# Patient Record
Sex: Female | Born: 1965 | Race: Black or African American | Hispanic: No | Marital: Married | Smoking: Never smoker
Health system: Southern US, Community
[De-identification: ages and names within clinical notes are randomized; demographics above are authoritative.]

## PROBLEM LIST (undated history)

## (undated) VITALS — BP 108/72 | HR 71 | Temp 97.9°F | Resp 16 | Ht 66.5 in | Wt 184.0 lb

## (undated) VITALS — BP 118/85 | HR 75 | Temp 98.1°F | Resp 16 | Ht 66.0 in | Wt 188.0 lb

## (undated) DIAGNOSIS — IMO0001 Reserved for inherently not codable concepts without codable children: Secondary | ICD-10-CM

## (undated) DIAGNOSIS — D649 Anemia, unspecified: Secondary | ICD-10-CM

## (undated) DIAGNOSIS — Z62810 Personal history of physical and sexual abuse in childhood: Secondary | ICD-10-CM

## (undated) DIAGNOSIS — F32A Depression, unspecified: Secondary | ICD-10-CM

## (undated) DIAGNOSIS — F419 Anxiety disorder, unspecified: Secondary | ICD-10-CM

## (undated) DIAGNOSIS — F1020 Alcohol dependence, uncomplicated: Secondary | ICD-10-CM

## (undated) DIAGNOSIS — F329 Major depressive disorder, single episode, unspecified: Secondary | ICD-10-CM

## (undated) DIAGNOSIS — E669 Obesity, unspecified: Secondary | ICD-10-CM

## (undated) DIAGNOSIS — Z9884 Bariatric surgery status: Secondary | ICD-10-CM

## (undated) DIAGNOSIS — J302 Other seasonal allergic rhinitis: Secondary | ICD-10-CM

## (undated) HISTORY — DX: Reserved for inherently not codable concepts without codable children: IMO0001

## (undated) HISTORY — DX: Alcohol dependence, uncomplicated: F10.20

## (undated) HISTORY — DX: Other seasonal allergic rhinitis: J30.2

## (undated) HISTORY — PX: ENDOMETRIAL BIOPSY: SHX622

## (undated) HISTORY — PX: DENTAL SURGERY: SHX609

## (undated) HISTORY — DX: Bariatric surgery status: Z98.84

## (undated) HISTORY — PX: DILATION AND CURETTAGE OF UTERUS: SHX78

## (undated) HISTORY — DX: Personal history of physical and sexual abuse in childhood: Z62.810

## (undated) HISTORY — PX: TUBAL LIGATION: SHX77

## (undated) HISTORY — DX: Obesity, unspecified: E66.9

---

## 1999-09-01 ENCOUNTER — Emergency Department (HOSPITAL_COMMUNITY): Admission: EM | Admit: 1999-09-01 | Discharge: 1999-09-01 | Payer: Self-pay | Admitting: Emergency Medicine

## 2001-12-22 DIAGNOSIS — Z9884 Bariatric surgery status: Secondary | ICD-10-CM

## 2001-12-22 HISTORY — PX: ROUX-EN-Y GASTRIC BYPASS: SHX1104

## 2001-12-22 HISTORY — DX: Bariatric surgery status: Z98.84

## 2002-02-03 ENCOUNTER — Encounter: Payer: Self-pay | Admitting: Internal Medicine

## 2002-02-03 ENCOUNTER — Encounter: Admission: RE | Admit: 2002-02-03 | Discharge: 2002-02-03 | Payer: Self-pay | Admitting: Internal Medicine

## 2004-10-28 ENCOUNTER — Encounter: Admission: RE | Admit: 2004-10-28 | Discharge: 2004-10-28 | Payer: Self-pay | Admitting: Internal Medicine

## 2004-12-19 ENCOUNTER — Other Ambulatory Visit: Admission: RE | Admit: 2004-12-19 | Discharge: 2004-12-19 | Payer: Self-pay | Admitting: Obstetrics and Gynecology

## 2005-11-06 ENCOUNTER — Encounter: Admission: RE | Admit: 2005-11-06 | Discharge: 2005-11-06 | Payer: Self-pay | Admitting: Internal Medicine

## 2006-07-27 ENCOUNTER — Other Ambulatory Visit: Admission: RE | Admit: 2006-07-27 | Discharge: 2006-07-27 | Payer: Self-pay | Admitting: Obstetrics & Gynecology

## 2006-11-09 ENCOUNTER — Encounter: Admission: RE | Admit: 2006-11-09 | Discharge: 2006-11-09 | Payer: Self-pay | Admitting: Internal Medicine

## 2006-11-20 ENCOUNTER — Encounter: Admission: RE | Admit: 2006-11-20 | Discharge: 2006-11-20 | Payer: Self-pay | Admitting: Internal Medicine

## 2007-11-11 ENCOUNTER — Encounter: Admission: RE | Admit: 2007-11-11 | Discharge: 2007-11-11 | Payer: Self-pay | Admitting: Family Medicine

## 2008-05-02 ENCOUNTER — Other Ambulatory Visit: Admission: RE | Admit: 2008-05-02 | Discharge: 2008-05-02 | Payer: Self-pay | Admitting: Obstetrics & Gynecology

## 2008-11-20 ENCOUNTER — Encounter: Admission: RE | Admit: 2008-11-20 | Discharge: 2008-11-20 | Payer: Self-pay | Admitting: Family Medicine

## 2009-11-21 ENCOUNTER — Encounter: Admission: RE | Admit: 2009-11-21 | Discharge: 2009-11-21 | Payer: Self-pay | Admitting: Family Medicine

## 2010-11-22 ENCOUNTER — Encounter: Admission: RE | Admit: 2010-11-22 | Discharge: 2010-11-22 | Payer: Self-pay | Admitting: Internal Medicine

## 2011-01-12 ENCOUNTER — Encounter: Payer: Self-pay | Admitting: Internal Medicine

## 2011-04-14 ENCOUNTER — Inpatient Hospital Stay (HOSPITAL_COMMUNITY)
Admission: AD | Admit: 2011-04-14 | Discharge: 2011-04-18 | DRG: 897 | Disposition: A | Discharge status: Home / Self Care | Payer: Managed Care, Other (non HMO) | Source: Ambulatory Visit | Attending: Psychiatry | Admitting: Psychiatry

## 2011-04-14 ENCOUNTER — Emergency Department (HOSPITAL_COMMUNITY)
Admission: EM | Admit: 2011-04-14 | Discharge: 2011-04-14 | Disposition: A | Discharge status: Other Acute Inpatient Hospital | Payer: Managed Care, Other (non HMO) | Attending: Emergency Medicine | Admitting: Emergency Medicine

## 2011-04-14 DIAGNOSIS — R110 Nausea: Secondary | ICD-10-CM | POA: Insufficient documentation

## 2011-04-14 DIAGNOSIS — F1020 Alcohol dependence, uncomplicated: Secondary | ICD-10-CM

## 2011-04-14 DIAGNOSIS — Z6379 Other stressful life events affecting family and household: Secondary | ICD-10-CM

## 2011-04-14 DIAGNOSIS — F1010 Alcohol abuse, uncomplicated: Secondary | ICD-10-CM | POA: Insufficient documentation

## 2011-04-14 DIAGNOSIS — Z9884 Bariatric surgery status: Secondary | ICD-10-CM | POA: Insufficient documentation

## 2011-04-14 DIAGNOSIS — E669 Obesity, unspecified: Secondary | ICD-10-CM

## 2011-04-14 LAB — RAPID URINE DRUG SCREEN, HOSP PERFORMED
Opiates: NOT DETECTED
Tetrahydrocannabinol: NOT DETECTED

## 2011-04-14 LAB — COMPREHENSIVE METABOLIC PANEL
ALT: 26 U/L (ref 0–35)
AST: 44 U/L — ABNORMAL HIGH (ref 0–37)
Albumin: 3.6 g/dL (ref 3.5–5.2)
Alkaline Phosphatase: 76 U/L (ref 39–117)
GFR calc Af Amer: >60 mL/min (ref >60)
Glucose, Bld: 91 mg/dL (ref 70–99)
Potassium: 4.1 mEq/L (ref 3.5–5.1)
Sodium: 141 mEq/L (ref 135–145)
Total Protein: 7 g/dL (ref 6.0–8.3)

## 2011-04-14 LAB — DIFFERENTIAL
Basophils Absolute: 0.1 10*3/uL (ref 0.0–0.1)
Basophils Relative: 1 % (ref 0–1)
Eosinophils Absolute: 0.1 10*3/uL (ref 0.0–0.7)
Eosinophils Relative: 1 % (ref 0–5)
Lymphocytes Relative: 48 % — ABNORMAL HIGH (ref 12–46)
Lymphs Abs: 3.7 10*3/uL (ref 0.7–4.0)

## 2011-04-14 LAB — CBC
RBC: 4.21 MIL/uL (ref 3.87–5.11)
RDW: 14.3 % (ref 11.5–15.5)

## 2011-04-21 NOTE — Discharge Summary (Signed)
Natalie Mcdaniel, Natalie Mcdaniel              ACCOUNT NO.:  [id number]  MEDICAL RECORD NO.:  [id number]           PATIENT TYPE:  I  LOCATION:  0303                          FACILITY:  [facility]  PHYSICIAN:  Anselm Jungling, MD  DATE OF BIRTH:  03-25-1966  DATE OF ADMISSION:  04/14/2011 DATE OF DISCHARGE:  04/18/2011                              DISCHARGE SUMMARY   IDENTIFYING DATA/REASON FOR ADMISSION:  This was an inpatient psychiatric admission for [place], a 45 year old married African American female who was admitted for treatment of alcohol dependence.  Please refer to the admission note for further details pertaining to the symptoms, circumstances and history that led to her hospitalization. She was given an initial Axis I diagnosis of alcohol dependence.  MEDICAL AND LABORATORY:  The patient was medically and physically assessed by the psychiatric nurse practitioner.  She was in good health without any active or chronic medical problems.  She was continued on Claritin for seasonal allergies.  There were no significant medical issues aside from her medical detoxification from alcohol.  HOSPITAL COURSE:  The patient was admitted to the adult inpatient psychiatric service.  She had presented at the emergency room with an alcohol level 0.297, with urine drug screen negative.  She presented as a well-nourished, normally-developed adult female of above-average intelligence and education.  She readily admitted that alcohol was a problem for her.  She had been on a 5-day drinking binge.  She states that alcohol had been a problem for her over the previous 2 years. Earlier, in her 76s she had an alcohol problem but she stopped drinking at age 52, until about 2 years ago.  She had no prior experience any alcohol rehab program or AA.  She is married with a daughter, and had been working two jobs as an Airline pilot.  She was placed on a Librium withdrawal protocol, and her detoxification was  uneventful.  Actually, on the third hospital day, she declined further doses of Librium, stating that she did not like the way it was making her feel, i.e., too sedated.  Following this, she did not have any further withdrawal symptoms while she was with [place].  She worked closely with case management towards developing an aftercare plan that would support her sobriety and recovery.  She considered the chemical dependency intensive outpatient program, but decided against because it would interfere with her work schedule.  Instead, she made a tentative agreement to meet individually with Charmian Muff, the chemical dependency counselor in our outpatient clinic.  She was appropriate for discharge on the fifth hospital day.  She agreed to the following aftercare plan.  Individual sessions with Charmian Muff, to be arranged.  The patient also encouraged to attend 90 meetings in 90 days.  DISCHARGE MEDICATIONS:  Claritin 10 mg daily.  DISCHARGE DIAGNOSES:  AXIS I: Alcohol dependence. AXIS II: Deferred. AXIS III: Seasonal allergies. AXIS IV: Stressors severe. AXIS V: GAF on discharge 53.     Anselm Jungling, MD     SPB/MEDQ  D:  04/18/2011  T:  04/18/2011  Job:  [id number]  Electronically Signed by Geralyn Flash MD on  04/21/2011 08:56:52 AM

## 2011-04-21 NOTE — H&P (Signed)
Natalie Mcdaniel, Natalie Mcdaniel              ACCOUNT NO.:  [id number]  MEDICAL RECORD NO.:  [id number]           PATIENT TYPE:  I  LOCATION:  0303                          FACILITY:  [facility]  PHYSICIAN:  Anselm Jungling, MD  DATE OF BIRTH:  [birth year]  DATE OF ADMISSION:  04/14/2011 DATE OF DISCHARGE:                      PSYCHIATRIC ADMISSION ASSESSMENT   HPI:  This is a voluntary admission to the services of Dr. Geralyn Flash today, 04/15/2011, for this 45 year old married African-American female, who presented to the ED at [facility], requesting alcohol detox.  She states that over the past few years, her being a wino has increased.  It started out with one or two glasses of wine.  It progressed to a bottle or more every night.  She says that she has been successful stopping for 4 or 5 days at a time.  However, she said she just plain flat out likes to drink.  Sunday, she woke up confused.  She had lost a whole day.  She was yelling at her daughter "Why aren't you in school?" and did not realize that it was Sunday.  She states that she has been under a lot of stress.  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  She also does taxes on the side, and apparently this escalated and just put her over the edge.  She has no formal prior detoxification.  She states that she began drinking at age 22.  She is currently drinking four 1.7 liters bottles 5 days a week for the past month or so.  She states that prior to beginning drinking again two years ago, she had been sober for 15 years.  SOCIAL HISTORY:  She has a BS in Audiological scientist from [employer]. This is her second marriage.  This marriage is 47 years old.  She has two biological daughters, 45 and 56, and a stepson age 30.  Her employment has already been noted.  FAMILY HISTORY:  She states her paternal grandfather was a wino.  He died under a bridge, and her father actually died from complications  of alcoholism, specifically diabetes.  The primary care provider is Dr. Tinnie Gens of [facility].  She has no psychiatric care.  Medical problems are none.  She has been attending the [facility] of a Dr. Mayford Knife on [facility].  She is prescribed Topamax and phentermine for this endeavor.  DRUG ALLERGIES:  No known drug allergies.  POSITIVE PHYSICAL FINDINGS:  She was medically cleared in the ED at [facility].  Her vital signs were stable.  Specifically, she was afebrile at 98 to 98.6.  The pulse was 80 to 102, respirations 18 to 22, and blood pressure was 110/73 to 128/87.  Her alcohol level on admission was 297.  It came down to 200.  Her UDS was negative.  Interestingly enough, her SGOT was only slightly elevated to 44, and she had no abnormalities on CBC.  MENTAL STATUS EXAM:  Today, she is alert and oriented.  She is appropriately groomed, dressed, and nourished in her own clothing.  His speech is of normal rate,  rhythm, and tone.  Her mood is appropriate to the situation.  Thought processes are clear, rational, goal-oriented. She states that she is committed to stopping drinking due to losing a day the other day and being confused.  Judgment and insight are intact. Concentration and memory are intact.  Intelligence is at least average. She specifically denies being suicidal or homicidal.  While she was intoxicated, she reported that she constantly heard music in her head. She could not make it.  She denied suicidal ideation but reported she had attempted it in the past, and there were no details.  The chances are, she was just intoxicated.  Today, she gives no indication at all of responding to internal stimuli, and she has no withdrawal in evidence.  AXIS I:  Alcohol dependence. AXIS II:  Deferred. AXIS III:  Obesity. AXIS IV:  Moderate (occupational, relationship issues). AXIS V:  50.  The patient is interested in Antabuse, and she was told that that  would be discussed in the morning.  Estimated length of stay is 3 to 5 days. She has not taken any of the Librium today.  She said she has refused it, so her level of dependence is different than most.  It is much lighter.     [name], P.A.-C.   ______________________________ Anselm Jungling, MD    MD/MEDQ  D:  04/15/2011  T:  04/15/2011  Job:  [id number]  Electronically Signed by Jaci Lazier ADAMS P.A.-C. on 04/19/2011 03:41:01 PM Electronically Signed by Geralyn Flash MD on 04/21/2011 08:56:50 AM

## 2011-10-23 ENCOUNTER — Other Ambulatory Visit: Payer: Self-pay | Admitting: Internal Medicine

## 2011-10-23 DIAGNOSIS — Z1231 Encounter for screening mammogram for malignant neoplasm of breast: Secondary | ICD-10-CM

## 2011-11-24 ENCOUNTER — Ambulatory Visit
Admission: RE | Admit: 2011-11-24 | Discharge: 2011-11-24 | Disposition: A | Discharge status: Home / Self Care | Payer: Managed Care, Other (non HMO) | Source: Ambulatory Visit | Attending: Internal Medicine | Admitting: Internal Medicine

## 2011-11-24 DIAGNOSIS — Z1231 Encounter for screening mammogram for malignant neoplasm of breast: Secondary | ICD-10-CM

## 2011-11-24 LAB — HM MAMMOGRAPHY: HM Mammogram: NEGATIVE

## 2012-02-05 ENCOUNTER — Ambulatory Visit (INDEPENDENT_AMBULATORY_CARE_PROVIDER_SITE_OTHER): Payer: Managed Care, Other (non HMO) | Admitting: Physician Assistant

## 2012-02-05 ENCOUNTER — Encounter: Payer: Self-pay | Admitting: Physician Assistant

## 2012-02-05 VITALS — BP 128/79 | HR 96 | Temp 98.1°F | Resp 18 | Ht 66.0 in | Wt 188.0 lb

## 2012-02-05 DIAGNOSIS — F32A Depression, unspecified: Secondary | ICD-10-CM | POA: Insufficient documentation

## 2012-02-05 DIAGNOSIS — F418 Other specified anxiety disorders: Secondary | ICD-10-CM

## 2012-02-05 DIAGNOSIS — F1020 Alcohol dependence, uncomplicated: Secondary | ICD-10-CM | POA: Insufficient documentation

## 2012-02-05 DIAGNOSIS — IMO0001 Reserved for inherently not codable concepts without codable children: Secondary | ICD-10-CM | POA: Insufficient documentation

## 2012-02-05 DIAGNOSIS — F329 Major depressive disorder, single episode, unspecified: Secondary | ICD-10-CM | POA: Insufficient documentation

## 2012-02-05 DIAGNOSIS — F339 Major depressive disorder, recurrent, unspecified: Secondary | ICD-10-CM

## 2012-02-05 DIAGNOSIS — F1010 Alcohol abuse, uncomplicated: Secondary | ICD-10-CM

## 2012-02-05 LAB — COMPREHENSIVE METABOLIC PANEL
BUN: 9 mg/dL (ref 6–23)
CO2: 23 mEq/L (ref 19–32)
Calcium: 8.8 mg/dL (ref 8.4–10.5)
Chloride: 105 mEq/L (ref 96–112)
Creat: 0.82 mg/dL (ref 0.50–1.10)
Glucose, Bld: 86 mg/dL (ref 70–99)
Total Bilirubin: 0.3 mg/dL (ref 0.3–1.2)

## 2012-02-05 LAB — CBC WITH DIFFERENTIAL/PLATELET
Basophils Absolute: 0 10*3/uL (ref 0.0–0.1)
Basophils Relative: 1 % (ref 0–1)
HCT: 37 % (ref 36.0–46.0)
Hemoglobin: 12.5 g/dL (ref 12.0–15.0)
Lymphocytes Relative: 42 % (ref 12–46)
Monocytes Absolute: 0.4 10*3/uL (ref 0.1–1.0)
Monocytes Relative: 7 % (ref 3–12)
Neutro Abs: 3 10*3/uL (ref 1.7–7.7)
Neutrophils Relative %: 51 % (ref 43–77)
WBC: 5.9 10*3/uL (ref 4.0–10.5)

## 2012-02-05 LAB — TSH: TSH: 1.182 u[IU]/mL (ref 0.350–4.500)

## 2012-02-05 LAB — LIPID PANEL
Cholesterol: 276 mg/dL — ABNORMAL HIGH (ref 0–200)
HDL: 157 mg/dL (ref >39)
LDL Cholesterol: 99 mg/dL (ref 0–99)
Triglycerides: 101 mg/dL (ref <150)
VLDL: 20 mg/dL (ref 0–40)

## 2012-02-05 MED ORDER — ALPRAZOLAM 0.5 MG PO TBDP: ORAL_TABLET | ORAL | Status: DC

## 2012-02-05 MED ORDER — SERTRALINE HCL 50 MG PO TABS: 50.0000 mg | ORAL_TABLET | Freq: Every day | ORAL | Status: DC
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
## 2012-02-05 NOTE — Patient Instructions (Signed)
Call and schedule with your therapist. Find an AA meeting to attend TODAY.  Attend as many meetings each day as you need to stay sober.

## 2012-02-06 ENCOUNTER — Telehealth: Payer: Self-pay

## 2012-02-06 NOTE — Telephone Encounter (Signed)
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

## 2012-02-06 NOTE — Telephone Encounter (Signed)
Cell phone only  FLMA HAS TO BE FAXED TODAY TO EMPLOYER

## 2012-02-07 NOTE — Telephone Encounter (Signed)
This needs to be done by [facility]. It is on her desk to be completed. Advise pt it may take a few days.

## 2012-02-10 ENCOUNTER — Encounter: Payer: Self-pay | Admitting: *Deleted

## 2012-02-10 ENCOUNTER — Encounter: Payer: Self-pay | Admitting: Physician Assistant

## 2012-02-10 DIAGNOSIS — Z0271 Encounter for disability determination: Secondary | ICD-10-CM

## 2012-02-11 ENCOUNTER — Encounter: Payer: Self-pay | Admitting: Family Medicine

## 2012-02-11 ENCOUNTER — Ambulatory Visit (INDEPENDENT_AMBULATORY_CARE_PROVIDER_SITE_OTHER): Payer: Managed Care, Other (non HMO) | Admitting: Family Medicine

## 2012-02-11 VITALS — BP 114/71 | HR 68 | Temp 97.8°F | Resp 16 | Ht 65.75 in | Wt 188.6 lb

## 2012-02-11 DIAGNOSIS — Z79899 Other long term (current) drug therapy: Secondary | ICD-10-CM

## 2012-02-11 DIAGNOSIS — F419 Anxiety disorder, unspecified: Secondary | ICD-10-CM

## 2012-02-11 DIAGNOSIS — G4700 Insomnia, unspecified: Secondary | ICD-10-CM

## 2012-02-11 DIAGNOSIS — T887XXA Unspecified adverse effect of drug or medicament, initial encounter: Secondary | ICD-10-CM

## 2012-02-11 DIAGNOSIS — F411 Generalized anxiety disorder: Secondary | ICD-10-CM

## 2012-02-11 MED ORDER — ALPRAZOLAM 0.5 MG PO TBDP: ORAL_TABLET | ORAL | Status: DC

## 2012-02-11 NOTE — Patient Instructions (Signed)
Insomnia Insomnia is frequent trouble falling and/or staying asleep. Insomnia can be a long term problem or a short term problem. Both are common. Insomnia can be a short term problem when the wakefulness is related to a certain stress or worry. Long term insomnia is often related to ongoing stress during waking hours and/or poor sleeping habits. Overtime, sleep deprivation itself can make the problem worse. Every little thing feels more severe because you are overtired and your ability to cope is decreased. CAUSES   Stress, anxiety, and depression.   Poor sleeping habits.   Distractions such as TV in the bedroom.   Naps close to bedtime.   Engaging in emotionally charged conversations before bed.   Technical reading before sleep.   Alcohol and other sedatives. They may make the problem worse. They can hurt normal sleep patterns and normal dream activity.   Stimulants such as caffeine for several hours prior to bedtime.   Pain syndromes and shortness of breath can cause insomnia.   Exercise late at night.   Changing time zones may cause sleeping problems (jet lag).  It is sometimes helpful to have someone observe your sleeping patterns. They should look for periods of not breathing during the night (sleep apnea). They should also look to see how long those periods last. If you live alone or observers are uncertain, you can also be observed at a sleep clinic where your sleep patterns will be professionally monitored. Sleep apnea requires a checkup and treatment. Give your caregivers your medical history. Give your caregivers observations your family has made about your sleep.  SYMPTOMS   Not feeling rested in the morning.   Anxiety and restlessness at bedtime.   Difficulty falling and staying asleep.  TREATMENT   Your caregiver may prescribe treatment for an underlying medical disorders. Your caregiver can give advice or help if you are using alcohol or other drugs for  self-medication. Treatment of underlying problems will usually eliminate insomnia problems.   Medications can be prescribed for short time use. They are generally not recommended for lengthy use.   Over-the-counter sleep medicines are not recommended for lengthy use. They can be habit forming.   You can promote easier sleeping by making lifestyle changes such as:   Using relaxation techniques that help with breathing and reduce muscle tension.   Exercising earlier in the day.   Changing your diet and the time of your last meal. No night time snacks.   Establish a regular time to go to bed.   Counseling can help with stressful problems and worry.   Soothing music and white noise may be helpful if there are background noises you cannot remove.   Stop tedious detailed work at least one hour before bedtime.  HOME CARE INSTRUCTIONS   Keep a diary. Inform your caregiver about your progress. This includes any medication side effects. See your caregiver regularly. Take note of:   Times when you are asleep.   Times when you are awake during the night.   The quality of your sleep.   How you feel the next day.  This information will help your caregiver care for you.  Get out of bed if you are still awake after 15 minutes. Read or do some quiet activity. Keep the lights down. Wait until you feel sleepy and go back to bed.   Keep regular sleeping and waking hours. Avoid naps.   Exercise regularly.   Avoid distractions at bedtime. Distractions include watching television or engaging   in any intense or detailed activity like attempting to balance the household checkbook.   Develop a bedtime ritual. Keep a familiar routine of bathing, brushing your teeth, climbing into bed at the same time each night, listening to soothing music. Routines increase the success of falling to sleep faster.   Use relaxation techniques. This can be using breathing and muscle tension release routines. It can  also include visualizing peaceful scenes. You can also help control troubling or intruding thoughts by keeping your mind occupied with boring or repetitive thoughts like the old concept of counting sheep. You can make it more creative like imagining planting one beautiful flower after another in your backyard garden.   During your day, work to eliminate stress. When this is not possible use some of the previous suggestions to help reduce the anxiety that accompanies stressful situations.  MAKE SURE YOU:   Understand these instructions.   Will watch your condition.   Will get help right away if you are not doing well or get worse.  Document Released: 12/05/2000 Document Revised: 08/20/2011 Document Reviewed: 01/05/2008 [facility] Patient Information 2012 [place], [place]   .Anxiety and Panic Attacks Your caregiver has informed you that you are having an anxiety or panic attack. There may be many forms of this. Most of the time these attacks come suddenly and without warning. They come at any time of day, including periods of sleep, and at any time of life. They may be strong and unexplained. Although panic attacks are very scary, they are physically harmless. Sometimes the cause of your anxiety is not known. Anxiety is a protective mechanism of the body in its fight or flight mechanism. Most of these perceived danger situations are actually nonphysical situations (such as anxiety over losing a job). CAUSES  The causes of an anxiety or panic attack are many. Panic attacks may occur in otherwise healthy people given a certain set of circumstances. There may be a genetic cause for panic attacks. Some medications may also have anxiety as a side effect. SYMPTOMS  Some of the most common feelings are:  Intense terror.   Dizziness, feeling faint.   Hot and cold flashes.   Fear of going crazy.   Feelings that nothing is real.   Sweating.   Shaking.   Chest pain or a fast heartbeat  (palpitations).   Smothering, choking sensations.   Feelings of impending doom and that death is near.   Tingling of extremities, this may be from over-breathing.   Altered reality (derealization).   Being detached from yourself (depersonalization).  Several symptoms can be present to make up anxiety or panic attacks. DIAGNOSIS  The evaluation by your caregiver will depend on the type of symptoms you are experiencing. The diagnosis of anxiety or panic attack is made when no physical illness can be determined to be a cause of the symptoms. TREATMENT  Treatment to prevent anxiety and panic attacks may include:  Avoidance of circumstances that cause anxiety.   Reassurance and relaxation.   Regular exercise.   Relaxation therapies, such as yoga.   Psychotherapy with a psychiatrist or therapist.   Avoidance of caffeine, alcohol and illegal drugs.   Prescribed medication.  SEEK IMMEDIATE MEDICAL CARE IF:   You experience panic attack symptoms that are different than your usual symptoms.   You have any worsening or concerning symptoms.  Document Released: 12/08/2005 Document Revised: 08/20/2011 Document Reviewed: 04/11/2010 [facility] Patient Information 2012 [place], [place].

## 2012-02-12 ENCOUNTER — Encounter: Payer: Self-pay | Admitting: Physician Assistant

## 2012-02-12 NOTE — Progress Notes (Signed)
  Subjective:    Patient ID: Natalie Mcdaniel, female    DOB: 1966/12/03, 46 y.o.   MRN: [id number]  HPI  This 46 y.o Cauc female is here today for med RF (Alprazolam) which was prescribed to help  her sleep. She was last seen here on 02/05/12 by Porfirio Oar, PA-C who is addressing issues  pertaining to alcohol abuse and depression. She states the sleep medication is very helpful.    Review of Systems Noncontributory     Objective:   Physical Exam  Vitals reviewed. Constitutional: She is oriented to person, place, and time. She appears well-developed and well-nourished. No distress.  HENT:  Head: Normocephalic and atraumatic.  Eyes: EOM are normal. No scleral icterus.  Pulmonary/Chest: Effort normal. No respiratory distress.  Neurological: She is alert and oriented to person, place, and time. Coordination normal.  Psychiatric: She has a normal mood and affect. Her behavior is normal. Thought content normal.          Assessment & Plan:   1. Anxiety disorder                          RF: Alprazolam 0.5 1 tablet hs prn; suggested pt try 1/2 tab hs                                                                To see if she has good sleep without "hangover" effect  2. Insomnia                                        As above  3. Medication side effect                    Medication dose reduction as noted above

## 2012-02-13 LAB — HM PAP SMEAR: HM Pap smear: NORMAL

## 2012-02-27 ENCOUNTER — Ambulatory Visit: Payer: Managed Care, Other (non HMO) | Admitting: Family Medicine

## 2012-03-04 ENCOUNTER — Encounter: Payer: Self-pay | Admitting: Physician Assistant

## 2012-03-04 ENCOUNTER — Ambulatory Visit (INDEPENDENT_AMBULATORY_CARE_PROVIDER_SITE_OTHER): Payer: Managed Care, Other (non HMO) | Admitting: Physician Assistant

## 2012-03-04 VITALS — BP 109/66 | HR 73 | Temp 98.1°F | Resp 16 | Ht 66.0 in | Wt 190.0 lb

## 2012-03-04 DIAGNOSIS — F1010 Alcohol abuse, uncomplicated: Secondary | ICD-10-CM

## 2012-03-04 DIAGNOSIS — F329 Major depressive disorder, single episode, unspecified: Secondary | ICD-10-CM

## 2012-03-04 NOTE — Patient Instructions (Signed)
Please make every effort to attend AA meetings to help you stay sober.  Please re-start the sertraline (Zoloft).

## 2012-03-04 NOTE — Progress Notes (Signed)
  Subjective:    Patient ID: Natalie Mcdaniel, female    DOB: [birth year], 46 y.o.   MRN: [id number]  HPI  Presents for follow-up of depression and alcoholism after restarting sertraline and alprazolam.  She reports that since her last visit, her only alcohol consumption happened this past weekend (02/28/2012). She started the sertraline 03/02/12 and only took one dose after she experienced swelling in her feet that lasted several days.  She reports it improved with elevation and with going to the gym two days in a row.  She has attended no AA meetings since her last visit because she doesn't have time, she works two jobs, and she just can't make herself go.   Has worked from home x 8 years, at this job x 18 years.  STD denied for the week of work she missed last month when her drinking got out of hand, but FMLA was authorized for episodic absences.  She feels like she's being punished by her immediate supervisor (of only 2 weeks) for her absence and who is trying to make her return to the office for work.  Needs workplace accomodation forms completed so she may continue working from home.  Review of Systems As above.    Objective:   Physical Exam  Vital signs noted. Well-developed, well nourished BF who is awake, alert and oriented, in NAD. Seems a little anxious. HEENT: [place]/AT, PERRL, EOMI.  Sclera and conjunctiva are clear.  Neck: supple, non-tender, no lymphadenopathey, thyromegaly. Heart: RRR, no murmur Lungs: CTA Skin: warm and dry without rash.       Assessment & Plan:  Depression. Alcoholism.  Encouraged to restart the sertraline, as I doubt it caused her LE edema.  Also encouraged attendance at [facility] meetings, and that she make the time for it.  She has refills of Alprazolam and sertraline.  Re-evaluate in 4 weeks.

## 2012-03-19 ENCOUNTER — Telehealth: Payer: Self-pay

## 2012-03-19 NOTE — Telephone Encounter (Signed)
If the forms are not complete, then we need to complete them.

## 2012-03-19 NOTE — Telephone Encounter (Signed)
FOR CHELLE: SARAH FROM THE DISABILITY PLACE WOULD LIKE TO SPEAK WITH YOU REGARDING PT'S DISABILITY. WOULD LIKE TO KNOW WHY IT IS MEDICALLY NECESSARY FOR PT TO WORK FROM HOME PLEASE CALL [phone] OR HER CELL AT [phone]

## 2012-03-19 NOTE — Telephone Encounter (Signed)
Chelle: Pt would like to speak with you regarding her work from home accomodation  As well as her medical condition. 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

## 2012-03-23 NOTE — Telephone Encounter (Signed)
Natalie Mcdaniel, is this something you can check on to see if something else needs to be filled out on pt's disability forms?

## 2012-03-30 ENCOUNTER — Telehealth: Payer: Self-pay
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
## 2012-03-30 NOTE — Telephone Encounter (Signed)
FOR CHELLE: SARAH FROM [employer] CALLED AGAIN TODAY, HADN'T HEARD FROM YOU AND REALLY WOULD LIKE TO SPEAK WITH YOU REGARDING PT'S NEED TO WORK FROM HOME

## 2012-03-31 NOTE — Telephone Encounter (Signed)
Patient notified and plans to recheck tomorrow am to see Chelle.

## 2012-04-01 ENCOUNTER — Ambulatory Visit: Payer: Managed Care, Other (non HMO)

## 2012-04-01 ENCOUNTER — Telehealth: Payer: Self-pay

## 2012-04-08 ENCOUNTER — Ambulatory Visit (INDEPENDENT_AMBULATORY_CARE_PROVIDER_SITE_OTHER): Payer: Managed Care, Other (non HMO) | Admitting: Physician Assistant

## 2012-04-08 ENCOUNTER — Encounter: Payer: Self-pay | Admitting: Physician Assistant

## 2012-04-08 VITALS — BP 125/78 | HR 66 | Temp 97.9°F | Resp 16 | Ht 65.25 in | Wt 186.5 lb

## 2012-04-08 DIAGNOSIS — F341 Dysthymic disorder: Secondary | ICD-10-CM

## 2012-04-08 DIAGNOSIS — F1020 Alcohol dependence, uncomplicated: Secondary | ICD-10-CM

## 2012-04-08 DIAGNOSIS — F419 Anxiety disorder, unspecified: Secondary | ICD-10-CM

## 2012-04-08 DIAGNOSIS — F329 Major depressive disorder, single episode, unspecified: Secondary | ICD-10-CM
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
## 2012-04-21 ENCOUNTER — Encounter (HOSPITAL_COMMUNITY): Payer: Self-pay

## 2012-04-21 ENCOUNTER — Inpatient Hospital Stay (HOSPITAL_COMMUNITY)
Admission: AD | Admit: 2012-04-21 | Discharge: 2012-04-25 | DRG: 897 | Disposition: A | Discharge status: Home / Self Care | Payer: Managed Care, Other (non HMO) | Source: Ambulatory Visit | Attending: Psychiatry | Admitting: Psychiatry

## 2012-04-21 ENCOUNTER — Emergency Department (HOSPITAL_COMMUNITY)
Admission: EM | Admit: 2012-04-21 | Discharge: 2012-04-21 | Disposition: A | Discharge status: Other Acute Inpatient Hospital | Payer: Managed Care, Other (non HMO) | Attending: Emergency Medicine | Admitting: Emergency Medicine

## 2012-04-21 ENCOUNTER — Encounter (HOSPITAL_COMMUNITY): Payer: Self-pay | Admitting: *Deleted

## 2012-04-21 DIAGNOSIS — F1020 Alcohol dependence, uncomplicated: Principal | ICD-10-CM | POA: Diagnosis present

## 2012-04-21 DIAGNOSIS — F329 Major depressive disorder, single episode, unspecified: Secondary | ICD-10-CM | POA: Insufficient documentation

## 2012-04-21 DIAGNOSIS — F411 Generalized anxiety disorder: Secondary | ICD-10-CM | POA: Insufficient documentation

## 2012-04-21 DIAGNOSIS — IMO0002 Reserved for concepts with insufficient information to code with codable children: Secondary | ICD-10-CM

## 2012-04-21 DIAGNOSIS — F1010 Alcohol abuse, uncomplicated: Secondary | ICD-10-CM | POA: Insufficient documentation

## 2012-04-21 DIAGNOSIS — F3289 Other specified depressive episodes: Secondary | ICD-10-CM | POA: Insufficient documentation

## 2012-04-21 HISTORY — DX: Depression, unspecified: F32.A

## 2012-04-21 HISTORY — DX: Major depressive disorder, single episode, unspecified: F32.9

## 2012-04-21 HISTORY — DX: Anxiety disorder, unspecified: F41.9

## 2012-04-21 LAB — COMPREHENSIVE METABOLIC PANEL
BUN: 11 mg/dL (ref 6–23)
CO2: 28 mEq/L (ref 19–32)
Calcium: 8.8 mg/dL (ref 8.4–10.5)
Chloride: 95 mEq/L — ABNORMAL LOW (ref 96–112)
Creatinine, Ser: 0.86 mg/dL (ref 0.50–1.10)
GFR calc Af Amer: >90 mL/min (ref >90)
GFR calc non Af Amer: 79 mL/min — ABNORMAL LOW (ref >90)
Total Bilirubin: 0.3 mg/dL (ref 0.3–1.2)

## 2012-04-21 LAB — RAPID URINE DRUG SCREEN, HOSP PERFORMED
Cocaine: NOT DETECTED
Opiates: NOT DETECTED
Tetrahydrocannabinol: NOT DETECTED

## 2012-04-21 LAB — CBC
HCT: 39 % (ref 36.0–46.0)
MCH: 31.8 pg (ref 26.0–34.0)
MCV: 92.6 fL (ref 78.0–100.0)
Platelets: 418 10*3/uL — ABNORMAL HIGH (ref 150–400)
RBC: 4.21 MIL/uL (ref 3.87–5.11)
WBC: 6.5 10*3/uL (ref 4.0–10.5)

## 2012-04-21 LAB — ETHANOL
Alcohol, Ethyl (B): 232 mg/dL — ABNORMAL HIGH (ref 0–11)
Alcohol, Ethyl (B): 154 mg/dL — ABNORMAL HIGH (ref 0–11)

## 2012-04-21 MED ORDER — IBUPROFEN 600 MG PO TABS: 600.0000 mg | ORAL_TABLET | Freq: Three times a day (TID) | ORAL | Status: DC | PRN

## 2012-04-21 MED ORDER — TRAZODONE HCL 50 MG PO TABS
50.0000 mg | ORAL_TABLET | Freq: Every evening | ORAL | Status: DC | PRN
  Administered 2012-04-21 – 2012-04-24 (×4): 50 mg via ORAL
  Filled 2012-04-21 (×3): qty 1

## 2012-04-21 MED ORDER — ONDANSETRON HCL 4 MG PO TABS: 4.0000 mg | ORAL_TABLET | Freq: Three times a day (TID) | ORAL | Status: DC | PRN

## 2012-04-21 MED ORDER — VITAMIN B-1 100 MG PO TABS
100.0000 mg | ORAL_TABLET | Freq: Every day | ORAL | Status: DC
Start: 2012-04-21 — End: 2012-04-21
  Administered 2012-04-21: 100 mg via ORAL
  Filled 2012-04-21: qty 1

## 2012-04-21 MED ORDER — ALUM & MAG HYDROXIDE-SIMETH 200-200-20 MG/5ML PO SUSP: 30.0000 mL | ORAL | Status: DC | PRN

## 2012-04-21 MED ORDER — MAGNESIUM HYDROXIDE 400 MG/5ML PO SUSP
30.0000 mL | Freq: Every day | ORAL | Status: DC | PRN
  Administered 2012-04-25: 30 mL via ORAL

## 2012-04-21 MED ORDER — VITAMIN B-1 100 MG PO TABS
100.0000 mg | ORAL_TABLET | Freq: Every day | ORAL | Status: DC
  Administered 2012-04-22 – 2012-04-25 (×4): 100 mg via ORAL
  Filled 2012-04-21 (×6): qty 1

## 2012-04-21 MED ORDER — CHLORDIAZEPOXIDE HCL 25 MG PO CAPS
25.0000 mg | ORAL_CAPSULE | ORAL | Status: AC
  Administered 2012-04-24 – 2012-04-25 (×2): 25 mg via ORAL
  Filled 2012-04-21 (×2): qty 1

## 2012-04-21 MED ORDER — THIAMINE HCL 100 MG/ML IJ SOLN: 100.0000 mg | Freq: Every day | INTRAMUSCULAR | Status: DC

## 2012-04-21 MED ORDER — CHLORDIAZEPOXIDE HCL 25 MG PO CAPS
25.0000 mg | ORAL_CAPSULE | Freq: Three times a day (TID) | ORAL | Status: AC
  Administered 2012-04-23 (×2): 25 mg via ORAL
  Filled 2012-04-21 (×4): qty 1

## 2012-04-21 MED ORDER — HYDROXYZINE HCL 25 MG PO TABS: 25.0000 mg | ORAL_TABLET | Freq: Four times a day (QID) | ORAL | Status: AC | PRN

## 2012-04-21 MED ORDER — CHLORDIAZEPOXIDE HCL 25 MG PO CAPS
25.0000 mg | ORAL_CAPSULE | Freq: Four times a day (QID) | ORAL | Status: AC
  Administered 2012-04-21 – 2012-04-23 (×6): 25 mg via ORAL
  Filled 2012-04-21 (×6): qty 1

## 2012-04-21 MED ORDER — ADULT MULTIVITAMIN W/MINERALS CH
1.0000 | ORAL_TABLET | Freq: Every day | ORAL | Status: DC
  Administered 2012-04-21 – 2012-04-25 (×5): 1 via ORAL
  Filled 2012-04-21 (×9): qty 1

## 2012-04-21 MED ORDER — ONDANSETRON 4 MG PO TBDP: 4.0000 mg | ORAL_TABLET | Freq: Four times a day (QID) | ORAL | Status: AC | PRN

## 2012-04-21 MED ORDER — LORAZEPAM 1 MG PO TABS
1.0000 mg | ORAL_TABLET | Freq: Four times a day (QID) | ORAL | Status: DC | PRN
  Administered 2012-04-21: 1 mg via ORAL
  Filled 2012-04-21: qty 1

## 2012-04-21 MED ORDER — LORAZEPAM 1 MG PO TABS
0.0000 mg | ORAL_TABLET | Freq: Four times a day (QID) | ORAL | Status: DC
  Administered 2012-04-21: 1 mg via ORAL
  Filled 2012-04-21: qty 1

## 2012-04-21 MED ORDER — LOPERAMIDE HCL 2 MG PO CAPS: 2.0000 mg | ORAL_CAPSULE | ORAL | Status: AC | PRN

## 2012-04-21 MED ORDER — LORAZEPAM 2 MG/ML IJ SOLN: 1.0000 mg | Freq: Four times a day (QID) | INTRAMUSCULAR | Status: DC | PRN

## 2012-04-21 MED ORDER — LORAZEPAM 1 MG PO TABS: 0.0000 mg | ORAL_TABLET | Freq: Two times a day (BID) | ORAL | Status: DC

## 2012-04-21 MED ORDER — THIAMINE HCL 100 MG/ML IJ SOLN: 100.0000 mg | Freq: Once | INTRAMUSCULAR | Status: DC

## 2012-04-21 MED ORDER — CHLORDIAZEPOXIDE HCL 25 MG PO CAPS: 25.0000 mg | ORAL_CAPSULE | Freq: Every day | ORAL | Status: DC

## 2012-04-21 MED ORDER — FOLIC ACID 1 MG PO TABS
1.0000 mg | ORAL_TABLET | Freq: Every day | ORAL | Status: DC
  Administered 2012-04-21: 1 mg via ORAL
  Filled 2012-04-21: qty 1

## 2012-04-21 MED ORDER — CHLORDIAZEPOXIDE HCL 25 MG PO CAPS: 25.0000 mg | ORAL_CAPSULE | Freq: Four times a day (QID) | ORAL | Status: AC | PRN

## 2012-04-21 MED ORDER — ACETAMINOPHEN 325 MG PO TABS: 650.0000 mg | ORAL_TABLET | Freq: Four times a day (QID) | ORAL | Status: DC | PRN

## 2012-04-21 MED ORDER — ADULT MULTIVITAMIN W/MINERALS CH
1.0000 | ORAL_TABLET | Freq: Every day | ORAL | Status: DC
  Administered 2012-04-21: 1 via ORAL
  Filled 2012-04-21: qty 1

## 2012-04-21 MED ORDER — ZOLPIDEM TARTRATE 5 MG PO TABS: 5.0000 mg | ORAL_TABLET | Freq: Every evening | ORAL | Status: DC | PRN

## 2012-04-21 NOTE — ED Notes (Signed)
Pt states that she started drinking about 2 weeks after being dry for one week. Pt had Budlight mixed with a V8,  22 oz(chilada) at 8:30 am 04/21/12. Pt states that she needs to have a drink when she wakes up each morning. Pt does not know when the last time she ate.

## 2012-04-21 NOTE — ED Notes (Signed)
Pt complains of anxiety says she feels shakey.

## 2012-04-21 NOTE — ED Provider Notes (Signed)
The patient has been accepted to behavioral health  Dayton Bailiff, MD 04/21/12 1843

## 2012-04-21 NOTE — ED Notes (Signed)
Pt has been on a drinking binge x 14 days and now wants help with detox from etoh.  Denies SI/HI.  Already accepted at [facility] waiting for bed assignment/availibility.
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
## 2012-04-21 NOTE — ED Notes (Signed)
Security wanded patient and belongings. 2 bags of belongings labeled and placed behind nursing station beside room 15.
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
## 2012-04-21 NOTE — Progress Notes (Signed)
Vol admit to the 300 hall requesting detox from alcohol.  Pt reports drinking a 12 pack and (4) 40 oz malt liquors daily.  She states has been on a 2 week binge.  Prior she had been sober for about a week.  She says she does not use any other drugs(UDS was negative).  She denies SI/HI.  Pt states she just wants help getting off the alcohol.  Pt had gastric bypass surgery in 2003.  Pt has no major medical issues.  She states she is hypertensive when detoxing.  Her BP was elevated on admission.  She was pleasant/cooperative during the admission.  She complains of her skin tingling.  Orders obtained to start Librium Protocol.  Safety maintained with q15 minute checks.

## 2012-04-21 NOTE — ED Notes (Addendum)
Pt accepted by Lynann Bologna, NP to [facility] ([phone]). Completed support documentation and updated EDP.

## 2012-04-21 NOTE — Tx Team (Signed)
Initial Interdisciplinary Treatment Plan  PATIENT STRENGTHS: (choose at least two) Average or above average intelligence Capable of independent living General fund of knowledge Motivation for treatment/growth Supportive family/friends  PATIENT STRESSORS: Marital or family conflict Substance abuse   PROBLEM LIST: Problem List/Patient Goals Date to be addressed Date deferred Reason deferred Estimated date of resolution  Alcohol abuse      Marital conflict      Depression                                           DISCHARGE CRITERIA:  Ability to meet basic life and health needs Improved stabilization in mood, thinking, and/or behavior Motivation to continue treatment in a less acute level of care Verbal commitment to aftercare and medication compliance Withdrawal symptoms are absent or subacute and managed without 24-hour nursing intervention  PRELIMINARY DISCHARGE PLAN: Attend 12-step recovery group Outpatient therapy Participate in family therapy Return to previous living arrangement  PATIENT/FAMIILY INVOLVEMENT: This treatment plan has been presented to and reviewed with the patient, Natalie Mcdaniel, and/or family member.  The patient and family have been given the opportunity to ask questions and make suggestions.  Jesus Genera [facility] 04/21/2012, 8:04 PM

## 2012-04-22 DIAGNOSIS — F1020 Alcohol dependence, uncomplicated: Principal | ICD-10-CM

## 2012-04-22 MED ORDER — PANTOPRAZOLE SODIUM 40 MG PO TBEC
40.0000 mg | DELAYED_RELEASE_TABLET | Freq: Every day | ORAL | Status: DC
  Administered 2012-04-22 – 2012-04-25 (×4): 40 mg via ORAL
  Filled 2012-04-22 (×6): qty 1

## 2012-04-22 MED ORDER — GATORADE (BH)
240.0000 mL | Freq: Four times a day (QID) | ORAL | Status: DC
  Administered 2012-04-22 – 2012-04-25 (×9): 240 mL via ORAL
  Filled 2012-04-22: qty 480
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
## 2012-04-22 NOTE — Progress Notes (Signed)
Pt.  Isolative.  Sitting in day room putting together a puzzle.  Minimal interaction with peers.  Denies SI/HI and denies A/V hallucinations.  Support given.

## 2012-04-22 NOTE — Progress Notes (Signed)
[facility] Group Notes:  (Counselor/Nursing/MHT/Case Management/Adjunct)  04/22/2012 4:59 PM  Type of Therapy:  Group Therapy  Participation Level:  Active  Participation Quality:  Appropriate  Affect:  Appropriate  Cognitive:  Alert and Oriented  Insight:  Limited  Engagement in Group:  Limited  Engagement in Therapy:  Limited  Modes of Intervention:  Clarification and Socialization  Summary of Progress/Problems:  Natalie Mcdaniel participated in activity in which patients choose photographs to represent what their life would look and feel like were it in balance and another for out of balance. The phot she chose to represent life out of balance shoes a person looking down with a head covered in sticky notes to which patient shared about being responsible for everything in home.  Another photo showed a flower opening to represent balance in life.    [name] 04/22/2012, 5:04 PM

## 2012-04-22 NOTE — Discharge Planning (Signed)
New patient is here for detox from alcohol.  Been through detox once before here 2 yrs ago.  Relapsed about a month after that.  Quit going to meetings.  Previous to that she had put together 10 years of sobriety with the help of mtgs and sponsor.  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  Plans to return home, follow up with mtgs.
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
## 2012-04-22 NOTE — [facility] Suicide Risk Assessment (Signed)
Suicide Risk Assessment  Admission Assessment     Demographic factors:  Assessment Details Time of Assessment: Admission Information Obtained From: Patient Current Mental Status:  Current Mental Status:  (n/a) Loss Factors:  Loss Factors: Financial problems / change in socioeconomic status Historical Factors:  Historical Factors:  (substance abuse) Risk Reduction Factors:  Risk Reduction Factors: Responsible for children under 48 years of age;Sense of responsibility to family;Positive social support;Living with another person, especially a relative  CLINICAL FACTORS:   Depression:   Anhedonia Comorbid alcohol abuse/dependence Hopelessness Insomnia Severe Dysthymia Alcohol/Substance Abuse/Dependencies More than one psychiatric diagnosis Previous Psychiatric Diagnoses and Treatments  COGNITIVE FEATURES THAT CONTRIBUTE TO RISK:  Thought constriction (tunnel vision)    SUICIDE RISK:   Minimal: No identifiable suicidal ideation.  Patients presenting with no risk factors but with morbid ruminations; may be classified as minimal risk based on the severity of the depressive symptoms  PLAN OF CARE: Pt has been drinking beer for two week binge.  These binges are repetitive.  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  She just finished and went on recent binge.  She has a flat affect, some tears and says she just wanted to stop drinking.  She has been in [facility] before and did not have a long period of sobriety before she began binge drinking again.she tries to stop for ~ 4 days and the withdrawal symptoms begin and she has to start drinking.  She has a 40 yo daughter at home who 'fends for herself' when pt [mom] is drinking.  Pt will participate in groups and individual therapy.  She will report any severe withdrawal symptoms., any suicidal thoughts, and any adverse reactions to medications.  She needs a soft diet because she has not been eating for two weeks and became nauseous after  trying to eat lunch.  She will need referral to outpatient psychiatrist /rehab program to renforce intentions of sobriety once ready for discharge  Natalie Mcdaniel 04/22/2012, 3:15 PM

## 2012-04-22 NOTE — Progress Notes (Signed)
Patient ID: Natalie Mcdaniel, female   DOB: 1965-12-27, 46 y.o.   MRN: [id number] Pt. Awake, alert, NAD.  AFfect is bright, mood is stable, though slightly depressed.  Reviewed nursing care plan.  Pt. Denies SI/HI.  States she saw a visual hallucination last night, a ghost.  The hallucination resolved when she turned on the bathroom light and left it on.  She reports pins/needles withdrawal symptoms.  She has been attending afternoon groups.

## 2012-04-23 NOTE — Progress Notes (Signed)
Pt quiet but pleasant on approach, sitting in dayroom putting puzzle together.  No complaints voiced.  Pt denies SI/HI/hallucinations.  Support and encouragement offered, will continue to monitor.

## 2012-04-23 NOTE — Progress Notes (Signed)
[facility] Group Notes:  (Counselor/Nursing/MHT/Case Management/Adjunct)  04/23/2012 4:24 PM  Type of Therapy:  Group Therapy at 11am  Participation Level:  Active  Participation Quality:  Appropriate, Attentive and Sharing  Affect:  Appropriate  Cognitive:  Appropriate  Insight:  Good  Engagement in Group:  Good  Engagement in Therapy:  Good  Modes of Intervention:  Education and Support  Summary of Progress/Problems:  Denise was attentive to discussion on Post Acute Withdrawal Syndrome (PAWS) although somewhat reactive .    [name] 04/23/2012, 4:24 PMBHH Group Notes:  (Counselor/Nursing/MHT/Case Management/Adjunct)  04/23/2012 4:24 PM  Type of Therapy:  Group Therapy at 1:15  Participation Level:  Active  Participation Quality:  Appropriate  Affect:  Appropriate  Cognitive:  Appropriate  Insight:  Good  Engagement in Group:  Good  Engagement in Therapy:  Good  Modes of Intervention:  Clarification, Support and Exploration  Summary of Progress/Problems:  Sabriyah shared that she neither uses alcohol to relieve tension or increase excitement as she is "caught in cycle to have first drink to get rid of the last."  Arieliz was supportive of others who willingly shared their truth. Aysha also told group she is excited about beginning IOP program next week.     [name] 04/23/2012, 4:38 PM

## 2012-04-23 NOTE — Progress Notes (Signed)
Slept good w/meds lasst nite, appetite is improving, energy level is normal, ability to pay attention is good, depressed 3/10 and hopeless 1/10, denies SI or HI, attending group and interacting w/peers in the dayroom, eating in the DR and taking meds as ordered by MD. q32min safety checks continues and support offered Safety maintained

## 2012-04-23 NOTE — Treatment Plan (Signed)
Interdisciplinary Treatment Plan Update (Adult)  Date: 04/23/2012  Time Reviewed: 8:27 AM   Progress in Treatment: Attending groups: Yes Participating in groups: Yes Taking medication as prescribed: Yes Tolerating medication: Yes   Family/Significant other contact made:  No Patient understands diagnosis:  Yes  As evidenced by asking for help with alcohol withdrawal Discussing patient identified problems/goals with staff:  Yes See below Medical problems stabilized or resolved:  Yes Denies suicidal/homicidal ideation: Yes In AM group Issues/concerns per patient self-inventory:  Yes  Some diarrhea Other:  New problem(s) identified: N/A  Reason for Continuation of Hospitalization: Depression Medication stabilization Withdrawal symptoms  Interventions implemented related to continuation of hospitalization:  Librium taper  Cocktail to address pain  Encourage group attendance and participation  Additional comments:  Estimated length of stay: 1-2 days Discharge Plan: Return home, follow up CD IOP New goal(s): N/A  Review of initial/current patient goals per problem list:   1.  Goal(s):Safely detox from alcohol  Met:  No  Target date:5/5  As evidenced ZO:XWRU of 0, stable vitals   2.  Goal (s):Decrease depression  Met:  Yes  Target date:5/3  As evidenced [id number] rates her depression at a 3 today-states that is baseline  3.  Goal(s): Identify comprehensive sobriety plan  Met:  Yes  Target date:5/3  As evidenced [id number] is committed to attending the [facility] IOP program  4.  Goal(s):  Met:  No  Target date:  As evidenced by:  Attendees: Patient:     Family:     Physician:  Lynann Bologna  NP 04/23/2012 8:27 AM   Nursing:  Alease Frame  04/23/2012 8:27 AM   Case Manager:  Richelle Ito, LCSW 04/23/2012 8:27 AM   Counselor:  Ronda Fairly, LCSWA 04/23/2012 8:27 AM   Other:     Other:     Other:     Other:      Scribe for Treatment Team:   Ida Rogue,  04/23/2012 8:27 AM

## 2012-04-23 NOTE — Progress Notes (Signed)
[facility] Case Management Discharge Plan:  Will you be returning to the same living situation after discharge: Yes,  home At discharge, do you have transportation home?:Yes,  family Do you have the ability to pay for your medications:Yes,  insurance  Interagency Information:     Release of information consent forms completed and in the chart;  Patient's signature needed at discharge.  Patient to Follow up at:  Follow-up Information    Follow up with [facility] on 04/27/2012. (8:30 AM with Carmelia Bake is planning on seeing you Friday afternoon after her group to confirm your  time on Tuesday, and possibly change it to Monday.  Just go with whatever the 2 of you come up with.)    Contact information:   [address]  [facility] [phone]         Patient denies SI/HI:   Yes,  yes    Safety Planning and Suicide Prevention discussed:  Yes,  yes  Barrier to discharge identified:No.  Summary and Recommendations:   Ida Rogue 04/23/2012, 2:52 PM

## 2012-04-23 NOTE — Progress Notes (Signed)
Pt laying in bed resting with eyes closed. Respirations even and unlabored. No distress noted.  

## 2012-04-24 DIAGNOSIS — F3289 Other specified depressive episodes: Secondary | ICD-10-CM

## 2012-04-24 DIAGNOSIS — F329 Major depressive disorder, single episode, unspecified: Secondary | ICD-10-CM

## 2012-04-24 NOTE — Progress Notes (Signed)
Patient ID: Natalie Mcdaniel, female   DOB: 1966-05-09, 46 y.o.   MRN: [id number] Pt. Awake, alert, NAD.  Affect: blunted, cautious  Mood: anxious  Reviewed nursing care plan.  Pt. Denies SI/HI/AVH.  Pt. Refused librium this Am due to concerns about lingering dizziness from "medication last night." (trazodone).  Pt. Denies any withdrawal symptoms.  Pt. Initially refused protonix, this writer explained the indications for protonix but patient continued to decline the med.  Pt. C/o abd pain after lunch and accepted the protonix at that time.  This Clinical research associate recommended she take her protonix at the scheduled time tomorrow to prevent this problem and patient verbalized understanding and agreement.  Pt. Attended drug abuse group and was somewhat resistant to the group leader during the discussion but this writer has the impression that she listened closely to the information provided during the group session.

## 2012-04-24 NOTE — Progress Notes (Signed)
[facility] Family/Significant Other Suicide Prevention Education  Suicide Prevention Education:  Education Completed; Pt's husband [phone] Natalie Mcdaniel has been identified by the patient as the family member/significant other with whom the patient will be residing, and identified as the person(s) who will aid the patient in the event of a mental health crisis (suicidal ideations/suicide attempt).  With written consent from the patient, the family member/significant other has been provided the following suicide prevention education, prior to the and/or following the discharge of the patient.  The suicide prevention education provided includes the following:  Suicide risk factors  Suicide prevention and interventions  National Suicide Hotline telephone number  [facility] assessment telephone number  [facility] Emergency Assistance 911  [facility] and/or Residential Mobile Crisis Unit telephone number  Request made of family/significant other to:  Remove weapons (e.g., guns, rifles, knives), all items previously/currently identified as safety concern.    Remove drugs/medications (over-the-counter, prescriptions, illicit drugs), all items previously/currently identified as a safety concern.  The family member/significant other verbalizes understanding of the suicide prevention education information provided.  The family member/significant other agrees to remove the items of safety concern listed above.  Natalie Mcdaniel L 04/24/2012, 10:06 AM

## 2012-04-24 NOTE — Progress Notes (Signed)
Pt attends groups and has minimal participation. Pt is isolative and usually stays to herself. Pt was offered support and encouragement. Pt receptive to treatment and safety maintained on the unit.

## 2012-04-24 NOTE — Progress Notes (Signed)
Patient ID: Natalie Mcdaniel, female   DOB: 1966-10-08, 46 y.o.   MRN: [id number]  [facility] Group Notes:  (Counselor/Nursing/MHT/Case Management/Adjunct)  04/24/2012 1:15 PM  Type of Therapy:  Group Therapy, Dance/Movement Therapy   Participation Level:  Minimal  Participation Quality:  Appropriate  Affect:  Appropriate  Cognitive:  Appropriate  Insight:  Limited  Engagement in Group:  Limited  Engagement in Therapy:  Limited  Modes of Intervention:  Clarification, Problem-solving, Role-play, Socialization and Support  Summary of Progress/Problems:  Therapist invited group to draw themselves climbing a mountain to include obstacles, challenges and the supplies that will enable them to move up the mountain.  Therapist asked group to discuss the challenges in order to assess where they are presently and where they want to be.  Pt. stated " My obstacles are wine bottles.  My husband and children are helping me get to the top of the mountain. Pt. seems to be aware of obstacle in her life and the necessary tools it will take to overcome barriers.    Rhunette Croft

## 2012-04-24 NOTE — Progress Notes (Signed)
Took over Pt care at 2300, Pt in room resting in bed, respirations even and unlabored.  No distress noted.  Will continue to monitor.

## 2012-04-24 NOTE — Progress Notes (Signed)
Patient ID: Natalie Mcdaniel, female   DOB: 03-25-1966, 46 y.o.   MRN: [id number] Pt. attended and participated in aftercare planning group. Pt. accepted information on suicide prevention, warning signs to look for with suicide and crisis line numbers to use. The pt. agreed to call crisis line numbers if having warning signs or having thoughts of suicide. Pt. listed their current anxiety level as 1 and depression as 1 on scale of 1 to 10 with 10 being the high.

## 2012-04-24 NOTE — Progress Notes (Signed)
Patient ID: Natalie Mcdaniel, female   DOB: 04/16/1966, 46 y.o.   MRN: [id number] Fully alert female, pleasant, cooperative. Appropriate dress and grooming. Tolerating detox well with the withdrawal symptoms. She is requesting to go home tomorrow and has no suicidal thoughts. Taking medications as prescribed.  Plan: Discharge in the morning with detox is completed.

## 2012-04-25 DIAGNOSIS — F1010 Alcohol abuse, uncomplicated: Secondary | ICD-10-CM

## 2012-04-25 MED ORDER — TRAZODONE HCL 50 MG PO TABS: ORAL_TABLET | ORAL | Status: DC

## 2012-04-25 MED ORDER — LORATADINE 10 MG PO TABS: 10.0000 mg | ORAL_TABLET | Freq: Every day | ORAL | Status: DC

## 2012-04-25 MED ORDER — SERTRALINE HCL 50 MG PO TABS: 50.0000 mg | ORAL_TABLET | Freq: Every day | ORAL | Status: DC

## 2012-04-25 MED ORDER — ONE-DAILY MULTI VITAMINS PO TABS: 1.0000 | ORAL_TABLET | Freq: Every day | ORAL | Status: DC

## 2012-04-25 MED ORDER — THIAMINE HCL 100 MG PO TABS: 100.0000 mg | ORAL_TABLET | Freq: Every day | ORAL | Status: DC

## 2012-04-25 MED ORDER — IRON PO TABS: 1.0000 | ORAL_TABLET | Freq: Every day | ORAL | Status: DC

## 2012-04-25 NOTE — Progress Notes (Signed)
Pt was D/C home. Pt rx and follow-up visits were reviewed and pt verbalized understanding. Pt denies SI/HI. Pt belongings were returned.
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
## 2012-04-25 NOTE — Progress Notes (Signed)
[facility] Case Management Discharge Plan:  Will you be returning to the same living situation after discharge: Yes,   At discharge, do you have transportation home?:Yes, Pt. Will be transported home by her husband [name] [phone] Do you have the ability to pay for your medications:Yes,    Interagency Information:     Release of information consent forms completed and in the chart;  Patient's signature needed at discharge.  Patient to Follow up at:  Follow-up Information    Follow up with [facility] on 04/27/2012. (8:30 AM with Carmelia Bake is planning on seeing you Friday afternoon after her group to confirm your  time on Tuesday, and possibly change it to Monday.  Just go with whatever the 2 of you come up with.)    Contact information:   [address]  [facility] [phone]         Patient denies SI/HI:   Yes,      Safety Planning and Suicide Prevention discussed:  Yes,    Barrier to discharge identified: none  Summary and Recommendations: Pt. Will follow up with appoints and will be picked up at 1;30 p.m by her husband. Pt. Was given SI information and crisis hotline numbers and agreed to use them if needed.   Lamar Blinks Jeneen 04/25/2012, 10:00 AM

## 2012-04-25 NOTE — Progress Notes (Signed)
[facility] Group Notes:  (Counselor/Nursing/MHT/Case Management/Adjunct)  04/25/2012 11:09 AM  Type of Therapy:  Non Denominational   Participation Level:  Active  Participation Quality:  Appropriate, Sharing and Supportive  Affect:  Appropriate  Cognitive:  Alert and Appropriate  Insight:  Good  Engagement in Group:  Good  Engagement in Therapy:  Group is not considered therapy  Modes of Intervention:  Non Denominational   Summary of Progress/Problems:Pt attended group and actively participated. Pt shared about how she related to the article passed out called "Self-Examination at Depth". Pt stated she spiritually could not really relate to the article besides the fact that at every point in life she reaches to a higher power and has to look within herself for strength to change.      Dalia Heading 04/25/2012, 11:09 AM

## 2012-04-25 NOTE — Progress Notes (Signed)
Patient ID: Natalie Mcdaniel, female   DOB: [birth year], 46 y.o.   MRN: [id number]  [facility] Group Notes:  (Counselor/Nursing/MHT/Case Management/Adjunct)  04/25/2012 1:15 PM  Type of Therapy:  Group Therapy, Dance/Movement Therapy   Participation Level:  Did Not Attend     Rhunette Croft

## 2012-04-27 ENCOUNTER — Encounter (HOSPITAL_COMMUNITY): Payer: Self-pay | Admitting: Psychology

## 2012-04-27 NOTE — Progress Notes (Signed)
Patient ID: Natalie Mcdaniel, female   DOB: Feb 27, 1966, 46 y.o.   MRN: [id number] Orientation to CD-IOP: The patient is a 46 yo married, African-American, female seeking entry into the CD-IOP. She was discharged from detox at [facility] on Sunday and referred to this program. The patient was in this hospital approximately 1 year ago for her first alcohol detox and admitted she was able to remain sober for only about 3 months before she began drinking again. The patient lives here in [place] with her husband and their 94 yo daughter. Her 36 yo daughter, by a previous relationship, lives in [place], [place] and is scheduled to graduate from [employer] this coming Friday, the 10th. The patient reported first drinking alcohol at age 28 on her high school graduation day. She reported her alcohol use increased over the years, but it wasn't until around 2010 that she experienced a dramatic increase in her alcohol use. The patient reported she and a friend went on a cruise and were served wine at dinner. She really enjoyed the wine and when she got home she began buying it. She admitted she was drinking more and more and buying a case at a time. She reported as her drinking increased, many things in her life, like church and going to the gym 6 days a week were slowly eliminated. The patient reported she is monitored by Theora Gianotti, PA at Urgent Care on [address] and had been prescribed Zoloft, Trazadone, and Xanax. She noted she had stopped taking the Xanax and questioned whether she should continue with the other medications. I encouraged her to remain on the other 2 medications until she could meet with the Medical Director next Friday to discuss her case. The patient reported she works at home for [employer] and this makes it easy to drink during the day. The patient reported she had grown up in [place] and her father was an alcoholic and it seemed to permeate his family history. The patient was very pleasant and cooperative  and has not doubt about her alcoholism. All documentation was reviewed and the orientation completed successfully. The patient will return tomorrow and enter the CD-IOP. She will be excused from group on Friday because of her daughter's graduation, but return on Monday, the 13th.

## 2012-04-27 NOTE — Progress Notes (Signed)
Patient Discharge Instructions:  Next Level Care Provider Has Access to the EMR, 04/27/2012  [facility] has access to patients records via CHL/Epic access.  Wandra Scot, 04/27/2012, 5:13 PM

## 2012-04-28 ENCOUNTER — Other Ambulatory Visit (HOSPITAL_COMMUNITY): Payer: Managed Care, Other (non HMO) | Attending: Physician Assistant | Admitting: Psychology

## 2012-04-28 DIAGNOSIS — F1310 Sedative, hypnotic or anxiolytic abuse, uncomplicated: Secondary | ICD-10-CM | POA: Insufficient documentation

## 2012-04-28 DIAGNOSIS — F3289 Other specified depressive episodes: Secondary | ICD-10-CM | POA: Insufficient documentation

## 2012-04-28 DIAGNOSIS — F1020 Alcohol dependence, uncomplicated: Secondary | ICD-10-CM | POA: Insufficient documentation

## 2012-04-28 DIAGNOSIS — F329 Major depressive disorder, single episode, unspecified: Secondary | ICD-10-CM | POA: Insufficient documentation

## 2012-04-28 DIAGNOSIS — E669 Obesity, unspecified: Secondary | ICD-10-CM | POA: Insufficient documentation

## 2012-04-28 DIAGNOSIS — F411 Generalized anxiety disorder: Secondary | ICD-10-CM | POA: Insufficient documentation

## 2012-04-29 NOTE — Progress Notes (Signed)
    Daily Group Progress Note  Program: CD-IOP   Group Time: 1-2:30 pm  Participation Level: Minimal  Behavioral Response: Appropriate  Type of Therapy: Psycho-education Group  Topic:  Psycho-Social-Spiritual: first half of group spent in a psych-educational session on the essential elements that contribute to addiction. There was good discussion among members and they identified the various aspects of these 4 elements that led to their crossing the line into addiction. In each instance, the member's disclosure validated this understanding of addiction.   Group Time: 2:45-4 pm  Participation Level: Active  Behavioral Response: Appropriate and Sharing  Type of Therapy: Process Group  Topic: Process, Step One, and Intro: the second half of the session was spent in process. One member read his "Step One" and this invited response. There were 2 new group members and they were invited to share with the group about their addiction and what they are looking for in this program. There was good disclosure and feedback among members and the new members received a warm welcome.  Summary: The patient was new to the group and shared openly about her alcohol use. She noted that her father was an alcoholic and she had had a high tolerance once she began drinking. The patient provided the perfect example of the theory about addiction when she reported she had never drank in high school, but once she began drinking she did not return her to her old friends or even her church. The patient engaged in a active debate about her use of Xanax and she disclosed that she had been prescribed this medication for sleep and her physician had told her it was not addictive and that she prescribed it knowing she was an alcoholic. The group members who have struggled with benzos were insistent that they are addictive and encouraged her to throw away her medication. The patient did well in her first session and responded  well to this intervention.   Family Program: Family present? No   Name of family member(s):   UDS collected: No Results  AA/NA attended?: YesMonday and Tuesday  Sponsor?: No   Madisin Hasan, LCAS

## 2012-04-30 ENCOUNTER — Other Ambulatory Visit (HOSPITAL_COMMUNITY): Payer: Managed Care, Other (non HMO)

## 2012-05-03 ENCOUNTER — Other Ambulatory Visit (HOSPITAL_COMMUNITY): Payer: Managed Care, Other (non HMO) | Admitting: Psychology

## 2012-05-04 ENCOUNTER — Encounter (HOSPITAL_COMMUNITY): Payer: Self-pay | Admitting: Psychology

## 2012-05-04 NOTE — Progress Notes (Signed)
    Daily Group Progress Note  Program: CD-IOP   Group Time: 1-2:30 pm  Participation Level: Minimal  Behavioral Response: Appropriate and Sharing  Type of Therapy: Psycho-education Group  Topic: Guest Speaker: first half of group included a guest speaker. Tasia Catchings had been in the CD-IOP last year and had contacted me about coming in and speaking with the group. He shared about his long history of alcohol and drug use and the many painful consequences of his addiction. The group appeared spellbound by his story and asked some good questions at the conclusion of his talk.   Group Time: 2:45- 4pm  Participation Level: Active  Behavioral Response: Sharing, Rationalizing and Minimizing  Type of Therapy: Process Group  Topic: Group Process: second half of group spent in process. Members shared about their current struggles and issues in early recovery. One of the newer members shared that she had drank over the weekend at her daughter's college graduation. This "slip" was discussed and reviewed and length with various alternatives offered. There was good disclosure and feedback offered among group members. Summary:The patient shared in the check-in that she had had 4 margaritas on Friday evening. She reported she had gone to dinner with her 2 daughters - 1 of whom had graduated that day from [employer]. The patient reported it had been a very busy and stressful day and when they arrived at the [place], there was a frozen margarita special for $2.50. Jalaiyah reported she had been so thirsty that she ordered 1 and then had another. Group members challenged the patient about her intentions and questioned how her daughters had responded? They were aware that both females despite their mother's drinking. The patient admitted they weren't pleased, but this did not deter her. She admitted that later, at the hotel, when her 46 yo went to bed, she walked to the convenience store nearby and bought 2  Chief Strategy Officer. When questioned further, the patient admitted that if it had been wine, she wouldn't have been able to stop drinking. I pointed out that in the group session prior to her departing for the weekend festivities, she had shared in the session that she had no intention and would not drink over the weekend. Despite those declarations, she had drank. The patient insisted she wasn't that upset about it and I pointed out she was minimizing and rationalizing the alcohol use. The group agreed that this "slip" could easily promote further drinking. The patient displayed poor insight over this behavior, but she is very early in recovery and has much to learn.   Family Program: Family present? No   Name of family member(s):   UDS collected: No Results:  AA/NA attended?: No  Sponsor?: No   Jaiona Simien, LCAS
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
## 2012-05-05 ENCOUNTER — Other Ambulatory Visit (HOSPITAL_COMMUNITY): Payer: Managed Care, Other (non HMO)
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
## 2012-05-07 ENCOUNTER — Other Ambulatory Visit (HOSPITAL_COMMUNITY): Payer: Managed Care, Other (non HMO)

## 2012-05-10 ENCOUNTER — Other Ambulatory Visit (HOSPITAL_COMMUNITY): Payer: Managed Care, Other (non HMO)

## 2012-05-10 ENCOUNTER — Telehealth (HOSPITAL_COMMUNITY): Payer: Self-pay | Admitting: Psychology

## 2012-05-11 ENCOUNTER — Telehealth (HOSPITAL_COMMUNITY): Payer: Self-pay | Admitting: Psychology

## 2012-05-12 ENCOUNTER — Other Ambulatory Visit (HOSPITAL_COMMUNITY): Payer: Managed Care, Other (non HMO)

## 2012-05-13 ENCOUNTER — Ambulatory Visit: Payer: Managed Care, Other (non HMO) | Admitting: Physician Assistant

## 2012-05-14 ENCOUNTER — Other Ambulatory Visit (HOSPITAL_COMMUNITY): Payer: Managed Care, Other (non HMO)

## 2012-05-18 ENCOUNTER — Ambulatory Visit (HOSPITAL_COMMUNITY): Payer: Self-pay

## 2012-05-18 NOTE — Progress Notes (Unsigned)
    Daily Group Progress Note  Program: CD-IOP   Group Time: 1300  May 14, 2012  Participation Level: Active  Behavioral Response: Sharing, Rigid and Blaming  Type of Therapy: Process Group  Topic: Building Social Supports     Group Time: 1500 May 14, 2012  Participation Level: Active  Behavioral Response: Rigid  Type of Therapy: Process Group    Summary: Patient presented in group with closed rigid body language. Arms crossed throughout group, flat expression. Patient challenged fellow group member on moral choices. Minimal sharing into her own personal issues. States "I am fine". Did share that her sobriety date was 05/12/12. She stated that she was able to abstain for two days because her daughter was visiting and that if she did not feel the pressure to please her daughter she "probably would have drank." The group encouraged her to think about long term benefit to her relationship with daughter if she continued her sobriety. Patient supported and praised by group for returning to CD-IOP. Group processed alternatives for Memorial Day celebrations that did not trigger relapse.   Family Program: Family present? No   Name of family member(s): ***  UDS collected: No    AA/NA attended?: Yes, stated that she had tried several group and found a group that was predominately middle aged African American.  Sponsor?: No   Joice Lofts RN MS EdS 05/18/2012  8:38 AM

## 2012-05-19 ENCOUNTER — Other Ambulatory Visit (HOSPITAL_COMMUNITY): Payer: Managed Care, Other (non HMO)

## 2012-05-21 ENCOUNTER — Other Ambulatory Visit (HOSPITAL_COMMUNITY): Payer: Managed Care, Other (non HMO)

## 2012-05-24 ENCOUNTER — Other Ambulatory Visit (HOSPITAL_COMMUNITY): Payer: Managed Care, Other (non HMO) | Attending: Physician Assistant | Admitting: Psychology

## 2012-05-24 DIAGNOSIS — Z9884 Bariatric surgery status: Secondary | ICD-10-CM | POA: Insufficient documentation

## 2012-05-24 DIAGNOSIS — F411 Generalized anxiety disorder: Secondary | ICD-10-CM | POA: Insufficient documentation

## 2012-05-24 DIAGNOSIS — IMO0002 Reserved for concepts with insufficient information to code with codable children: Secondary | ICD-10-CM | POA: Insufficient documentation

## 2012-05-24 DIAGNOSIS — Z9149 Other personal history of psychological trauma, not elsewhere classified: Secondary | ICD-10-CM | POA: Insufficient documentation

## 2012-05-24 DIAGNOSIS — F1020 Alcohol dependence, uncomplicated: Secondary | ICD-10-CM | POA: Insufficient documentation

## 2012-05-24 DIAGNOSIS — E669 Obesity, unspecified: Secondary | ICD-10-CM | POA: Insufficient documentation

## 2012-05-24 DIAGNOSIS — F10988 Alcohol use, unspecified with other alcohol-induced disorder: Secondary | ICD-10-CM | POA: Insufficient documentation

## 2012-05-25 NOTE — Progress Notes (Signed)
    Daily Group Progress Note  Program: CD-IOP   Group Time: 1-2:30 pm  Participation Level: Minimal  Behavioral Response: patient appeared very subdued and unwilling to share  Type of Therapy: Psycho-education Group  Topic: "Five Common Challenges in Early Recovery": a presentation was provided on the most common challenges that are experienced in early recovery. The presentation was accompanied by a handout from the [facility]. Members were invited to share about their own experiences with these 5 challenges, including friends, emotions, substances in the home, boredom and dealing with special occasions. There was good disclosure among the group and the importance of recognizing and addressing these 5 issues was clearly recognized among group members.  Group Time: 2:45- 4pm  Participation Level: Minimal  Behavioral Response: Resistant  Type of Therapy: Process Group  Topic: Process, Introduction, and Graduation: second half of group was spent in process. Members discussed their current struggles and issues in early recovery. A new group member was present and he introduced himself and described his struggles with alcohol and Ambien. As the session concluded, a graduation ceremony was held honoring a successfully graduating member. There were kind words shared and hopes for the future. This session proved very effective in demonstrating the caring and support gained from others in recovery.  Summary: The patient returned to group today after a 2 session absence. She reported her sobriety date was still 5/30 and that she had not drank since then. When asked to check-in, she shared little about herself or her situation and admitted she "didn't want to talk about it". She disclosed little in group aside from pointing out that she was like the new group member in that she didn't drink around others, but almost always by herself. Dorothee reported this would be her last week because she had  to return to work next Monday. The patient has attended only a very few sessions and seemed unwilling to consider other ways of being. She has made very little progress to date and it is unclear what she hopes to accomplish with just 2 more group sessions.    Family Program: Family present? No   Name of family member(s):   UDS collected: No Results:   AA/NA attended?: No  Sponsor?: No   Jermia Rigsby, LCAS

## 2012-05-26 ENCOUNTER — Other Ambulatory Visit (HOSPITAL_COMMUNITY): Payer: Managed Care, Other (non HMO) | Admitting: Psychology

## 2012-05-26 DIAGNOSIS — F1920 Other psychoactive substance dependence, uncomplicated: Secondary | ICD-10-CM

## 2012-05-27 LAB — PRESCRIPTION ABUSE MONITORING 17P, URINE
Cannabinoid Scrn, Ur: NEGATIVE ng/mL
Cocaine Metabolites: NEGATIVE ng/mL
Creatinine, Urine: 115.24 mg/dL (ref >20.0)
MDMA URINE: NEGATIVE ng/mL
Methadone Screen, Urine: NEGATIVE ng/mL
Opiate Screen, Urine: NEGATIVE ng/mL
Tapentadol, urine: NEGATIVE ng/mL
Tramadol Scrn, Ur: NEGATIVE ng/mL

## 2012-05-28 ENCOUNTER — Other Ambulatory Visit (HOSPITAL_COMMUNITY): Payer: Managed Care, Other (non HMO) | Admitting: Psychology

## 2012-05-28 NOTE — Progress Notes (Signed)
    Daily Group Progress Note  Program: CD-IOP   Group Time: 1-2:30 pm  Participation Level: Minimal  Behavioral Response: spoke little  Type of Therapy: Psycho-education Group  Topic: "Roadmap to Recovery": First part of group was spent in psycho education session on the aspects of early recovery. A handout from the [employer] Program was provided identifying 5 stages in the recovery process that cover the first 6-8 months of sobriety. The session was meant to educate group members about what they might expect as they progress in their sobriety and the different symptoms and side effects that they might be prepared for. Members that had achieved 60+ days of sobriety shared their experiences of their changing physical and emotional conditions while others described how they remain in a "fog". the session proved very informative and generated good discussion among the group.  Group Time: 2:45- 4pm  Participation Level: Minimal  Behavioral Response: Sharing  Type of Therapy: Process Group  Topic: Process and Introduction: the second half of group was spent in process. Members shared about their current issues and concerns in early recovery. One member talked about his struggles with his wife and her lack of trust and continual criticism of his addiction and the price to his family. Members shared and provided good support and feedback to this member who was clearly hurting. Another member was new to the group and introduced himself and the purpose behind his being here in the program. There was good disclosure, but members had distinct differences of opinion about how to handle their struggles.  Summary: The patient reported she has remained sober since the 31rst. She reported she is going to leave the group and go back to work next week. I wondered what she intended to do differently in order to remain alcohol-free? She reported she had received a call from a woman she had met previously in an  AA meeting and she had agreed to be Natalie Mcdaniel's sponsor. Brendi insisted that she would continue to attend AA meetings, but had little to offer for how she intends to address the gap between her work day's end at 3 pm and any meetings or support at 5 pm. The patient had first entered the program about 3 weeks ago, but had stopped attending group and continued to drink. Her employer has asked her to return to work and she believes she can return to her "old life" but not drink.  Family Program: Family present? No   Name of family member(s):   UDS collected: No Results:   AA/NA attended?: No  Sponsor?: No   Honour Schwieger, LCAS

## 2012-05-30 NOTE — Progress Notes (Signed)
    Daily Group Progress Note  Program: CD-IOP   Group Time: 1-2:30 pm  Participation Level: Active  Behavioral Response: Appropriate  Type of Therapy: Psycho-education Group  Topic: The Importance of Honesty in Recovery: A presentation was provided on honesty in recovery. The lack of honesty is part of active addiction and it is essential that one open up and become completely honest in recovery. Members were provided a [employer] handout that asked questions and the session included a discussion of those answers. Almost every group member shared that they felt shame for what they had done while using and the difficulty in acknowledging those behaviors. -There was good discussion and some disclosures that recognized and sought to be honest in early recovery.  Group Time: 2:45-4 pm  Participation Level: Active  Behavioral Response: Sharing  Type of Therapy: Process Group  Topic: Group Process and Graduation: the second half of group was psent in process. Members continued to talk about honesty and shared what he was really feeling right now. The group applauded his honesty. At the conclusion of the session a graduation ceremony was held honoring a successfully graduating group member. There were kind words and tears during this ceremony. The validation and applause for sobriety was therapeutic for this group.  Summary: The patient reported she is doing better. She shared in check-in that she attended an Al-Anon meeting with her husband last night. While they were in the meeting, her 28 yo daughter was in the Al-Ateen meeting downstairs. There were 20 men in the group of 30 and the patient reported her husband found the session very compelling and assured the other people that he would be back next Thursday. The patient urged other group members to encourage their families to go to Al-Anon. She admitted that she was frequently dishonest because she didn't want to hurt other people's feelings.  The patient had kind words to share to the graduating member and wiped away tears in response to the words shared by his other group members. She said good-bye to the group as she has agreed to return to work next Monday and will not be in session going forward.  Family Program: Family present? No   Name of family member(s):   UDS collected: No Results  AA/NA attended?: YesTuesday, Wednesday and Thursday  Sponsor?: No   Darrion Wyszynski, LCAS

## 2012-05-31 ENCOUNTER — Other Ambulatory Visit (HOSPITAL_COMMUNITY): Payer: Managed Care, Other (non HMO) | Admitting: Psychology

## 2012-06-02 ENCOUNTER — Other Ambulatory Visit (HOSPITAL_COMMUNITY): Payer: Managed Care, Other (non HMO) | Admitting: Psychology

## 2012-06-04 ENCOUNTER — Inpatient Hospital Stay (HOSPITAL_COMMUNITY)
Admission: AD | Admit: 2012-06-04 | Discharge: 2012-06-07 | DRG: 897 | Disposition: A | Discharge status: Home / Self Care | Payer: Managed Care, Other (non HMO) | Source: Ambulatory Visit | Attending: Psychiatry | Admitting: Psychiatry

## 2012-06-04 ENCOUNTER — Other Ambulatory Visit (HOSPITAL_COMMUNITY): Payer: Managed Care, Other (non HMO)

## 2012-06-04 DIAGNOSIS — IMO0001 Reserved for inherently not codable concepts without codable children: Secondary | ICD-10-CM | POA: Diagnosis present

## 2012-06-04 DIAGNOSIS — E669 Obesity, unspecified: Secondary | ICD-10-CM | POA: Diagnosis present

## 2012-06-04 DIAGNOSIS — F329 Major depressive disorder, single episode, unspecified: Secondary | ICD-10-CM

## 2012-06-04 DIAGNOSIS — Z6830 Body mass index (BMI) 30.0-30.9, adult: Secondary | ICD-10-CM

## 2012-06-04 DIAGNOSIS — IMO0002 Reserved for concepts with insufficient information to code with codable children: Secondary | ICD-10-CM

## 2012-06-04 DIAGNOSIS — Z79899 Other long term (current) drug therapy: Secondary | ICD-10-CM

## 2012-06-04 DIAGNOSIS — F1020 Alcohol dependence, uncomplicated: Principal | ICD-10-CM | POA: Diagnosis present

## 2012-06-04 DIAGNOSIS — Z9884 Bariatric surgery status: Secondary | ICD-10-CM

## 2012-06-04 LAB — COMPREHENSIVE METABOLIC PANEL
ALT: 62 U/L — ABNORMAL HIGH (ref 0–35)
AST: 79 U/L — ABNORMAL HIGH (ref 0–37)
CO2: 25 mEq/L (ref 19–32)
Chloride: 96 mEq/L (ref 96–112)
GFR calc non Af Amer: 84 mL/min — ABNORMAL LOW (ref >90)
Sodium: 135 mEq/L (ref 135–145)
Total Bilirubin: 0.6 mg/dL (ref 0.3–1.2)

## 2012-06-04 LAB — CBC
Platelets: 456 10*3/uL — ABNORMAL HIGH (ref 150–400)
RBC: 4.04 MIL/uL (ref 3.87–5.11)
WBC: 6.6 10*3/uL (ref 4.0–10.5)

## 2012-06-04 LAB — HEPATIC FUNCTION PANEL
ALT: 63 U/L — ABNORMAL HIGH (ref 0–35)
Albumin: 3.5 g/dL (ref 3.5–5.2)
Alkaline Phosphatase: 93 U/L (ref 39–117)
Indirect Bilirubin: 0.4 mg/dL (ref 0.3–0.9)
Total Bilirubin: 0.6 mg/dL (ref 0.3–1.2)

## 2012-06-04 MED ORDER — ACETAMINOPHEN 325 MG PO TABS: 650.0000 mg | ORAL_TABLET | Freq: Four times a day (QID) | ORAL | Status: DC | PRN

## 2012-06-04 MED ORDER — ACETAMINOPHEN 325 MG PO TABS
650.0000 mg | ORAL_TABLET | Freq: Four times a day (QID) | ORAL | Status: DC | PRN
  Administered 2012-06-05: 650 mg via ORAL

## 2012-06-04 MED ORDER — CHLORDIAZEPOXIDE HCL 25 MG PO CAPS: 25.0000 mg | ORAL_CAPSULE | Freq: Four times a day (QID) | ORAL | Status: DC | PRN

## 2012-06-04 MED ORDER — CHLORDIAZEPOXIDE HCL 25 MG PO CAPS
25.0000 mg | ORAL_CAPSULE | Freq: Three times a day (TID) | ORAL | Status: AC
  Administered 2012-06-06: 25 mg via ORAL
  Filled 2012-06-04 (×4): qty 1

## 2012-06-04 MED ORDER — TRAZODONE HCL 50 MG PO TABS
50.0000 mg | ORAL_TABLET | Freq: Every evening | ORAL | Status: DC | PRN
  Administered 2012-06-04 – 2012-06-05 (×2): 50 mg via ORAL
  Filled 2012-06-04 (×2): qty 1

## 2012-06-04 MED ORDER — MAGNESIUM HYDROXIDE 400 MG/5ML PO SUSP: 30.0000 mL | Freq: Every day | ORAL | Status: DC | PRN

## 2012-06-04 MED ORDER — CHLORDIAZEPOXIDE HCL 25 MG PO CAPS
25.0000 mg | ORAL_CAPSULE | Freq: Four times a day (QID) | ORAL | Status: AC
  Administered 2012-06-04 – 2012-06-05 (×5): 25 mg via ORAL
  Filled 2012-06-04 (×4): qty 1

## 2012-06-04 MED ORDER — LORATADINE 10 MG PO TABS
10.0000 mg | ORAL_TABLET | Freq: Every day | ORAL | Status: DC
  Administered 2012-06-05 – 2012-06-06 (×2): 10 mg via ORAL
  Filled 2012-06-04 (×6): qty 1

## 2012-06-04 MED ORDER — ALUM & MAG HYDROXIDE-SIMETH 200-200-20 MG/5ML PO SUSP: 30.0000 mL | ORAL | Status: DC | PRN

## 2012-06-04 MED ORDER — CHLORDIAZEPOXIDE HCL 25 MG PO CAPS: 25.0000 mg | ORAL_CAPSULE | ORAL | Status: DC

## 2012-06-04 MED ORDER — CHLORDIAZEPOXIDE HCL 25 MG PO CAPS
50.0000 mg | ORAL_CAPSULE | Freq: Once | ORAL | Status: AC
  Administered 2012-06-04: 50 mg via ORAL
  Filled 2012-06-04: qty 2

## 2012-06-04 MED ORDER — CHLORDIAZEPOXIDE HCL 25 MG PO CAPS: 25.0000 mg | ORAL_CAPSULE | Freq: Every day | ORAL | Status: DC

## 2012-06-04 MED ORDER — HYDROXYZINE HCL 25 MG PO TABS: 25.0000 mg | ORAL_TABLET | Freq: Four times a day (QID) | ORAL | Status: DC | PRN

## 2012-06-04 MED ORDER — THIAMINE HCL 100 MG/ML IJ SOLN: 100.0000 mg | Freq: Once | INTRAMUSCULAR | Status: DC

## 2012-06-04 MED ORDER — LOPERAMIDE HCL 2 MG PO CAPS: 2.0000 mg | ORAL_CAPSULE | ORAL | Status: DC | PRN

## 2012-06-04 MED ORDER — SERTRALINE HCL 50 MG PO TABS
50.0000 mg | ORAL_TABLET | Freq: Every day | ORAL | Status: DC
  Administered 2012-06-05 – 2012-06-07 (×3): 50 mg via ORAL
  Filled 2012-06-04 (×6): qty 1

## 2012-06-04 MED ORDER — ONDANSETRON 4 MG PO TBDP: 4.0000 mg | ORAL_TABLET | Freq: Four times a day (QID) | ORAL | Status: DC | PRN

## 2012-06-04 MED ORDER — THIAMINE HCL 100 MG PO TABS: 100.0000 mg | ORAL_TABLET | Freq: Every day | ORAL | Status: DC

## 2012-06-04 MED ORDER — VITAMIN B-1 100 MG PO TABS
100.0000 mg | ORAL_TABLET | Freq: Every day | ORAL | Status: DC
  Administered 2012-06-05 – 2012-06-07 (×3): 100 mg via ORAL
  Filled 2012-06-04 (×4): qty 1

## 2012-06-04 MED ORDER — ADULT MULTIVITAMIN W/MINERALS CH
1.0000 | ORAL_TABLET | Freq: Every day | ORAL | Status: DC
  Administered 2012-06-04 – 2012-06-07 (×4): 1 via ORAL
  Filled 2012-06-04 (×5): qty 1

## 2012-06-04 NOTE — Progress Notes (Signed)
Patient ID: Natalie Mcdaniel, female   DOB: [birth year], 46 y.o.   MRN: [id number] Voluntary admission, pt presented as a walk-in, referred from CD-IOP, pt stated that she was in a group meeting this morning, was to see Jorje Guild her PA and stated that he suggest that she come in for detox. Pt did state that she has had 2 glasses of wine today and spoke of relapsing last Friday and has been drinking daily since then, pt states that she has been getting sick if she does not drink and is slightly anxious and tremulous on admission, pt states that she lives with husband and will go back there upon discharge, pt does endorse depression but denies any suicidal thoughts and able to contract for safety on the unit, pt oriented to unit and safety maintained.

## 2012-06-04 NOTE — Progress Notes (Signed)
D.  Pt is a 46 year old AAF admitted for detox from ETOH.  She has been drinking up to a liter of wine daily since her relapse on Friday.  She was previously here last month.  Pt in room resting in bed, requested medication for sleep.  Denies complaints, denies SI/HI/hallucinations at this time.  A.  Support and encouragement offered R.  Pt remains safe, will continue to monitor.
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
## 2012-06-05 DIAGNOSIS — F1010 Alcohol abuse, uncomplicated: Secondary | ICD-10-CM

## 2012-06-05 LAB — RAPID URINE DRUG SCREEN, HOSP PERFORMED
Amphetamines: NOT DETECTED
Barbiturates: NOT DETECTED
Cocaine: NOT DETECTED
Tetrahydrocannabinol: NOT DETECTED

## 2012-06-05 LAB — URINALYSIS, ROUTINE W REFLEX MICROSCOPIC
Bilirubin Urine: NEGATIVE
Hgb urine dipstick: NEGATIVE
Ketones, ur: NEGATIVE mg/dL
Nitrite: NEGATIVE
pH: 7.5 (ref 5.0–8.0)

## 2012-06-05 LAB — TSH: TSH: 1.006 u[IU]/mL (ref 0.350–4.500)

## 2012-06-05 MED ORDER — IBUPROFEN 200 MG PO TABS
400.0000 mg | ORAL_TABLET | Freq: Three times a day (TID) | ORAL | Status: DC | PRN
  Filled 2012-06-05: qty 2

## 2012-06-05 NOTE — Progress Notes (Signed)
Patient ID: Natalie Mcdaniel, female   DOB: [birth year], 46 y.o.   MRN: [id number] Pt. attended and participated in aftercare planning group. Pt. verbally accepted information on suicide prevention, warning signs to look for with suicide and crisis line numbers to use. The pt. agreed to call crisis line numbers if having warning signs or having thoughts of suicide. Pt. listed their current anxiety level as a 5 and depression level as a 1 on a scale of 1 to 10 with 10 being the high.

## 2012-06-05 NOTE — Progress Notes (Signed)
Adult Psychosocial Assessment Update Interdisciplinary Team  Previous Behavior Health Hospital admissions/discharges:  Admissions Discharges  Date: 04/21/2012 Date: 04/25/2012  Date: Date:  Date: Date:  Date: Date:  Date: Date:   Changes since the last Psychosocial Assessment (including adherence to outpatient mental health and/or substance abuse treatment, situational issues contributing to decompensation and/or relapse). Since patient's last [facility] discharge, pt. has been attending [place] [facility] with Charmian Muff and Dora Sims, attending AA and NA meetings, and attending [place] with family members.   Pt. started back at work last Monday and became stressed and overwhelmed which caused the relapse. Pt. stated that she received 1,200 emails and her manager assigned her several new tasks and she just couldn't handle the pressure.   Pt. began drinking after she started back to work and could not stop. Pt. stated she attempted to attend meetings but felt embarrassed because she relapsed. Pt. stated that Mat Carne suggested admittance to the hospital and pt. did not want to be home over the weekend because she would continue drinking.          Discharge Plan 1. Will you be returning to the same living situation after discharge?   Yes: X No:      If no, what is your plan?           2. Would you like a referral for services when you are discharged? Yes:     If yes, for what services?  No: X      Pt. will return to [place] [facility] after discharge.        Summary and Recommendations (to be completed by the evaluator) Recommendations for treatment include crisis stabilization, case management, medication management, psychoeducation to teach coping skills, and group therapy.                        Signature:  [name], 06/05/2012 9:57 AM
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
## 2012-06-05 NOTE — Progress Notes (Signed)
[facility] Adult Inpatient Family/Significant Other Suicide Prevention Education  Suicide Prevention Education:  Education Completed; Catlynn Grondahl ([phone],  (name of family member/significant other) has been identified by the patient as the family member/significant other with whom the patient will be residing, and identified as the person(s) who will aid the patient in the event of a mental health crisis (suicidal ideations/suicide attempt).  With written consent from the patient, the family member/significant other has been provided the following suicide prevention education, prior to the and/or following the discharge of the patient.  The suicide prevention education provided includes the following:  Suicide risk factors  Suicide prevention and interventions  National Suicide Hotline telephone number  [facility] assessment telephone number  [facility] Emergency Assistance 911  [facility] and/or Residential Mobile Crisis Unit telephone number  Request made of family/significant other to:  Remove weapons (e.g., guns, rifles, knives), all items previously/currently identified as safety concern.    Remove drugs/medications (over-the-counter, prescriptions, illicit drugs), all items previously/currently identified as a safety concern.  The family member/significant other verbalizes understanding of the suicide prevention education information provided.  The family member/significant other agrees to remove the items of safety concern listed above.  Pt. accepted information on suicide prevention, warning signs to look for with suicide and crisis line numbers to use. The pt. agreed to call crisis line numbers if having warning signs or having thoughts of suicide.  Family stated they have no concerns and can provide transportation home. Pt can return home at time of D/C. Husband had liminted understanding of English and gave telephone to daughter how alos received  crisis line information. Due to the launuage barrier it is unclear how accurately the information was relayed. Both husband and daughter did repeat "call 911 if bad".     [facility] 06/05/2012, 3:48 PM

## 2012-06-05 NOTE — Progress Notes (Signed)
Patient ID: RAMLA HASE, female   DOB: [birth year], 46 y.o.   MRN: [id number]   [facility] Group Notes:  (Counselor/Nursing/MHT/Case Management/Adjunct)  06/05/2012 1:15 PM  Type of Therapy:  Group Therapy, Dance/Movement Therapy   Participation Level:  Minimal  Participation Quality:  Appropriate   Affect:  Appropriate   Cognitive:  Appropriate  Insight:  None  Engagement in Group:  Limited  Engagement in Therapy:  Limited  Modes of Intervention:  Clarification, Problem-solving, Role-play, Socialization and Support  Summary of Progress/Problems: Therapist began group by asking group members to share one positive thing they like to do that makes them happy. Therapist then followed up this question by asking group members how these things can be bad for [place]. Group members then discussed AA. Some group members were familiar with AA but others were not. Pt. joined group at the very end. Pt. was appropriate but did not participate during group.     [name]

## 2012-06-05 NOTE — H&P (Signed)
  Pt was seen by me today and I agree with the key elements documented in H&P.  

## 2012-06-05 NOTE — [facility] Suicide Risk Assessment (Signed)
Suicide Risk Assessment  Admission Assessment     Demographic factors:  Assessment Details Time of Assessment: Admission Information Obtained From: Patient Current Mental Status:   see below Loss Factors:  Loss Factors: Financial problems / change in socioeconomic status Historical Factors:  Historical Factors: Family history of mental illness or substance abuse Risk Reduction Factors:  Risk Reduction Factors: Responsible for children under 48 years of age;Sense of responsibility to family;Positive social support;Living with another person, especially a relative  CLINICAL FACTORS:   Alcohol/Substance Abuse/Dependencies  COGNITIVE FEATURES THAT CONTRIBUTE TO RISK:  Closed-mindedness    SUICIDE RISK:   Mild:  Suicidal ideation of limited frequency, intensity, duration, and specificity.  There are no identifiable plans, no associated intent, mild dysphoria and related symptoms, good self-control (both objective and subjective assessment), few other risk factors, and identifiable protective factors, including available and accessible social support.  PLAN OF CARE:   Mental Status Examination/Evaluation:  Objective: Appearance: Fairly Groomed   Psychomotor Activity: Normal   Eye Contact:: Good   Speech: Normal Rate   Volume: Normal   Mood:upset that she has relapsed   Affect: Congruent   Thought Process: clear rational goal oriented   Orientation: Full   Thought Content: No AVH/psychosis   Suicidal Thoughts: No   Homicidal Thoughts: No   Judgement: Fair   Insight: Fair   DIAGNOSIS:  AXIS I  Alcohol Abuse   AXIS II  Deferred   AXIS III  See medical history.   AXIS IV  Recent relapse despite being in CD-IOP   AXIS V  35   Treatment Plan Summary:  Admit for chemically supported detox from alcohol as indicated.   Wonda Cerise 06/05/2012, 7:10 PM

## 2012-06-05 NOTE — Progress Notes (Signed)
Patient visible on the unit and interacting with peers. Denies SI. Rates depression at 9 and feeling hopeless at 2. Appears depressed. Patient reports withdrawal symptoms including headache, crawling skin and tremors. Patient is still getting scheduled doses of librium. Received prn tylenol this afternoon. She plans on going to "AA, CD-IOP" after discharge. Remains safe on the unit.

## 2012-06-06 MED ORDER — DISULFIRAM 250 MG PO TABS
500.0000 mg | ORAL_TABLET | Freq: Every day | ORAL | Status: DC
  Administered 2012-06-06 – 2012-06-07 (×2): 500 mg via ORAL
  Filled 2012-06-06 (×4): qty 2

## 2012-06-06 MED ORDER — TRAZODONE HCL 50 MG PO TABS
25.0000 mg | ORAL_TABLET | Freq: Every evening | ORAL | Status: DC | PRN
  Administered 2012-06-06: 25 mg via ORAL
  Filled 2012-06-06: qty 1
  Filled 2012-06-06: qty 7

## 2012-06-06 NOTE — Progress Notes (Signed)
Patient ID: Natalie Mcdaniel, female   DOB: 06-24-1966, 46 y.o.   MRN: [id number]  Pt. attended and participated in aftercare planning group. Pt. reported that her anxiety and depression level were both 1. Pt. verbally accepted suicide prevention information. Pt. is comfortable with discharge plan of returing home and continuing with IOP. Pt. was active and sharing during the discussion regarding the 12 promises.

## 2012-06-06 NOTE — Progress Notes (Signed)
Patient ID: Natalie Mcdaniel, female   DOB: March 07, 1966, 46 y.o.   MRN: [id number]   [facility] Notes:  (Counselor/Nursing/MHT/Case Management/Adjunct)  06/06/2012 1:15 PM  Type of Therapy:  Group Therapy, Dance/Movement Therapy   Participation Level:  Active  Participation Quality:  Appropriate, Attentive and Sharing  Affect:  Appropriate  Cognitive:  Appropriate  Insight:  Good  Engagement in Group:  Good  Engagement in Therapy:  Good  Modes of Intervention:  Clarification, Problem-solving, Role-play, Socialization and Support  Summary of Progress/Problems: Pt. participated in a group discussion in viewing of the movie My Name is Francisco Capuchin. Pt. spoke about themes of enabling, family dynamics of the addicted family, support systems, 12 step groups and the power of addiction. Pt. was encouraged to develop a plan on how to use 12 step programs and supports upon discharge. Pt. was very active during discussion and seemed to relate to various scenes in the film. Pt. shared her experience with AA and [place] for other group members who were unfamiliar.      [name]

## 2012-06-06 NOTE — Progress Notes (Signed)
Lying quietly in bed with eyes closed.  Safety checks are being conducted Q 15 minutes.  Safety maintained.

## 2012-06-06 NOTE — Progress Notes (Signed)
Patient ID: Natalie Mcdaniel, female   DOB: 16-Feb-1966, 46 y.o.   MRN: [id number] 06-06-12 @ 1011 nursing shift note: pt came to the med window this am and eat her breakfast. She is engaged in her care. On her inventory sheet she wrote requested sleep medication, appetite good, energy low, attention good with her depression and hopelessness both at 10. She is experiencing sedation. It was suggest by rn that she take 1/2 of her trazodone since she stated it was making her "too sleepy". Denies any thought about suicide and no thoughts of hurting herself. When she goes home she plans to go to aa and cd-iop. She states she would like to stop all meds except a 1/2 of the trazodone. She see no problems staying on her meds after discharge. Pt has refused two of the three doses of librium. rn will monitor and q 15 min cks continue.

## 2012-06-06 NOTE — Progress Notes (Signed)
Patient ID: Natalie Mcdaniel, female   DOB: 1966-03-31, 46 y.o.   MRN: [id number]  Good mood. Detox going well. Complain that her trazodone is too strong. Sleep is goo.   Mental Status Examination/Evaluation:  Objective: Appearance: Fairly Groomed   Psychomotor Activity: Normal   Eye Contact:: Good   Speech: Normal Rate   Volume: Normal   Mood:upset that she has relapsed   Affect: Congruent   Thought Process: clear rational goal oriented   Orientation: Full   Thought Content: No AVH/psychosis   Suicidal Thoughts: No   Homicidal Thoughts: No   Judgement: Fair   Insight: Fair   DIAGNOSIS:  AXIS I  Alcohol Abuse   AXIS II  Deferred   AXIS III  See medical history.   AXIS IV  Recent relapse despite being in CD-IOP   AXIS V  35   Treatment Plan Summary:   Will decrease Trazodone to 25 mg qhs on her request

## 2012-06-07 ENCOUNTER — Encounter (HOSPITAL_COMMUNITY): Payer: Self-pay

## 2012-06-07 ENCOUNTER — Other Ambulatory Visit (HOSPITAL_COMMUNITY): Payer: Managed Care, Other (non HMO)

## 2012-06-07 DIAGNOSIS — F1020 Alcohol dependence, uncomplicated: Principal | ICD-10-CM

## 2012-06-07 MED ORDER — DISULFIRAM 250 MG PO TABS: 500.0000 mg | ORAL_TABLET | Freq: Every day | ORAL | Status: DC

## 2012-06-07 MED ORDER — ONE-DAILY MULTI VITAMINS PO TABS: 1.0000 | ORAL_TABLET | Freq: Every day | ORAL | Status: DC

## 2012-06-07 MED ORDER — IRON PO TABS: 1.0000 | ORAL_TABLET | Freq: Every day | ORAL | Status: DC

## 2012-06-07 MED ORDER — THIAMINE HCL 100 MG PO TABS: 100.0000 mg | ORAL_TABLET | Freq: Every day | ORAL | Status: DC

## 2012-06-07 MED ORDER — SERTRALINE HCL 50 MG PO TABS: 50.0000 mg | ORAL_TABLET | Freq: Every day | ORAL | Status: DC

## 2012-06-07 MED ORDER — LORATADINE 10 MG PO TABS: 10.0000 mg | ORAL_TABLET | Freq: Every day | ORAL | Status: DC

## 2012-06-07 MED ORDER — TRAZODONE HCL 50 MG PO TABS: ORAL_TABLET | ORAL | Status: DC

## 2012-06-07 NOTE — Progress Notes (Unsigned)
    Daily Group Progress Note  Program: CD-IOP   Group Time: 04 June 2012 1300  Participation Level: Active  Behavioral Response: Patient has had two glasses of wine prior to start of group. Her 49 year old daughter drove her to CD-IOP. Patient visibly upset, hand wriinging and visible tremors. Jorje Guild Pa took patient out of group to evaluate. Patient to go up to assessment for in-patient detox hospitalization after group.  Type of Therapy: Psycho-education Group  Topic: Wheel of Life Exercise - patient explored eight areas of life and identified the degree to which he felt balanced. Group assisted patient in exploring and brainstorming ways to increase fun and recreation as well as personal growth. Patient stated that her life was out of control and not balanced. She did say that she felt that her family was a great source of support and felt that that was a balanced area. Group challenged her.     Group Time: 04 June 2012 1500  Participation Level: Active  Behavioral Response: Appropriate  Type of Therapy: Process Group  Topic: Relapse Prevention/12 Step Recovery   Summary: Patient did say that she understands that she has a problem and is open to detox and would like to return to CD-IOP when discharged from in-patient. Group spent time talking to patient about effects her drinking has on her family. Patient receptive to feedback. Rates stress level as 10 on daily self inventory.   Family Program: Family present? No    UDS collected: No Results:   AA/NA attended?: YesWednesday  Sponsor?: No   Joice Lofts RN MS EdS 06/07/2012  5:07 PM

## 2012-06-07 NOTE — Progress Notes (Unsigned)
    Daily Group Progress Note  Program: CD-IOP   Group Time: 07 June 2012 1300  Participation Level: Active  Behavioral Response: Appropriate  Type of Therapy: Psycho-education Group  Topic: Medication group lead by Verna Czech - pharmacist. Patient talked about her hope that the Antabuse will help her to take one day at a time with not drinking. Her husband will get her to take the Antabuse each morning.     Group Time: 07 June 2012 1500  Participation Level: Active  Behavioral Response: Appropriate  Type of Therapy: Process Group  Topic: Relapse revovery/12 step group   Summary: Patient appeared brighter today. States that she got more out of this admission than the previous two admissions this year. Patient states that her husband and daughter are supportive. She has better insight into her alcohol addiction and stated that she would be going to an AA meeting this evening.   Family Program: Family present? No     UDS collected: No   AA/NA attended?: No   Sponsor?: No   Joice Lofts RN MS EdS 06/07/2012  5:12 PM

## 2012-06-07 NOTE — Progress Notes (Signed)
Lying quietly in bed with eyes closed.  Exhibiting normal sleep behavior when observed during Q 15 minute safety checks. 
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
## 2012-06-07 NOTE — Progress Notes (Addendum)
Patient ID: Natalie Mcdaniel, female   DOB: Apr 13, 1966, 46 y.o.   MRN: [id number] 06-07-12 nursing shift note; D:  rn was present at treatment team. Pt's plan of care was discussed. A: she will be discharged today. Her disability paperwork for her job was given to c/m. R: she was comfortable with d/c today.  Pt needs to be discharged by 1300 for cd- iop.pt will be discharged by caroline rn.Marland KitchenMarland Kitchen

## 2012-06-07 NOTE — Progress Notes (Signed)
[facility] Group Notes:  (Counselor/Nursing/MHT/Case Management/Adjunct)  06/07/2012 3:40 PM  Type of Therapy:  Group Therapy  Participation Level:  Active  Participation Quality:  Attentive and Sharing  Affect:  Appropriate  Cognitive:  Appropriate  Insight:  Limited  Engagement in Group:  Good  Engagement in Therapy:  Good  Modes of Intervention:  Problem-solving, Support and Exploration  Summary of Progress/Problems:  Kayin was present for group discussion focused on what patient's see as their own obstacles to recovery. She shared her belief that work will be difficult to deal with due to  Recent relapse initiated by work triggers.  Suggestions from group included changing her home office set up, possibly moving her desk to different vies, changing the chair, etc anything she could do to make it different.  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  Later she shared  that she had found some alcohol in the house and did not share the information with anyone in support group "when I should have told someone and poured it out.".  Patient was interested in discussion on which is more important in maintaining sobriety, willingness or desire.    [name] 06/07/2012, 3:40 PM

## 2012-06-07 NOTE — Tx Team (Signed)
Interdisciplinary Treatment Plan Update (Adult)  Date:  06/07/2012  Time Reviewed:  9:56 AM   Progress in Treatment: Attending groups: Yes Participating in groups:  Yes Taking medication as prescribed: Yes Tolerating medication:  Yes Family/Significant othe contact made:  Yes, contact made with pt's husband Patient understands diagnosis:  Yes Discussing patient identified problems/goals with staff:  Yes Medical problems stabilized or resolved:  Yes Denies suicidal/homicidal ideation: Yes Issues/concerns per patient self-inventory:  None identified Other: N/A  New problem(s) identified: None Identified  Reason for Continuation of Hospitalization: Stable to d/c  Interventions implemented related to continuation of hospitalization: Stable to d/c Additional comments: N/A  Estimated length of stay: D/C today  Discharge Plan: Pt will return to CDIOP for SA treatment.    New goal(s): N/A  Review of initial/current patient goals per problem list:    1.  Goal(s): Address substance use  Met:  Yes  Target date: by discharge  As evidenced by: completed detox protocol and referred to appropriate treatment  2.  Goal (s): Reduce depressive and anxiety symptoms  Met:  Yes  Target date: by discharge  As evidenced by: Reducing depression from a 10 to a 3 as reported by pt.  Pt denies having depression and anxiety.   3.  Goal(s): Eliminate SI  Met:  Yes  Target date: by discharge  As evidenced by: Pt denies SI   Attendees: Patient:  Natalie Mcdaniel 06/07/2012 9:58 AM   Family:     Physician:  Lupe Carney, DO 06/07/2012 9:56 AM   Nursing: Isaac Laud, RN 06/07/2012 9:56 AM   Case Manager:  Reyes Ivan, LCSWA 06/07/2012  9:56 AM   Counselor:  Ronda Fairly, LCSWA 06/07/2012  9:56 AM   Other:  Richelle Ito, LCSW 06/07/2012 9:56 AM   Other:  Nestor Ramp, RN 06/07/2012 9:58 AM   Other:     Other:      Scribe for Treatment Team:   Reyes Ivan 06/07/2012 9:56 AM

## 2012-06-07 NOTE — Progress Notes (Signed)
Discharge Nursing Notes: D Patient discharged per MD order.  She received prescriptions and samples of medications.  Reviewed all discharge instructions with patient and understood same.  She was eager to attend AA meetings upon discharge and her first IOP meeting was today.  Patient was pleasant and cooperative.  She is vested in her recovery.  She denies any SI/HI/AVH.  A  Patient received belongings and left ambulatory for IOP.  R  Patient responded well to treatment.  Eager for continued treatment through IOP.

## 2012-06-07 NOTE — Progress Notes (Signed)
[facility] Case Management Discharge Plan:  Will you be returning to the same living situation after discharge: Yes,  return to own home At discharge, do you have transportation home?:Yes,  has transportation home Do you have the ability to pay for your medications:Yes,  access to meds  Release of information consent forms completed and in the chart;  Patient's signature needed at discharge.  Patient to Follow up at:  Follow-up Information    Follow up with [facility] - CDIOP on 06/07/2012. (Resume CDIOP today 1-4 pm)    Contact information:   [address] [place], [place] [address] [phone]      Follow up with AA meetings. (See brochure for meeting times and locations)          Patient denies SI/HI:   Yes,  denies SI/HI    Safety Planning and Suicide Prevention discussed:  Yes,  discussed with pt today  Barrier to discharge identified:No.  Summary and Recommendations: Pt attended discharge planning group and actively participated.  Pt presents with calm mood and affect.  Pt denies having depression, anxiety and SI.  Pt was open with sharing reason for entering the hospital.  Pt states that she was in CDIOP on Friday and Jorje Guild, PA walked her up here for inpatient.  Pt states that she relapsed on alcohol.  Pt states that she will return to her home AA meeting and CDIOP today.  Pt reports feeling stable to d/c today.  No recommendations from SW.  No further needs voiced by pt.  Pt stable to discharge.     Carmina Miller 06/07/2012, 10:11 AM

## 2012-06-08 NOTE — Progress Notes (Signed)
Patient Discharge Instructions:  After Visit Summary (AVS):   Access to EMR:  06/08/2012 Psychiatric Admission Assessment Note:   Access to EMR:  06/08/2012 Suicide Risk Assessment - Discharge Assessment:   Access to EMR:  06/08/2012 Next Level Care Provider Has Access to the EMR, 06/08/2012  Records provided via [facility]/Epic for Outpatient for follow up.   Karleen Hampshire Brittini, 06/08/2012, 1:18 PM

## 2012-06-09 ENCOUNTER — Other Ambulatory Visit (HOSPITAL_COMMUNITY): Payer: Managed Care, Other (non HMO)

## 2012-06-11 ENCOUNTER — Encounter (HOSPITAL_COMMUNITY): Payer: Self-pay

## 2012-06-11 ENCOUNTER — Other Ambulatory Visit (HOSPITAL_COMMUNITY): Payer: Managed Care, Other (non HMO) | Admitting: Psychology

## 2012-06-11 DIAGNOSIS — F1020 Alcohol dependence, uncomplicated: Secondary | ICD-10-CM
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
## 2012-06-14 ENCOUNTER — Other Ambulatory Visit (HOSPITAL_COMMUNITY): Payer: Managed Care, Other (non HMO) | Admitting: Psychology

## 2012-06-14 DIAGNOSIS — F1920 Other psychoactive substance dependence, uncomplicated: Secondary | ICD-10-CM

## 2012-06-14 NOTE — Progress Notes (Signed)
    Daily Group Progress Note  Program: CD-IOP   Group Time: 1-2:30 pm  Participation Level: Active  Behavioral Response: Appropriate  Type of Therapy: Psycho-education Group  Topic: Healthy, Positive Emotions and Heart Rate: A presentation was provided explaining the association between one's emotional state and one's heart rate. Handouts were provided that provided further explanation on the heart rate patterns that are typical of feelings of appreciation and other positive feelings. One's heart rate, in turn, relates to clear decision making, enhanced communication and better physical health and balance. The group responded positively to this presentation and an exercise was practiced by all that included slow long breaths in and out. There was good feedback among group members and they agreed to practice this exercise over the weekend. Breathalyzers were collected from all group members during this half of group.   Group Time: 2:45- 4pm  Participation Level: Active  Behavioral Response: Appropriate and Sharing  Type of Therapy: Process Group  Topic: Group Process and Graduation: the second half of group was spent in process. Members discussed their current struggles and issues in early recovery. Many voiced problems with family members and lack of patience. As the session neared an end, a graduation ceremony was conducted honoring a successfully graduating member. There were kind and supportive words among members with  the graduating member ending the session with heartfelt words of gratitude.   Summary:The patient reported she is attending meetings and has secured a sponsor. She confirmed that her family, including husband, herself and 74 yo daughter, attended the Al-Anon meeting last night. There were lots of men for which she is grateful since her husband is able to relate to their issues and feels more comfortable in the group. She reported that her husband gives her the Antabuse  pill every morning before he goes to work and it makes them both feel better. The patient talked about her stresses with work and how there would probably be over "3,000 emails" waiting for her when she finally returns. Her fellow group members urged her to not worry about those and focus on today. The patient made some good comments and has made considerable progress in accepting her alcohol dependence since the past weekend, when she went into detox for a few days. The patient's breathalyzer was negative.    Family Program: Family present? No   Name of family member(s):   UDS collected: No Results:   AA/NA attended?: YesMonday, Tuesday, Wednesday, Thursday, Saturday and Sunday  Sponsor?: No   Marrisa Kimber, LCAS

## 2012-06-15 ENCOUNTER — Encounter (HOSPITAL_COMMUNITY): Payer: Self-pay | Admitting: Psychology

## 2012-06-15 LAB — PRESCRIPTION ABUSE MONITORING 17P, URINE
Benzodiazepine Screen, Urine: POSITIVE ng/mL — ABNORMAL HIGH
Buprenorphine, Urine: NEGATIVE ng/mL
Creatinine, Urine: 98.14 mg/dL (ref >20.0)
Meperidine, Ur: NEGATIVE ng/mL
Methadone Screen, Urine: NEGATIVE ng/mL
Oxycodone Screen, Ur: NEGATIVE ng/mL
Propoxyphene: NEGATIVE ng/mL
Zolpidem, Urine: NEGATIVE ng/mL

## 2012-06-15 LAB — ALCOHOL METABOLITE (ETG), URINE: Ethyl Glucuronide (EtG): NEGATIVE ng/mL

## 2012-06-15 NOTE — Progress Notes (Signed)
Patient ID: Natalie Mcdaniel, female   DOB: 01-18-1966, 46 y.o.   MRN: [id number] Treatment Plan Update: Met with the patient this morning to review her treatment plan. Much has happened since [place] first entered the program. Shortly after beginning the program, she relapsed. After a number of relapses, the patient met with the Medical Director on June 14th and agreed to enter detox. She was discharged 3 days later and returned to the program renewed and more accepting that she was an alcoholic and could not control her drinking. She has embraced the 12-step community and is attending up to 2 meetings per day. The patient's sobriety remains June 15th. She is building a support network and is beginning to make friends in recovery. She asked to be prescribed Antabuse while in the most recent detox and is also prescribed Trazadone and Zoloft. She seems to be on a committed path and today reported she is very happy and feels good about her life. The patient was able to identify external (Total Wine, 4-7 pm) triggers and internal (boredom, loneliness, stress) triggers. We discussed strategies to address these triggers and she noted that she had entered all of the phone numbers for recovering people in her cell phone and listed them all under AA. That way they are the first phone numbers to come up and make it easier to contact another recovering person when she is struggling. The patient will remain in the program for approximately 3-4 more weeks. Upon discharged she will be referred back to the PA who has managed her care, Theora Gianotti, at [facility] and encouraged to remain active and engaged in the 12-step community. All documentation was reviewed, signed, and the updated plan completed accordingly.

## 2012-06-15 NOTE — Progress Notes (Signed)
    Daily Group Progress Note  Program: CD-IOP   Group Time: 1-2:30 pm  Participation Level: Active  Behavioral Response: Appropriate  Type of Therapy: Psycho-education Group  Topic:Group Process: First half of group session was spent in process. Members checked-in, identified their sobriety dates, and shared about current struggles. While most of the group maintained their previous sobriety dates, two group members had used since their last session. Members provided feedback and discussed ways they have dealt with cravings and stressors.    Group Time: 2:45- 4pm  Participation Level: Active  Behavioral Response: Appropriate and Sharing  Type of Therapy: Psycho-education Group  Topic: "Telling on Yourself" in Early Recovery: Second half of group consisted of presentation on the importance of honesty in early recovery. The complete honesty in recovery was contrasted with the lying and deception that accompanies active addiction. Group members participated in this presentation and offered examples of how they have caught themselves in lies or how they have told on themselves as examples. One member was questioned about her 'acceptance' of her CD, but all other group members confirmed they knew that they were addicts and there could be no social or 'occasional' use. There was good discloses and examples provided to validate the essential nature of 'transparency' in early recovery.   Summary: The patient reported she had doubts about her chemical dependency diagnosis, but after the past 2 weeks and her brief stint in detox, she recognizes that there is no such thing as a glass or two of wine for her. She reported she is attending AA meetings and prefers the [employer]. She was surprised to hear that she could have more than 1 home group and had been hesitant to commit because she enjoys 2 weekly meetings in particular. The patient questioned another member having to identify a new  sobriety date because she took Adderall. Adreanne noted that her daughter takes it and became alarmed to think her daughter was getting high. The patient's fellow group members explained ADD and how stimulants assist patients in becoming more focused. She reminded the group that her husband gives her the Antabuse pill every morning, but she complained that it has caused diarrhea. Another group member who was taking it has assured her that it will go away once her body adjusts to the medication. Ilayda also admitted that she had walked into [employer] this morning and was surprised to see a large display with wine in the front of the store. She reported she had felt something physical within her and wondered what this was? The group explained how just seeing one's drug of addiction can generate physiological cravings. The patient has made good progress in her understanding and acceptance of her addiction, despite appearing very nave at times, and she is actively engaged in the 12-step community.    Family Program: Family present? No   Name of family member(s):   UDS collected: Yes Results:not back yet  AA/NA attended?: YesMonday, Tuesday, Wednesday, Thursday, Friday, Saturday and Sunday  Sponsor?: Yes   Shikita Vaillancourt, LCAS

## 2012-06-16 ENCOUNTER — Other Ambulatory Visit (HOSPITAL_COMMUNITY): Payer: Managed Care, Other (non HMO) | Admitting: Psychology

## 2012-06-16 LAB — BENZODIAZEPINES (GC/LC/MS), URINE
Alprazolam metabolite (GC/LC/MS), ur confirm: NEGATIVE NG/ML
Clonazepam metabolite (GC/LC/MS), ur confirm: NEGATIVE NG/ML
Diazepam (GC/LC/MS), ur confirm: NEGATIVE NG/ML
Flunitrazepam metabolite (GC/LC/MS), ur confirm: NEGATIVE NG/ML
Flurazepam metabolite (GC/LC/MS), ur confirm: NEGATIVE NG/ML
Nordiazepam (GC/LC/MS), ur confirm: NEGATIVE NG/ML
Oxazepam (GC/LC/MS), ur confirm: 181 NG/ML — ABNORMAL HIGH
Temazepam (GC/LC/MS), ur confirm: NEGATIVE NG/ML

## 2012-06-16 NOTE — Progress Notes (Signed)
    Daily Group Progress Note  Program: CD-IOP   Group Time: 1-2:30 pm  Participation Level: Active  Behavioral Response: Sharing, Rationalizing, Evasive and Minimizing  Type of Therapy: Process  Topic: The first part of group was spent in process. Members shared their current difficulties and experiences in early recovery. One member admitted she had drunk this morning around 8 am, but was not intoxicated. She displayed resistance, denial and minimization as her fellow group members shared about their own experiences in treatment and encouraged her to consider detox and then residential treatment. A new member had arrived today and he shared a brief history of his addictive self. He noted that attending a 28 day program at [facility] was the 'best experience I have ever had". He was particularly encouraging to the member who disclosed having drunk wine this morning. There was good disclosure by members and while a number were frustrated by the denial and rationalization displayed by one group member, it was a good learning experience and allowed them to address these cognitive distortions so frequently expressed by those struggling with CD.   Group Time: 2:45- 4pm  Participation Level: Active  Behavioral Response: Minimizing  Type of Therapy: Psycho-education Group  Topic: The Difference between Treatment and Recovery: a presentation was provided differentiating between treatment and recovery. A handout was included identifying the differences. While treatment is short-term, recovery is a life-long process. The 4 essential aspects of recovery include: Acceptance, Honesty, Open-Mindedness and Willingness. Members discussed their own experiences in treatment and the difficulties and successes they have known in recovery.  Summary: The patient arrived late for the group session. She was asked to check-in and she admitted that she had had a glass of wine this morning around 8 am. She admitted that  she had relapsed and was really struggling. She noted she had gone online to work yesterday and been overwhelmed with over 1,000 emails. One member suggested she might want to go to rehab. She wondered what that was and at least 2 different group members explained the process of residential treatment. The new group member noted that he had just gotten out of treatment and found it to be the best thing he had ever done. The patient was very resistant to the thought of going to detox or rehab, but when asked why she was fighting it so hard, she explained that her husband and daughter would have to eat carry-out in her absence. Her fellow group members were stunned with the degree of her rationalization and denial. This issue continued for quite some time, but the patient was resistant to the recommendations and encouragement of her fellow group members. I encouraged her to consider going back to detox and reminded her she would meet with the medical director on Friday. The patient lacks insight into the depth of her disease as well as displaying strong denial about her loss of control.   Family Program: Family present? No   Name of family member(s):   UDS collected: No Results:  AA/NA attended?: No  Sponsor?: No   Tracker Mance, LCAS

## 2012-06-17 NOTE — Progress Notes (Addendum)
    Daily Group Progress Note  Program: CD-IOP   Group Time: 1-2;30 pm  Participation Level: Active  Behavioral Response: Appropriate  Type of Therapy: Psycho-education Group  Topic: Denial: A presentation was provided on denial and its role in addiction and recovery. Nine specific types of denial were outlined and group members were provided a handout and asked to identify examples of how they used these most common defenses. There was good disclosure by all group members and stories of how they had used denial to avoid detection from loved ones as well as how they had used denial on themselves. Emphasis was placed on the importance of recognizing when one begins to use one of these denial tactics and immediately challenge the tactic through self-talk as well as disclosure to others in recovery.  Group Time: 2:45- 4pm  Participation Level: Active  Behavioral Response: Sharing  Type of Therapy: Process Group  Topic:Group Process and Introduction: the second half of group was spent in process. Members shared about their current issues and experiences in early recovery. Two former group members came to the session and discussed how they have remained abstinent since leaving the program. A new member had arrived today and she was asked to introduce herself and describe what had brought her to the program. There was excellent support and feedback provided by members and the session proved very effective.    Summary: The patient was attentive and shared openly in the session on denial. Tamico offered examples of ways she had used these various types of denial. She described the minimizing, rationalizing and even bargaining with her husband. In bargaining, she explained that she had told him she would only drink on holidays and special occasions like Mother's Day. He had agreed, but had not been pleased. The patient admitted her tolerance had grown rapidly and she was able to consume well over a  bottle of wine. In process, she pointed out that her father had been an alcoholic and had managed to keep his job at [employer] for 35 years despite his addiction. She understood that as the daughter of an alcoholic, she was at much higher risk of developing an addiction. The patient provided good feedback and displays a growing understanding of addiction and what she needs to do to remain sober.   Family Program: Family present? No   Name of family member(s):   UDS collected: No Results:   AA/NA attended?: YesWednesday, Thursday, Friday, Saturday and Sunday  Sponsor?: Yes   Jarman Litton, LCAS

## 2012-06-18 ENCOUNTER — Other Ambulatory Visit (HOSPITAL_COMMUNITY): Payer: Managed Care, Other (non HMO) | Admitting: Psychology

## 2012-06-21 ENCOUNTER — Other Ambulatory Visit (HOSPITAL_COMMUNITY): Payer: Managed Care, Other (non HMO) | Attending: Physician Assistant | Admitting: Psychology

## 2012-06-21 DIAGNOSIS — E669 Obesity, unspecified: Secondary | ICD-10-CM | POA: Insufficient documentation

## 2012-06-21 DIAGNOSIS — IMO0002 Reserved for concepts with insufficient information to code with codable children: Secondary | ICD-10-CM | POA: Insufficient documentation

## 2012-06-21 DIAGNOSIS — F411 Generalized anxiety disorder: Secondary | ICD-10-CM | POA: Insufficient documentation

## 2012-06-21 DIAGNOSIS — F1020 Alcohol dependence, uncomplicated: Secondary | ICD-10-CM | POA: Insufficient documentation

## 2012-06-21 DIAGNOSIS — F10988 Alcohol use, unspecified with other alcohol-induced disorder: Secondary | ICD-10-CM | POA: Insufficient documentation

## 2012-06-21 DIAGNOSIS — Z9149 Other personal history of psychological trauma, not elsewhere classified: Secondary | ICD-10-CM | POA: Insufficient documentation

## 2012-06-21 DIAGNOSIS — Z9884 Bariatric surgery status: Secondary | ICD-10-CM | POA: Insufficient documentation

## 2012-06-21 DIAGNOSIS — F1920 Other psychoactive substance dependence, uncomplicated: Secondary | ICD-10-CM

## 2012-06-21 NOTE — Progress Notes (Signed)
    Daily Group Progress Note  Program: CD-IOP   Group Time: 1-2:30 pm  Participation Level: Active  Behavioral Response: Appropriate and Sharing  Type of Therapy: Process Group  Topic:  Process: First part of group spent in process. Members shared about their current struggles and obstacles in early recovery. a former member, who had graduated successfully a number of months ago, appeared for the group. She shared the things she had learned and had been doing to remain sober. There was good feedback and support provided by members. The visiting member had a very compelling story and appeared to be making excellent progress in her recovery.  Group Time: 2:45- 4pm  Participation Level: Active  Behavioral Response: Appropriate and Sharing  Type of Therapy: Psycho-education Group  Topic: "The Feeling Chart" and Graduation: second half of group included a psycho-educational presentation on the process of addiction. Members were provided with handouts on the transformation of feelings as one's drug use increased and the move from pleasure to agony. Members talked about their feelings and how things had changed as their drug use increased. One member noted that near the end of his use, there was no pleasure to be had, but only misery. Every member agreed with his assessment. At the end of this session, a graduation ceremony was held honoring a successfully graduating member. There were kind words of support and hope provided to her and the ceremony proved very emotional.   Summary: The patient reported she and her family had attended the Al-Anon meeting last night and it has proven very positive for all of them. She was very interested in the feeling process as identified in the handout. She admitted that her drinking had started with just a glass or possibly two of wine. But it had grown to where she was buying wine by the case and often times drinking more than a bottle in one day. When asked  how she had managed to feed her family dinner on the nights when she had drank, the patient reported she was in bed by the time her daughter and husband would come home and they would have to make do on their own. The patient offered kind words of support to the graduating member. She continues to make good progress in her recovery.  Family Program: Family present? No   Name of family member(s):   UDS collected: No Results:  AA/NA attended?: YesTuesday, Thursday and Saturday  Sponsor?: Yes   Kycen Spalla, LCAS

## 2012-06-22 LAB — PRESCRIPTION ABUSE MONITORING 17P, URINE
Amphetamine/Meth: NEGATIVE ng/mL
Barbiturate Screen, Urine: NEGATIVE ng/mL
Buprenorphine, Urine: NEGATIVE ng/mL
Carisoprodol, Urine: NEGATIVE ng/mL
Fentanyl, Ur: NEGATIVE ng/mL
Opiate Screen, Urine: NEGATIVE ng/mL
Oxycodone Screen, Ur: NEGATIVE ng/mL
Propoxyphene: NEGATIVE ng/mL
Tramadol Scrn, Ur: NEGATIVE ng/mL

## 2012-06-22 LAB — ALCOHOL METABOLITE (ETG), URINE: Ethyl Glucuronide (EtG): NEGATIVE ng/mL

## 2012-06-22 NOTE — Progress Notes (Addendum)
    Daily Group Progress Note  Program: CD-IOP   Group Time: 1-2:30 pm  Participation Level: Active  Behavioral Response: Appropriate  Type of Therapy: Process Group  Topic: Group Process: First half of group spent in process. Members shared the events of this past weekend. Most of the group reported having attended 12-step meetings. One of the newer members admitted she hasn't gone to a meeting yet and agreed that perhaps she was hesitant or ashamed to appear. A new group member, as of today, reported he didn't like them. He was asked to introduce himself and he described problems with cocaine. He explained that he was trying to get custody of his 54 month-old daughter and was told he should attend 2 12-step meetings per week, which he resented. He admitted he had "trust issues". There was good disclosure and feedback among the group and during the check-in, all reported having maintained their sobriety over the weekend.   Group Time: 2:45- 4pm  Participation Level: Active  Behavioral Response: Appropriate and Sharing  Type of Therapy: Psycho-education Group  Topic: Addiction: Segments of the HBO Film about Addiction was viewed. The two segments included the use of CBT in group therapy and the effects of Methamphetamine on the brain. These pieces were very informative and aided the group members in understanding the concepts behind the use of CBT as well as understanding the ways drugs and alcohol disrupt and alter brain function. There was good discussion and members shared their own concerns and experiences with the mental and emotional confusion and problems that have been caused by their drug use.    Summary: The patient reported she is doing well and attending meetings. She reported she had gone to the [employer] at the [employer] on Saturday. She also goes to the 5 pm at [employer] every evening. She had gone to the gym today at 5 am as is her usual routine. She reported she  has gained good insight into her addiction and feels better about her ability to resist the urge to drink. She also has phone numbers and has met women in AA meetings that really seem to care about her. During the second half of group, the patient seems to be confused or not relate to the things shared by some of the drug users, including using cocaine IV. She cannot comprehend this type of experience and seems to consider her alcohol use considerably more benign than other "hard" drugs. The patient is doing better and has responded quite well to the program and recovery despite being somewhat naive. She made some good comments.    Family Program: Family present? No   Name of family member(s):   UDS collected: Yes Results: positive for Benzodiazepines  AA/NA attended?: YesTuesday, Thursday, Saturday and Sunday  Sponsor?: Yes   Cuahutemoc Attar, LCAS

## 2012-06-23 ENCOUNTER — Other Ambulatory Visit (HOSPITAL_COMMUNITY): Payer: Managed Care, Other (non HMO)

## 2012-06-23 LAB — BENZODIAZEPINES (GC/LC/MS), URINE
Clonazepam metabolite (GC/LC/MS), ur confirm: NEGATIVE NG/ML
Flunitrazepam metabolite (GC/LC/MS), ur confirm: NEGATIVE NG/ML
Flurazepam metabolite (GC/LC/MS), ur confirm: NEGATIVE NG/ML
Lorazepam (GC/LC/MS), ur confirm: NEGATIVE NG/ML
Midazolam (GC/LC/MS), ur confirm: NEGATIVE NG/ML
Temazepam (GC/LC/MS), ur confirm: NEGATIVE NG/ML
Triazolam metabolite (GC/LC/MS), ur confirm: NEGATIVE NG/ML

## 2012-06-25 ENCOUNTER — Other Ambulatory Visit (HOSPITAL_COMMUNITY): Payer: Managed Care, Other (non HMO)

## 2012-06-28 ENCOUNTER — Other Ambulatory Visit (HOSPITAL_COMMUNITY): Payer: Managed Care, Other (non HMO)

## 2012-06-30 ENCOUNTER — Other Ambulatory Visit (HOSPITAL_COMMUNITY): Payer: Managed Care, Other (non HMO) | Admitting: Psychology

## 2012-07-01 LAB — ALCOHOL METABOLITE (ETG), URINE: Ethyl Glucuronide (EtG): NEGATIVE ng/mL

## 2012-07-01 LAB — PRESCRIPTION ABUSE MONITORING 17P, URINE
Amphetamine/Meth: NEGATIVE ng/mL
Buprenorphine, Urine: NEGATIVE ng/mL
Cannabinoid Scrn, Ur: NEGATIVE ng/mL
Carisoprodol, Urine: NEGATIVE ng/mL
Creatinine, Urine: 99.96 mg/dL (ref >20.0)
Meperidine, Ur: NEGATIVE ng/mL
Methadone Screen, Urine: NEGATIVE ng/mL
Opiate Screen, Urine: NEGATIVE ng/mL
Propoxyphene: NEGATIVE ng/mL

## 2012-07-02 ENCOUNTER — Other Ambulatory Visit (HOSPITAL_COMMUNITY): Payer: Managed Care, Other (non HMO) | Admitting: Psychology

## 2012-07-02 LAB — BENZODIAZEPINES (GC/LC/MS), URINE
Alprazolam metabolite (GC/LC/MS), ur confirm: NEGATIVE NG/ML
Flunitrazepam metabolite (GC/LC/MS), ur confirm: NEGATIVE NG/ML
Flurazepam metabolite (GC/LC/MS), ur confirm: NEGATIVE NG/ML
Midazolam (GC/LC/MS), ur confirm: NEGATIVE NG/ML
Oxazepam (GC/LC/MS), ur confirm: 98 NG/ML — ABNORMAL HIGH
Temazepam (GC/LC/MS), ur confirm: NEGATIVE NG/ML

## 2012-07-02 NOTE — Progress Notes (Signed)
    Daily Group Progress Note  Program: CD-IOP   Group Time: 1-2:30 pm  Participation Level: Active  Behavioral Response: Appropriate  Type of Therapy: Psycho-education Group  Topic: Family Day: today's session included family members. There were 2 mothers, 1 father, a fiance, a wife and a husband. A brief PowerPoint presentation was made about addiction and how triggers are developed and generate cravings. The emphasis was on the biological and brain chemistry aspects of addiction. Following the presentation was a lively discussion about triggers and how members deal with cravings.  Group Time: 2:45- 4 pm  Participation Level: Active  Behavioral Response: Sharing  Type of Therapy: Process Group  Topic: Process; in the second half of group, family members were asked to share their feelings about their loved ones and the struggles that this disease has created for everyone. Some expressed frustration about their loved one's addiction while others had feelings of guilty and loss. One member had arrived late and had told a story that the other group members did not accept as true. There were expressions of concern for this young man and one member reminded him that "you can't bullshit an addict". The importance of calling each other out was discussed and each group member agreed that this is what they wanted and needed to hear. The session with family expressing their feelings about having been here and almost everyone agreed it had been a positive experience.   Summary: The patient arrived with her husband, Chanetta Marshall. She reported she had remained sober while visiting [place] and her family's annual reunion. She noted that part of her family drank while the other half did not. Chanetta Marshall was asked to share how he felt about his wife's alcohol use? He reported he had gotten so he didn't want to come home at the end of the day because he would find her drunk. He is much happier now that she is sober  and he shared how every morning at 5 am he gets up for work, but brings his wife coffee and her Antabuse pill. This is a new morning ritual and they both feel better about it. The patient was very attentive and made some excellent comments to the group member who appeared to be lying about his drug use. She noted that he didn't seem to have experienced any consequences so it was not surprising that he has continued to use and display addictive impulsive behaviors. The patient made some excellent comments and appears to be making good progress in her recovery.  Family Program: Family present? Yes   Name of family member(s): husband, Chanetta Marshall  UDS collected: Yes Results: not back yet  AA/NA attended?: Yes, just returned from vacation  Sponsor?: Yes   Mailee Klaas, LCAS

## 2012-07-05 ENCOUNTER — Other Ambulatory Visit (HOSPITAL_COMMUNITY): Payer: Managed Care, Other (non HMO) | Admitting: Psychology

## 2012-07-05 NOTE — Progress Notes (Signed)
    Daily Group Progress Note  Program: CD-IOP   Group Time: 1-2:30 pm  Participation Level: Active  Behavioral Response: Appropriate and Sharing  Type of Therapy: Process Group  Topic: Group Process and Confession: the first half of group was spent discussing the previous session with family members. The group response was that the session had been very helpful for them. One member noted that his wife had gained good insight into the issue of addiction and family and seemed to be changed for the better. The results of the most recent drug test (from Monday) were passed out and they were congruent with members' disclosure except for one. The member admitted that he had taken some Xanax. The group was surprised to learn that he had done this, did not appear to have practiced any of the refusal or coping skills, and further, that he had not shared this information with the group. This disclosure generated the importance of communicating and challenging one's thoughts. Despite his denial, the patient appeared to be very ashamed and did not make eye contact for the next 15 minutes. The disease or medical condition from which he suffers was emphasized and the patient encouraged to not berate himself for having used or kept it a secret, but to try and be more transparent and fight the urges when they present.  Group Time: 2:45-4pm  Participation Level: Active  Behavioral Response: Sharing, but naive  Type of Therapy: Psycho-education Group  Topic:Communication: Fair Charity fundraiser: second half of group session was spent discussing healthy ways to communicate. The session included a handout on 'Fair Fighting Rules'. The group discussed these recommendations and practiced how they could be used with family and friends. The importance of communicating effectively is directly related to getting one's needs met more frequently, being happier and more fulfilled, and more likely to remain sober and not  relapse. One member reported he wished he had learned about these 30 years ago. There was good discussion and sharing among the members.    Summary:The patient reported she had enjoyed the last session, but she wasn't sure her husband got anything out of it. Another member noted that he had spoken with Chanetta Marshall and he seemed to care very strongly about his wife. She smirked, but didn't say anything in response. The patient expressed frustration over the absent group member who appears to be in his active addiction and felt like the group had done well challenging him. She seemed a little nave about the dishonesty displayed when in one's active addiction and was surprised at what some people do while they are using. She reported she will attend meetings this weekend and go out and eat with her family after church on Sunday. She had no explanation for her positive UA and reported she is taking Wellbutrin and Trazadone as prescribed and nothing else. The patient has displayed good insight about her own addiction to alcohol, but is surprisingly confused by members who have additions to other chemicals.  Family Program: Family present? No   Name of family member(s):   UDS collected: No Results:   AA/NA attended?: YesTuesday, Thursday, Saturday and Sunday  Sponsor?: Yes   Alethia Melendrez, LCAS

## 2012-07-06 NOTE — Progress Notes (Signed)
    Daily Group Progress Note  Program: CD-IOP   Group Time: 1-2:30 pm  Participation Level: Active  Behavioral Response: Sharing  Type of Therapy: Process Group  Topic: Group Process and Introductions: first half of session spent in process. The group discussed the events of the past weekend and per reports, everyone remained abstinent. There were 2 new members in group today and they introduced themselves. They displayed powerful emotions about events that have led them to this program and the group gave them a warm welcome. There was good sharing and feedback among the group.  Group Time: 2:45-4 pm  Participation Level: Active  Behavioral Response: Appropriate  Type of Therapy: Psycho-education Group  Topic: Addiction: A Biological, Psychological, Social and Spiritual Disease. Second half of group included a psycho-educational piece. A presentation was provided on the 4 main elements that contribute to addiction. While members clearly understood that they could not do anything about their genetic predisposition to addiction, the other 3 major elements can be addressed directly and behaviors changed accordingly. Members expressed the frustration they feel when society stigmatizes them for being weak-willed or lacking in discipline. Emphasis was placed on group members understanding and accepting they have a biologically-based illness and it has nothing to do with lack of willpower or effort. One of the new members is a Type 1 Diabetic and she explained what she has to do throughout the day to maintain proper and healthy blood sugar levels. The purpose of the session was to educate and empower group members to recognize they can actively address this chronic disease and put it and keep it in remission.   Summary: The patient reported she had a good weekend. She had attended 2 women's meetings on Saturday and went to church with her family on Sunday. She reported she feels good and is  doing well in her recovery. When discussing the importance of scheduling and becoming more accountable, she [facility] and does her weights and machines. She feels better about herself and keeps her weight down. The patient reported she is very careful about her diet and eats well and steadily. Of all the group members, Oluwaseyi probably eats better than anyone else. She had a gastric bypass a number of years ago and has maintained her weight and done what she has been instructed to do. She agreed that her spiritual practices went out the window when her drinking increased. She stopped going to church and had to give up certain responsibilities because she was hung over and she felt very guilty. "I knew what I was doing was wrong", but I couldn't stop it. The patient made some good comments and responded well to this intervention.   Family Program: Family present? No   Name of family member(s):   UDS collected: No Results:   AA/NA attended?: YesTuesday, Thursday and Saturday  Sponsor?: Yes   Raesean Bartoletti, LCAS

## 2012-07-07 ENCOUNTER — Other Ambulatory Visit (HOSPITAL_COMMUNITY): Payer: Managed Care, Other (non HMO) | Admitting: Psychology

## 2012-07-09 ENCOUNTER — Other Ambulatory Visit (HOSPITAL_COMMUNITY): Payer: Managed Care, Other (non HMO) | Admitting: Psychology

## 2012-07-09 NOTE — Progress Notes (Signed)
    Daily Group Progress Note  Program: CD-IOP   Group Time: 1-2:30  Participation Level: Active  Behavioral Response: Appropriate  Type of Therapy: Psycho-education Group  Topic:Shame: first part of group was presentation on shame. This psycho-educational piece focused on the development of shame and how it perpetuates one's addiction. The feeling that one is fundamentally flawed leads to fear that others will recognize this fundamental flaw and the pain and terror associated with this recognition. As a result, the shamed individual learns to isolate in order to distance themselves from others. This generates an ongoing insidious cycle of shame, fear and isolation. Members discussed their shame and how it has led to them seeking alcohol and drugs to numb their pain. The way out of this destructive cycle was identified and that includes breaking out of the isolation and meeting others struggling with these same issues. They are found within the [facility] 12-step community. A number of members agreed that they have found support and love in "the rooms" and is has given them a sense of belonging and connection that was lacking in the past.   Group Time: 2:45-4pm  Participation Level: Active  Behavioral Response: Appropriate and Sharing  Type of Therapy: Process Group  Topic: Building a Recovery Tool Checklist/Process: the second half of group was spent in further education and a handout was provided with a checklist of recovery tools listed on a weekly calendar. Members discussed all of those listed and were provided additional handouts with the tool section left blank so they could write-in their own tools. As the session ended, members shared about their current struggles and issues. One of the new members reported he had interviewed at the [facility] in the [place] neighborhood. He admitted he was a little hesitant about moving in because it as in an area that he had never lived  in before. It was an awkward explanation and he finally agreed that it was a low-income neighborhood that he found uncomfortable. A brief Pro vs. Con list was written on the board and in the end, he agreed there were no Cons except for the type of neighborhood. He thanked the group for their help.   Summary: The patient reported she was doing well and continues to attend her AA meetings. She agreed with the new member about the stressors in returning to work and reminded the group that the first day she returned after detox she had over 1,000 emails. It completely overwhelmed her and she started drinking again. The patient was challenged by another group member who expressed frustration that she generalizes and stereotypes too much. The patient ignored it, but other group members expressed the same frustration with her. The patient does appear very dogmatic at times and seems completely unaware of this tendency. She continues to do well in her recovery and is making good lifestyle changes.   Family Program: Family present? No   Name of family member(s):   UDS collected: No Results:  AA/NA attended?: YesTuesday, Wednesday, Thursday and Saturday  Sponsor?: Yes   Darshana Curnutt, LCAS

## 2012-07-12 ENCOUNTER — Other Ambulatory Visit (HOSPITAL_COMMUNITY): Payer: Managed Care, Other (non HMO)

## 2012-07-12 NOTE — Progress Notes (Signed)
    Daily Group Progress Note  Program: CD-IOP   Group Time: 1-2:30 pm  Participation Level: Active  Behavioral Response: Appropriate  Type of Therapy: Process Group  Topic: Process: first half of group was spent in process. Members shared about current difficulties and concerns. The two new group members met scheduled to meet with the medical director in individual sessions during group today. The meaning of "acceptance" was thrown into the mix with members asked to discuss what that means to them.  Group Time: 2:45-4pm  Participation Level: Minimal  Behavioral Response: Sharing  Type of Therapy: Psycho-education Group  Topic:Family Sculpture: the second half of group was spent with members "sculpting" their families. This exercise revealed facets of members' early life that were unknown up till now and, in many cases, similarly dysfunctional and frightening.  Members shared openly about their feelings as they viewed their sculpture and there was good feedback and validation provided by their fellow group members.   Summary: The patient reported she has been doing little jobs around the house anticipating having to return to work in the near future. These include painting her bathroom. She continues to meet with women in AA and attend meetings, including 2 meetings that she attends every Saturday along with the 6 pm nightly meeting at the [employer]. She made some good observations in the second half of group, but did not offer to sculpt her family. She remains sober and is making good progress in her recovery.  Family Program: Family present? No   Name of family member(s):   UDS collected: No Results:   AA/NA attended?: YesTuesday, Thursday and Saturday  Sponsor?: Yes   Gaylan Fauver, LCAS

## 2012-07-13 LAB — PRESCRIPTION ABUSE MONITORING 17P, URINE
Amphetamine/Meth: NEGATIVE ng/mL
Barbiturate Screen, Urine: NEGATIVE ng/mL
Benzodiazepine Screen, Urine: NEGATIVE ng/mL
Buprenorphine, Urine: NEGATIVE ng/mL
Carisoprodol, Urine: NEGATIVE ng/mL
Fentanyl, Ur: NEGATIVE ng/mL
Oxycodone Screen, Ur: NEGATIVE ng/mL
Propoxyphene: NEGATIVE ng/mL
Zolpidem, Urine: NEGATIVE ng/mL

## 2012-07-13 LAB — ALCOHOL METABOLITE (ETG), URINE: Ethyl Glucuronide (EtG): NEGATIVE ng/mL

## 2012-07-14 ENCOUNTER — Other Ambulatory Visit (HOSPITAL_COMMUNITY): Payer: Managed Care, Other (non HMO) | Admitting: Psychology

## 2012-07-15 NOTE — Progress Notes (Signed)
    Daily Group Progress Note  Program: CD-IOP   Group Time: 1-2:30 pm  Participation Level: Active  Behavioral Response: Appropriate  Type of Therapy: Psycho-education Group  Topic: "Be Smart, Not Strong": First half of group included psycho-education on the power of the addiction over the power of one's thinking. Chemically dependent people cannot expect their will power to keep them drug-free and it is imperative that they recognize high risk situations and avoid them. The handout provided was from the [facility]. It included identifying where one rates themselves on 11 different relapse prevention strategies. The totals were identified by each member and each group member identified where they needed to improve and strengthen their recovery strategies.  Group Time: 2:45- 4pm  Participation Level: Active  Behavioral Response: Sharing  Type of Therapy: Process Group  Topic: People on My Side of Recovery/Process; second half of group spent in process. Members shared their current difficulties and struggles in early recovery. at one point, members were asked to list the professionals, family members, friends, support groups, sponsor, and others who would support them in maintaining sobriety. The numbers listed varied widely among the group with those having longer periods of sobriety identifying more people.   Summary: The patient reported she was doing well in her recovery. She scored high in the handout on her relapse prevention strategies and noted she has a lot of women she knows in recovery. She shared that she is attending the Happy Hour almost every week day at 5:30 at the [employer]. She reported that she has found that once she gets to know people and familiar faces at the meetings, she finds them comforting to her. She had a good list of different sources of support. In discussing the lifestyle changes that recovery entails, I pointed out that this patient has made those big  lifestyle changes in the past when she had a gastric bypass. The patient informed the group that she had been required to complete 1 year of counseling on nutrition prior to her surgery. She explained that the surgery would be a waste if she didn't eat differently and be more aware of nutritional needs. I reminded the group that this patient always reports the best meals when I asked members to report what they have eaten for the day. The patient continues to make good progress and her disclosures enhanced the efficacy of this session.   Family Program: Family present? No   Name of family member(s):   UDS collected: No Results:   AA/NA attended?: YesMonday, Tuesday, Wednesday, Thursday, Friday and Saturday  Sponsor?: Yes   Jermy Couper, LCAS

## 2012-07-16 ENCOUNTER — Other Ambulatory Visit (HOSPITAL_COMMUNITY): Payer: Managed Care, Other (non HMO) | Admitting: Psychology

## 2012-07-18 NOTE — Progress Notes (Signed)
    Daily Group Progress Note  Program: CD-IOP   Group Time: 1-2:30 pm  Participation Level: Active  Behavioral Response: Sharing  Type of Therapy: Process Group  Topic: Group Process and Introductions: first half of group was spent in process. There were 2 new members in group today, and they introduced themselves during this part of group. One of the new members questioned whether she was actually an alcoholic or just an abuser. This led to a discussion, including a review of the 7 different criteria for CD as listed in the DSM-IV. There was good disclosure and an active engagement among the members.  Group Time: 2:45- 4pm  Participation Level: Active  Behavioral Response: Assertive  Type of Therapy: Psycho-education Group  Topic: Pros and Cons: second half of group spent identifying the pros and cons of using versus sobriety. Each member was able to identify numerous negatives of active drug use as well as pros and cons of sobriety, but there were very few pros noted for using. I pointed out that this seemed odd since so many of the group members had continued to use despite many negative consequences. The general consensus was that the benefit of using was that it served to "numb" the user. The conversation was lively and  members shared openly about their own alcohol and drug use.   Summary: The patient reported she was doing well and had attended her 5:30 [employer] yesterday. She questioned the new group membrer about her drinking and couldn't accept that she wasn't an alcoholic, especially after hearing how much she drank when she did drink. In the second half of group, the patient noted that a con to her continued drinking was that her husband would leave her. The patient reported she had a yard sale scheduled for tomorrow and would not be in her AA meetings, but would go tonight to the [employer]. She continues to make good progress in her recovery.  Family Program:  Family present? No   Name of family member(s):   UDS collected: No Results:   AA/NA attended?: YesMonday, Tuesday, Wednesday, Thursday and Friday  Sponsor?: Yes   Ainsleigh Kakos, LCAS

## 2012-07-19 ENCOUNTER — Other Ambulatory Visit (HOSPITAL_COMMUNITY): Payer: Managed Care, Other (non HMO) | Admitting: Psychology

## 2012-07-20 NOTE — Progress Notes (Signed)
    Daily Group Progress Note  Program: CD-IOP   Group Time: 1-2:30 pm  Participation Level: Active  Behavioral Response: Appropriate  Type of Therapy: Psycho-education Group  Topic: Pharmacist: the first half of group was spent with a presentation by one of the [facility] staff pharmacists, Peggye Fothergill. The guest speaker reviewed the major categories of drugs and how they effect the brain. She also shared about withdrawal symptoms typical to each category. There was good discussion and disclosure with members either sharing their own experiences or describing how their experiences were congruent with her descriptions.   Group Time: 2:45-4 pm  Participation Level: Active  Behavioral Response: Sharing  Type of Therapy: Process Group  Topic:Pharmacist: the first half of group was spent with a presentation by one of the [facility] staff pharmacists, Peggye Fothergill. The guest speaker reviewed the major categories of drugs and how they effect the brain. She also shared about withdrawal symptoms typical to each category. There was good discussion and disclosure with members either sharing their own experiences or describing how their experiences were congruent with her descriptions.    Summary: Pharmacist: the first half of group was spent with a presentation by one of the [facility] staff pharmacists, Peggye Fothergill. The guest speaker reviewed the major categories of drugs and how they effect the brain. She also shared about withdrawal symptoms typical to each category. There was good discussion and disclosure with members either sharing their own experiences or describing how their experiences were congruent with her descriptions.   Family Program: Family present? No   Name of family member(s):   UDS collected: No Results  AA/NA attended?: No  Sponsor?: No   Jasiel Apachito, LCAS

## 2012-07-21 ENCOUNTER — Other Ambulatory Visit (HOSPITAL_COMMUNITY): Payer: Managed Care, Other (non HMO) | Attending: Psychiatry | Admitting: Psychology

## 2012-07-21 DIAGNOSIS — IMO0002 Reserved for concepts with insufficient information to code with codable children: Secondary | ICD-10-CM | POA: Insufficient documentation

## 2012-07-21 DIAGNOSIS — F1020 Alcohol dependence, uncomplicated: Secondary | ICD-10-CM | POA: Insufficient documentation

## 2012-07-21 DIAGNOSIS — Z9884 Bariatric surgery status: Secondary | ICD-10-CM | POA: Insufficient documentation

## 2012-07-21 DIAGNOSIS — E669 Obesity, unspecified: Secondary | ICD-10-CM | POA: Insufficient documentation

## 2012-07-21 DIAGNOSIS — F411 Generalized anxiety disorder: Secondary | ICD-10-CM | POA: Insufficient documentation

## 2012-07-21 DIAGNOSIS — F10988 Alcohol use, unspecified with other alcohol-induced disorder: Secondary | ICD-10-CM | POA: Insufficient documentation

## 2012-07-21 DIAGNOSIS — Z9149 Other personal history of psychological trauma, not elsewhere classified: Secondary | ICD-10-CM | POA: Insufficient documentation

## 2012-07-22 NOTE — Progress Notes (Signed)
    Daily Group Progress Note  Program: CD-IOP   Group Time: 1-2:30 pm   Participation Level: Active  Behavioral Response: Appropriate  Type of Therapy: Psycho-education Group  Topic: Your Medication is Like Oxygen; Everything in Your Recovery Matters: first half of session spent discussing the importance of every aspect of one's recovery. A former member had returned and shared that he had not taken the time to pick up his medication refills and how, after 2 days had passed, he began to fall apart and, subsequently, relapsed. This disclosure generated the importance of every aspect of one's recovery. I asked members to share what they had eaten today - it was almost 2:30 pm, and very few of the 9 members present had eaten well. A third had eaten acceptably, a third had eaten poorly and a third had eaten nothing. Emphasis was placed on the importance of balance and all aspects of good health in recovery.  Group Time: 2:45-4 pm  Participation Level: Active  Behavioral Response: Sharing  Type of Therapy: Process Group  Topic:Group Process: second half of group was spent in process. Members were asked what they were feeling at this moment, but most were unable to answer in a way that suggested they knew what I was asking. There was some disclosure about current issues and, after reading the daily reminder, members were asked what they were grateful for today. There was good disclosure and sharing among the group.   Summary: the patient reported things are going well and she will be starting back to work next week. She agreed that this time has been really good for her and she is grateful for being able to rest and reflect on her life. She reported a good diet and this is always the case since the patient follows a strict regiment following a gastric bypass about 10 years ago. The patient seems happy about her upcoming graduation and noted she is very engaged in the 12-step community and goes  to around 5 meetings per week. She has made good progress in her recovery.    Family Program: Family present? No   Name of family member(s):   UDS collected: No Results:  AA/NA attended?: YesMonday, Tuesday, Thursday and Saturday  Sponsor?: Yes   Asa Fath, LCAS

## 2012-07-23 ENCOUNTER — Encounter (HOSPITAL_COMMUNITY): Payer: Self-pay | Admitting: Psychology

## 2012-07-23 ENCOUNTER — Other Ambulatory Visit (HOSPITAL_COMMUNITY): Payer: Managed Care, Other (non HMO) | Attending: Psychiatry | Admitting: Psychology

## 2012-07-23 DIAGNOSIS — Z9884 Bariatric surgery status: Secondary | ICD-10-CM | POA: Insufficient documentation

## 2012-07-23 DIAGNOSIS — F411 Generalized anxiety disorder: Secondary | ICD-10-CM | POA: Insufficient documentation

## 2012-07-23 DIAGNOSIS — E669 Obesity, unspecified: Secondary | ICD-10-CM | POA: Insufficient documentation

## 2012-07-23 DIAGNOSIS — F1020 Alcohol dependence, uncomplicated: Secondary | ICD-10-CM | POA: Insufficient documentation

## 2012-07-23 DIAGNOSIS — F10988 Alcohol use, unspecified with other alcohol-induced disorder: Secondary | ICD-10-CM | POA: Insufficient documentation

## 2012-07-23 DIAGNOSIS — Z9149 Other personal history of psychological trauma, not elsewhere classified: Secondary | ICD-10-CM | POA: Insufficient documentation

## 2012-07-23 DIAGNOSIS — IMO0002 Reserved for concepts with insufficient information to code with codable children: Secondary | ICD-10-CM | POA: Insufficient documentation

## 2012-07-23 MED ORDER — DISULFIRAM 250 MG PO TABS: 250.0000 mg | ORAL_TABLET | Freq: Every day | ORAL | Status: DC

## 2012-07-26 NOTE — Progress Notes (Signed)
    Daily Group Progress Note  Program: CD-IOP   Group Time: 1-2:30 pm  Participation Level: Active  Behavioral Response: Appropriate  Type of Therapy: Psycho-education Group  Topic: "The Relapse Process and Interrupting It": a presentation was provided by the Medical Director, AW, and me on the relapse process and how one can interrupt or stop it. The process includes: trigger, thoughts, cravings, and use. Anywhere along that continuum, the process can be stopped, but the closer to using one gets, the more difficult it can become. One member discussed how he had been triggered by a radio ad about a [place] [employer] and their variety of tequila drinks. He admitted he was driving here to group and the ad had him experiencing cravings. Other members provided examples. The bulk of the session was spent identifying ways to disrupt this process and the importance of talking and sharing one's experience with others. There was good feedback and discussion among the group members.   Group Time: 2:45- 4pm  Participation Level: Active  Behavioral Response: Appropriate and Sharing  Type of Therapy: Process Group  Topic: Motivation for Recovery and Graduation: the second half of group was spent discussing a handout on the reasons for entering treatment? Members identified all sorts of reasons to stop using, including to avoid death, loss of family, loss of employment, and homelessness. Members seemed consistent in noting that their reasons for staying in treatment have grown since they first entered treatment. As the session neared the end, a celebration for a successfully graduating member was observed. There were kind words and cupcakes to celebrate this occasion. The member admitted that the program had been very helpful and she would miss the camaraderie and support of the group.   Summary: The patient had questions about whether she had experienced cravings. After describing how they develop  and may appear, the patient agreed that she had experienced cravings. She noted that as her drinking increased, she started missing her church services on Sunday and ultimately stopped going to the Bible Study as well. She reported that she has dinner with AA women on Wednesday evenings, but enjoys herself so much she doesn't leave in order to make the Wednesday evening Bible Study. She wondered whether this was wrong. The group provides good feedback and the medical director encouraged her to focus on her recovery and that she would eventually find a balance in her recovery practices and faith. The patient agreed that she had found a growing personal faith with God since getting into recovery and it was nice. She received good feedback and kind comments as she completed this program. She wished the group well and thanked them for allowing her this experience.   Family Program: Family present? No   Name of family member(s):   UDS collected: No Results:   AA/NA attended?: YesTuesday, Thursday, Friday and Saturday  Sponsor?: Yes   Avyukth Bontempo, LCAS

## 2012-07-26 NOTE — Progress Notes (Signed)
Patient ID: Natalie Mcdaniel, female   DOB: 08/14/1966, 46 y.o.   MRN: [id number] The patient successfully completed the CD-IOP today. She attended her last group and the graduation 'ceremony" was held applauding her progress and efforts in sobriety. She has a very good understanding of her daily recovery needs, a strong faith, and is actively engaged in the 12-step community. Her prognosis is good should she remain engaged in [place] and work her daily recovery plan. Please see Discharge Summary, dated 07/23/2012.

## 2012-09-07 ENCOUNTER — Encounter (HOSPITAL_COMMUNITY): Payer: Self-pay | Admitting: *Deleted

## 2012-09-07 ENCOUNTER — Inpatient Hospital Stay (HOSPITAL_COMMUNITY)
Admission: RE | Admit: 2012-09-07 | Discharge: 2012-09-12 | DRG: 897 | Disposition: A | Discharge status: Home / Self Care | Payer: Managed Care, Other (non HMO) | Attending: Psychiatry | Admitting: Psychiatry

## 2012-09-07 DIAGNOSIS — Z79899 Other long term (current) drug therapy: Secondary | ICD-10-CM

## 2012-09-07 DIAGNOSIS — F10239 Alcohol dependence with withdrawal, unspecified: Principal | ICD-10-CM | POA: Diagnosis not present

## 2012-09-07 DIAGNOSIS — F1020 Alcohol dependence, uncomplicated: Secondary | ICD-10-CM | POA: Diagnosis present

## 2012-09-07 DIAGNOSIS — F329 Major depressive disorder, single episode, unspecified: Secondary | ICD-10-CM

## 2012-09-07 DIAGNOSIS — F1994 Other psychoactive substance use, unspecified with psychoactive substance-induced mood disorder: Secondary | ICD-10-CM

## 2012-09-07 DIAGNOSIS — F603 Borderline personality disorder: Secondary | ICD-10-CM | POA: Diagnosis present

## 2012-09-07 DIAGNOSIS — F1010 Alcohol abuse, uncomplicated: Secondary | ICD-10-CM | POA: Diagnosis present

## 2012-09-07 DIAGNOSIS — F10939 Alcohol use, unspecified with withdrawal, unspecified: Principal | ICD-10-CM | POA: Diagnosis not present

## 2012-09-07 LAB — URINE MICROSCOPIC-ADD ON

## 2012-09-07 LAB — URINALYSIS, ROUTINE W REFLEX MICROSCOPIC
Bilirubin Urine: NEGATIVE
Glucose, UA: NEGATIVE mg/dL
Ketones, ur: NEGATIVE mg/dL
Nitrite: NEGATIVE
Specific Gravity, Urine: 1.015 (ref 1.005–1.030)
pH: 6 (ref 5.0–8.0)

## 2012-09-07 LAB — RAPID URINE DRUG SCREEN, HOSP PERFORMED
Barbiturates: NOT DETECTED
Benzodiazepines: NOT DETECTED
Cocaine: NOT DETECTED
Opiates: NOT DETECTED

## 2012-09-07 LAB — CBC WITH DIFFERENTIAL/PLATELET
Eosinophils Relative: 1 % (ref 0–5)
HCT: 38.6 % (ref 36.0–46.0)
Hemoglobin: 13.4 g/dL (ref 12.0–15.0)
Lymphocytes Relative: 43 % (ref 12–46)
Lymphs Abs: 3.4 10*3/uL (ref 0.7–4.0)
MCV: 88.3 fL (ref 78.0–100.0)
Monocytes Absolute: 0.4 10*3/uL (ref 0.1–1.0)
Monocytes Relative: 5 % (ref 3–12)
Neutro Abs: 4 10*3/uL (ref 1.7–7.7)
WBC: 7.8 10*3/uL (ref 4.0–10.5)

## 2012-09-07 LAB — COMPREHENSIVE METABOLIC PANEL
Albumin: 3.6 g/dL (ref 3.5–5.2)
Alkaline Phosphatase: 101 U/L (ref 39–117)
BUN: 6 mg/dL (ref 6–23)
Calcium: 9.1 mg/dL (ref 8.4–10.5)
GFR calc Af Amer: 89 mL/min — ABNORMAL LOW (ref >90)
Glucose, Bld: 83 mg/dL (ref 70–99)
Potassium: 4 mEq/L (ref 3.5–5.1)
Sodium: 139 mEq/L (ref 135–145)
Total Protein: 7 g/dL (ref 6.0–8.3)

## 2012-09-07 MED ORDER — SERTRALINE HCL 50 MG PO TABS
50.0000 mg | ORAL_TABLET | Freq: Every day | ORAL | Status: DC
  Administered 2012-09-07 – 2012-09-12 (×6): 50 mg via ORAL
  Filled 2012-09-07 (×9): qty 1

## 2012-09-07 MED ORDER — ALUM & MAG HYDROXIDE-SIMETH 200-200-20 MG/5ML PO SUSP: 30.0000 mL | ORAL | Status: DC | PRN

## 2012-09-07 MED ORDER — ADULT MULTIVITAMIN W/MINERALS CH
1.0000 | ORAL_TABLET | Freq: Every day | ORAL | Status: DC
  Administered 2012-09-07 – 2012-09-12 (×6): 1 via ORAL
  Filled 2012-09-07 (×9): qty 1

## 2012-09-07 MED ORDER — LOPERAMIDE HCL 2 MG PO CAPS: 2.0000 mg | ORAL_CAPSULE | ORAL | Status: DC | PRN

## 2012-09-07 MED ORDER — ONDANSETRON 4 MG PO TBDP
4.0000 mg | ORAL_TABLET | Freq: Four times a day (QID) | ORAL | Status: AC | PRN
  Administered 2012-09-07: 4 mg via ORAL

## 2012-09-07 MED ORDER — THIAMINE HCL 100 MG/ML IJ SOLN: 100.0000 mg | Freq: Once | INTRAMUSCULAR | Status: DC

## 2012-09-07 MED ORDER — CHLORDIAZEPOXIDE HCL 25 MG PO CAPS
25.0000 mg | ORAL_CAPSULE | Freq: Four times a day (QID) | ORAL | Status: AC | PRN
  Administered 2012-09-07 – 2012-09-08 (×2): 25 mg via ORAL
  Filled 2012-09-07 (×2): qty 1

## 2012-09-07 MED ORDER — HYDROXYZINE HCL 25 MG PO TABS
25.0000 mg | ORAL_TABLET | Freq: Four times a day (QID) | ORAL | Status: DC | PRN
  Administered 2012-09-07: 25 mg via ORAL

## 2012-09-07 MED ORDER — CHLORDIAZEPOXIDE HCL 25 MG PO CAPS
50.0000 mg | ORAL_CAPSULE | Freq: Three times a day (TID) | ORAL | Status: DC
  Administered 2012-09-08: 50 mg via ORAL
  Filled 2012-09-07: qty 2
  Filled 2012-09-07: qty 1

## 2012-09-07 MED ORDER — LOPERAMIDE HCL 2 MG PO CAPS
2.0000 mg | ORAL_CAPSULE | ORAL | Status: AC | PRN
  Administered 2012-09-08: 4 mg via ORAL

## 2012-09-07 MED ORDER — CHLORDIAZEPOXIDE HCL 25 MG PO CAPS: 25.0000 mg | ORAL_CAPSULE | Freq: Three times a day (TID) | ORAL | Status: DC

## 2012-09-07 MED ORDER — VITAMIN B-1 100 MG PO TABS
100.0000 mg | ORAL_TABLET | Freq: Every day | ORAL | Status: DC
  Administered 2012-09-08 – 2012-09-12 (×5): 100 mg via ORAL
  Filled 2012-09-07 (×7): qty 1

## 2012-09-07 MED ORDER — CHLORDIAZEPOXIDE HCL 25 MG PO CAPS
25.0000 mg | ORAL_CAPSULE | Freq: Once | ORAL | Status: AC
  Administered 2012-09-07: 25 mg via ORAL
  Filled 2012-09-07: qty 1

## 2012-09-07 MED ORDER — CHLORDIAZEPOXIDE HCL 25 MG PO CAPS: 25.0000 mg | ORAL_CAPSULE | Freq: Four times a day (QID) | ORAL | Status: DC

## 2012-09-07 MED ORDER — ACETAMINOPHEN 325 MG PO TABS
650.0000 mg | ORAL_TABLET | Freq: Four times a day (QID) | ORAL | Status: DC | PRN
  Administered 2012-09-08 – 2012-09-10 (×6): 650 mg via ORAL

## 2012-09-07 MED ORDER — CHLORDIAZEPOXIDE HCL 25 MG PO CAPS: 25.0000 mg | ORAL_CAPSULE | Freq: Every day | ORAL | Status: DC

## 2012-09-07 MED ORDER — CHLORDIAZEPOXIDE HCL 25 MG PO CAPS
50.0000 mg | ORAL_CAPSULE | Freq: Every day | ORAL | Status: DC
Start: 2012-09-10 — End: 2012-09-08

## 2012-09-07 MED ORDER — CHLORDIAZEPOXIDE HCL 25 MG PO CAPS: 50.0000 mg | ORAL_CAPSULE | ORAL | Status: DC

## 2012-09-07 MED ORDER — CHLORDIAZEPOXIDE HCL 25 MG PO CAPS: 25.0000 mg | ORAL_CAPSULE | Freq: Four times a day (QID) | ORAL | Status: DC | PRN

## 2012-09-07 MED ORDER — THIAMINE HCL 100 MG/ML IJ SOLN
100.0000 mg | Freq: Once | INTRAMUSCULAR | Status: AC
  Administered 2012-09-07: 100 mg via INTRAMUSCULAR

## 2012-09-07 MED ORDER — CHLORDIAZEPOXIDE HCL 25 MG PO CAPS: 25.0000 mg | ORAL_CAPSULE | ORAL | Status: DC

## 2012-09-07 MED ORDER — LORATADINE 10 MG PO TABS
10.0000 mg | ORAL_TABLET | Freq: Every day | ORAL | Status: DC
  Administered 2012-09-08: 10 mg via ORAL
  Filled 2012-09-07 (×9): qty 1

## 2012-09-07 MED ORDER — ADULT MULTIVITAMIN W/MINERALS CH: 1.0000 | ORAL_TABLET | Freq: Every day | ORAL | Status: DC

## 2012-09-07 MED ORDER — CHLORDIAZEPOXIDE HCL 25 MG PO CAPS
50.0000 mg | ORAL_CAPSULE | Freq: Four times a day (QID) | ORAL | Status: AC
  Administered 2012-09-07: 50 mg via ORAL
  Filled 2012-09-07 (×2): qty 2
  Filled 2012-09-07: qty 1

## 2012-09-07 MED ORDER — HYDROXYZINE HCL 25 MG PO TABS: 25.0000 mg | ORAL_TABLET | Freq: Four times a day (QID) | ORAL | Status: DC | PRN

## 2012-09-07 MED ORDER — MAGNESIUM HYDROXIDE 400 MG/5ML PO SUSP: 30.0000 mL | Freq: Every day | ORAL | Status: DC | PRN

## 2012-09-07 MED ORDER — OLANZAPINE 5 MG PO TBDP
5.0000 mg | ORAL_TABLET | Freq: Once | ORAL | Status: AC
  Administered 2012-09-07: 5 mg via ORAL
  Filled 2012-09-07 (×2): qty 1

## 2012-09-07 MED ORDER — TRAZODONE HCL 50 MG PO TABS
50.0000 mg | ORAL_TABLET | Freq: Every evening | ORAL | Status: DC | PRN
  Administered 2012-09-08: 50 mg via ORAL
  Filled 2012-09-07: qty 1

## 2012-09-07 MED ORDER — VITAMIN B-1 100 MG PO TABS: 100.0000 mg | ORAL_TABLET | Freq: Every day | ORAL | Status: DC

## 2012-09-07 MED ORDER — ONDANSETRON 4 MG PO TBDP: 4.0000 mg | ORAL_TABLET | Freq: Four times a day (QID) | ORAL | Status: DC | PRN

## 2012-09-07 NOTE — Progress Notes (Signed)
RN 1:1 Note: Patient stating that she can not contract for safety and wants to end her life "because I can't keeping going through this". Staff at bedside as 1:1. . PRN Hydroxizine and Librium given per Dr. Dallie Dad order. Staff present with patient. Affect tearful and depressed. Patient shows limited insight with child-like posture and affect at times. Joice Lofts RN MS EdS 09/07/2012  4:27 PM

## 2012-09-07 NOTE — Progress Notes (Addendum)
09/07/2012         Time: 1500      Group Topic/Focus: The focus of the group is on enhancing the patients' ability to cope with stressors by understanding what coping is, why it is important, the negative effects of stress and developing healthier coping skills. Patients asked to complete a fifteen minute plan, outlining three triggers, three supports, and fifteen coping activities.  Participation Level: Minimal  Participation Quality: Attentive  Affect: Blunted  Cognitive: Alert   Additional Comments: Patient late to group, reported she wasn't in the right frame of mind to complete the activity, but would during her personal time.   Natalie Mcdaniel 09/07/2012 3:50 PM
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
## 2012-09-07 NOTE — [facility] Counselor (Signed)
[facility] Group Notes:  (Counselor/Nursing/MHT/Case Management/Adjunct)    Type of Therapy:  Group Therapy 09/07/2012 1:15 to 2:30  Participation Level:  Did Not Attend  [name] 09/07/2012,

## 2012-09-07 NOTE — Progress Notes (Signed)
RN Admit Note: (D) Patient is a 46 year old female who has had two prior detox admissions on our unit. She completed CD-IOP in August and was taking Antabuse while participating in out-patient therapy. Last week patient attended a birthday party for a friend at a restaurant and decided to "see if the Antabuse worked". Patient did not get sick and continued to drink. She reports drinking 4-5 bottles of wine daily with last drink this AM (stated that she drank two bottles of wine this morning). Patient nauseated and verbalizing hopeless, helpless statements. She denies SI/HI at present. (A) Patient given PRN;s for nausea and detox symptoms. Encouraged patient to provide urine sample. (R) Patient in bed sleeping. Joice Lofts RN MS EdS 09/07/2012  2:41 PM
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
## 2012-09-07 NOTE — Tx Team (Addendum)
Initial Interdisciplinary Treatment Plan  PATIENT STRENGTHS: (choose at least two) Average or above average intelligence Capable of independent living Financial means General fund of knowledge Physical Health Supportive family/friends  PATIENT STRESSORS: Marital or family conflict Substance abuse   PROBLEM LIST: Problem List/Patient Goals Date to be addressed Date deferred Reason deferred Estimated date of resolution  ETOH Abuse 09/07/2012   D/C  Depression 09/07/2012    D/C                                             DISCHARGE CRITERIA:  Ability to meet basic life and health needs Improved stabilization in mood, thinking, and/or behavior Medical problems require only outpatient monitoring Need for constant or close observation no longer present Reduction of life-threatening or endangering symptoms to within safe limits  PRELIMINARY DISCHARGE PLAN: Attend aftercare/continuing care group Attend 12-step recovery group Outpatient therapy  PATIENT/FAMIILY INVOLVEMENT: This treatment plan has been presented to and reviewed with the patient, Natalie Mcdaniel.  The patient has been given the opportunity to ask questions and make suggestions.  [name] 09/07/2012, 12:51 PM
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
## 2012-09-08 LAB — URINALYSIS, ROUTINE W REFLEX MICROSCOPIC
Bilirubin Urine: NEGATIVE
Glucose, UA: NEGATIVE mg/dL
Ketones, ur: NEGATIVE mg/dL
Leukocytes, UA: NEGATIVE
Nitrite: NEGATIVE
Protein, ur: NEGATIVE mg/dL
Specific Gravity, Urine: 1.009 (ref 1.005–1.030)
Urobilinogen, UA: 1 mg/dL (ref 0.0–1.0)
pH: 6.5 (ref 5.0–8.0)

## 2012-09-08 LAB — TSH: TSH: 1.691 u[IU]/mL (ref 0.350–4.500)

## 2012-09-08 LAB — URINE MICROSCOPIC-ADD ON

## 2012-09-08 LAB — T3, FREE: T3, Free: 2.8 pg/mL (ref 2.3–4.2)

## 2012-09-08 LAB — T4, FREE: Free T4: 1.14 ng/dL (ref 0.80–1.80)

## 2012-09-08 MED ORDER — CHLORDIAZEPOXIDE HCL 25 MG PO CAPS
25.0000 mg | ORAL_CAPSULE | Freq: Two times a day (BID) | ORAL | Status: AC
  Administered 2012-09-09 (×2): 25 mg via ORAL
  Filled 2012-09-08 (×2): qty 1

## 2012-09-08 MED ORDER — CHLORDIAZEPOXIDE HCL 25 MG PO CAPS
25.0000 mg | ORAL_CAPSULE | Freq: Three times a day (TID) | ORAL | Status: AC
  Administered 2012-09-08: 25 mg via ORAL
  Filled 2012-09-08: qty 1

## 2012-09-08 MED ORDER — CHLORDIAZEPOXIDE HCL 25 MG PO CAPS
25.0000 mg | ORAL_CAPSULE | Freq: Every day | ORAL | Status: AC
  Administered 2012-09-10: 25 mg via ORAL
  Filled 2012-09-08 (×2): qty 1

## 2012-09-08 NOTE — Progress Notes (Signed)
Pt requesting lab to check iron level. Pt reports that her iron is usually low. She is having her period and she usually takes an iron pill at home. Sent note to MD.

## 2012-09-08 NOTE — Progress Notes (Signed)
Pt in room and reports that she ate dinner on the unit with her daughter. Pt reports that she had passive si earlier today. Offered support and 1: 1 observation. Safety maintained on unit.

## 2012-09-08 NOTE — Progress Notes (Signed)
Patient did not attend discharge planning group.  Patient seen by Clinical research associate in her room.  Patient appeared tired but was cooperative.  Patient discussed factors leading up to admission to [facility].  Patient explained that she has been dealing with ETHO dependency for thee years, patient had been drinking up to three bottles of wine daily.  She relapsed last Wednesday after three months of sobriety.  Patient communicated that she drank approx. ten to twelve bottles of wine during the six days leading to her admission.  Patients plan for recovery is to complete the detox process and continue attending AA meetings. She is open to follow-up outpatient services in [place] and is able to afford any necessary medications. Patient currently lives with her husband and three children. Patient has transportation to return home upon discharge. Patient requested contacts be made with her employer and their disability department.  Patient is not endorsing SI.  She communicated that upon admission she felt so bad that she "wanted to end that feeling" but no longer has that desire.  Patient rates her depression at 3, anxiety at 1, and helplessness at 6.

## 2012-09-08 NOTE — H&P (Signed)
Read and reviewed.  Please see SRA completed by this writer upon admission to unit.

## 2012-09-08 NOTE — Progress Notes (Signed)
Patient ID: Natalie Mcdaniel, female   DOB: 09-26-1966, 46 y.o.   MRN: [id number] D: Pt. Irritable, reports headache, pain at "6". A: Writer reviewed med regime and noted time of administration. Provided emotional support. Staff will continue to monitor 1:1 for safety. Pt. Encouraged to go to group. R: Pt is safe on the unit with 1:1. Pt. Went to group but did not stay the entire length.

## 2012-09-08 NOTE — Progress Notes (Signed)
Psychoeducational Group Note  Date:  09/08/2012 Time:  1100  Group Topic/Focus:  Personal Choices and Values:   The focus of this group is to help patients assess and explore the importance of values in their lives, how their values affect their decisions, how they express their values and what opposes their expression.  Participation Level: Did Not Attend  Participation Quality:  Not Applicable  Affect:  Not Applicable  Cognitive:  Not Applicable  Insight:  Not Applicable  Engagement in Group: Not Applicable  Additional Comments:  Pt did not attend group but remained resting in bed.   Sharyn Lull 09/08/2012, 11:48 AM

## 2012-09-08 NOTE — Progress Notes (Signed)
Nutrition Brief Note  Intervention: Encouraged pt to consume small frequent meals/snacks of bland foods to improve nutrition.   Patient identified on the Malnutrition Screening Tool (MST) report for unintended weight loss, generating a score of 2.   Body mass index is 30.34 kg/(m^2). Pt meets criteria for class I obesity based on current BMI.   - Pt reports barely eating anything for the past 6 days. Pt reports PTA she was eating light foods. Pt reports having some emesis yesterday however none today and reports her nausea is improving. Pt reports if she drinks water, her stomach hurts. Pt reports she thinks she may have lost recently, but she is unsure how much. Past record's show pt's weight is up 2 pounds in the past 3 months. Pt reported drinking 3 liters of wine/day for the past week. Pt getting multivitamin and thiamine.   Wt Readings from Last 10 Encounters:  09/07/12 188 lb (85.276 kg)  06/04/12 186 lb (84.369 kg)  04/21/12 184 lb (83.462 kg)  04/08/12 186 lb 8 oz (84.596 kg)  03/04/12 190 lb (86.183 kg)  02/11/12 188 lb 9.6 oz (85.548 kg)  02/05/12 188 lb (85.276 kg)   No nutrition interventions warranted at this time. If nutrition issues arise, please consult RD.   Levon Hedger MS, RD, LDN [phone] Pager [phone] After Hours Pager
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
## 2012-09-08 NOTE — Progress Notes (Signed)
Pt eating in her room. She reports that she is feeling more alert. Held 1200 librium per order. Reported to MD and he reduced protocol for librium. Supported pt to discuss feeling. Offered 1:1 for safety. Pt denies si and hi at this time. Safety maintained on unit.

## 2012-09-08 NOTE — Progress Notes (Signed)
Pt in bed asleep. Respirations even and unlabored. Offered support and 1:1 observations. Safety maintained on unit.

## 2012-09-08 NOTE — Progress Notes (Signed)
[facility] Group Notes:  (Counselor/Nursing/MHT/Case Management/Adjunct)  09/08/2012 2:30 PM  Type of Therapy: Group Therapy   Participation Level: Did Not attend group.  In room with 1:1    Natalie Mcdaniel 09/08/2012  2:30 PM

## 2012-09-08 NOTE — H&P (Signed)
Read and reviewed. 

## 2012-09-09 DIAGNOSIS — F1020 Alcohol dependence, uncomplicated: Secondary | ICD-10-CM

## 2012-09-09 MED ORDER — HYDROXYZINE HCL 50 MG PO TABS
50.0000 mg | ORAL_TABLET | Freq: Three times a day (TID) | ORAL | Status: DC | PRN
  Administered 2012-09-09 – 2012-09-11 (×3): 50 mg via ORAL
  Filled 2012-09-09: qty 1

## 2012-09-09 NOTE — Progress Notes (Signed)
09/09/2012         Time: 1500      Group Topic/Focus: The focus of this group is on enhancing the patient's understanding of leisure, barriers to leisure, and the importance of engaging in positive leisure activities upon discharge for improved total health.  Participation Level: Active  Participation Quality: Appropriate and Attentive  Affect: Appropriate  Cognitive: Oriented   Additional Comments: Patient able to identify leisure interests she can engage in upon discharge.    Natalie Mcdaniel 09/09/2012 3:33 PM
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
## 2012-09-09 NOTE — Progress Notes (Signed)
D:  Patient up and active in the milieu.  Has been attending and participating in groups.  Affect a bit brighter.   A:  Spoke with MD about possibly discontinuing the one to one.  She wants to wait one more day.  Relayed message to the patient.   R:  Continues on one to one for safety.  Patient is safe at this time.

## 2012-09-09 NOTE — Progress Notes (Signed)
Adult Psychosocial Assessment Update Interdisciplinary Team  [facility] admissions[facility]discharges:  Admissions Discharges  Date: 6/18/203 CDIOP Date:07/23/2012  CDIOP  Date:06/04/2012 Date 06/07/2012  Date: 04/21/2012 Date:04/25/2012  Date:04/14/2011 Date: 04/22/2011  Date: Date:   Changes since the last Psychosocial Assessment (including adherence to outpatient mental health and/or substance abuse treatment, situational issues contributing to decompensation and/or relapse). Pt completed Detox at [facility] on 06/07/2012.  She started CDIOP immediately following and completed that program on 07/23/2012.  She had been attending AA.  Pt had a glass of wine with dinner one week ago which started a relapse.  Pt was drinking 3 3-liter bottles of wine per day upon admission.  Pt is afraid she will lose her husband and her job.  Pt felt so bad about her continuing to relapse and this being her 3rd detox that she became suicidal and stated she would use anything she could to kill herself.  Pt was placed on 1:1.             Discharge Plan 1. Will you be returning to the same living situation after discharge?   Yes: yes No:      If no, what is your plan?           2. Would you like a referral for services when you are discharged? Yes:     If yes, for what services?  No:       Yes. Would like outpatient therapist.  Plans to return home to husband and resume her job.  Plans to attend AA on a regular basis.       Summary and Recommendations (to be completed by the evaluator) This is Pt's 3rd detox in 6 months.  She completed IOP, was sober 3 months.  She drank one glass of wine and that triggered her relapse.  She was drinking large quantities of wine daily and was suicidal upon admission.  Recommend:  Crisis Stabilization, Detox, psych eval, med mgt, group therapy, psych/edu groups, and case management.                        Signature:  Christen Butter, 09/09/2012 12:36 PM

## 2012-09-09 NOTE — Progress Notes (Signed)
1:1 entry for Natalie Mcdaniel. D: pt is behaving appropriately at this time. Earlier in the shift the pt became upset and attempted to barracade herself in her room. Writer was inside at the time with another pt, so the pt moved away from the door and allowed staff to enter. She was angry because she would not be allowed to leave the unit during the 1:1 observation. A: Writer explained to the pt what expectations we have in order to be taken off 1:1 status. Pt mood is irritable and childlike and her affect is agitated but she was redirectable. Pt behavior deescalated and she is cooperating with the sitter.  R: Her psychiatrist entered the room and offered to allow the pt attend meals and karaoke this evening. Pt was very pleased and has been cooperative ever since. Pt was apologetic for acting out and promises to better control her anger in the future. Pt currently has visitors in her room. Pt environment is safe and sitter is present. Writer will continue to monitor.

## 2012-09-09 NOTE — Progress Notes (Addendum)
LCSW CM and CM Intern met with client to discuss discharge planning needs.  Patient discussed factors which led up to admission in to [facility].  Patients affect is positive. Patient states that she tried CD- IOP in the past but her schedule will not presently accommodate the type of time commitment it requires.  Patient expressed that she is motivated to return to work and to her family.  She communicates that she is highly interested in follow up treatment with a therapist to discuss her substance abuse and depression.
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
## 2012-09-09 NOTE — Progress Notes (Signed)
[facility] Adult Inpatient Family/Significant Other Suicide Prevention Education  Suicide Prevention Education:  Contact Attempts: Natalie Mcdaniel [phone] Pt's husband, and Natalie Mcdaniel, daughter [phone]  has been identified by the patient as the family member/significant other with whom the patient will be residing, and identified as the person(s) who will aid the patient in the event of a mental health crisis.  With written consent from the patient, two attempts were made to provide suicide prevention education, prior to and/or following the patient's discharge.  We were unsuccessful in providing suicide prevention education.  A suicide education pamphlet was given to the patient to share with family/significant other.  Date and time of first attempt:  09/09/2012 3:55 PM  Call to husband,  No answer (message states: voice mail not set up yet)  Call to Daughter 09/09/2012  Message states that number is not in service.    Second time of second attempt   Spoke with husband and provided suicide prevention education on 08/23/2012 at 11:30 am.  He stated he will have his daughter Natalie Mcdaniel call or provide [place] with a valid phone number.  [facility] Adult Inpatient Family/Significant Other Suicide Prevention Education  Suicide Prevention Education:  Education Completed; Natalie Mcdaniel, husband, [phone] has been identified by the patient as the family member/significant other with whom the patient will be residing, and identified as the person(s) who will aid the patient in the event of a mental health crisis (suicidal ideations/suicide attempt).  With written consent from the patient, the family member/significant other has been provided the following suicide prevention education, prior to the and/or following the discharge of the patient.  The suicide prevention education provided includes the following:  Suicide risk factors  Suicide prevention and interventions  National Suicide Hotline telephone  number  [facility] assessment telephone number  [facility] Emergency Assistance 911  [facility] and/or Residential Mobile Crisis Unit telephone number  Request made of family/significant other to:  Remove weapons (e.g., guns, rifles, knives), all items previously/currently identified as safety concern.    Remove drugs/medications (over-the-counter, prescriptions, illicit drugs), all items previously/currently identified as a safety concern.  The family member/significant other verbalizes understanding of the suicide prevention education information provided.  The family member/significant other agrees to remove the items of safety concern listed above.  Marni Griffon C 09/10/2012, 12:22 PM   Marni Griffon C 09/09/2012, 3:56 PM

## 2012-09-10 DIAGNOSIS — F603 Borderline personality disorder: Secondary | ICD-10-CM | POA: Diagnosis present

## 2012-09-10 DIAGNOSIS — F1010 Alcohol abuse, uncomplicated: Secondary | ICD-10-CM

## 2012-09-10 DIAGNOSIS — F1910 Other psychoactive substance abuse, uncomplicated: Secondary | ICD-10-CM

## 2012-09-10 NOTE — Progress Notes (Signed)
Patient did attend the evening karaoke group. Patient participated by singing and dancing.

## 2012-09-10 NOTE — Progress Notes (Signed)
Pt has been up and has been active while in the milieu this evening, has been attending and participating in various activities, pt did receive visit from children this afternoon, pt has been making plans in regards to discharge, does state she is feeling better, pt has received all medications without incident, support and encouragement provided, will continue to monitor

## 2012-09-10 NOTE — Progress Notes (Signed)
[facility] In Patient Progress Note 09/10/2012 11:06 AM JAKIA NETTLETON 31-Aug-1966 [id number] Hospital day #:3 Diagnosis: Axis I: Alcohol Use & W/D Disorders; PTSD; r/o PD NOS with Cluster B Traits; Acute Suicidality   ADL's:  Intact Sleep:  Greatly improved Appetite:ok  Groups:Good  Subjective: Met with patient who reported the following.  She states she feels much better this morning.  She was contrite and apologetic about her poor behavior last night when she had a "melt down because I didn't get my way."  She notes that she feels much more in control this morning and does not feel that she needs the 1:1.  She did go to [place] last night and down to cafe for meal and this helped.   height is 5\' 6"  (1.676 m) and weight is 85.276 kg (188 lb). Her oral temperature is 97.3 F (36.3 C). Her blood pressure is 121/88 and her pulse is 91. Her respiration is 20.   Objective: Patient states she is much relieved as she spoke to her employer and she is not worried about her job now.  Patient reports this was the biggest stressor for her and she feels much relief over this.  She has a limited insight and does not grasp that her behavior was inappropriate for her age.  She states that she is spoiled and likes to get her way. Sx of withdrawal:None  Ros: ROS: Constitional: WDWN Adult in NAD COR: negative for SOB, CP, cough, wheezing GI: Negative for Nausea, vomiting, diarrhea, constipation, abdominal pain Neuro: negative for dizziness, blurred vision, headaches, numbness or tingling Ortho: negative for limb pain, swelling, change in ambulatory status.  Mental Status Exam Level of Consciousness: awake  Orientation: x 3 General Appearance : groomed Behavior:  cooperative Eye Contact:  good Motor Behavior:  normal Speech:  Immature and whiney, almost baby talk. Mood:  Patient reports no depression today  Suicidal Ideation: No suicidal ideation, no plan, no intent, no means. Homicidal Ideation:  No  homicidal ideation, no plan, no intent, no means.  Affect:  Congruent, bright and pleasant Anxiety Level:  none Thought Process:  linear Thought Content:  normal Perception:  intact Judgment:  poor Insight:  limited Cognition:  At least average Sleep:  Number of Hours: 5.5     Lab Results: No results found for this or any previous visit (from the past 48 hour(s)). Labs are reviewed. Physical Findings: AIMS: CIWA:  CIWA-Ar Total: 0  COWS:    Medication:  . chlordiazePOXIDE  25 mg Oral BID   Followed by  . chlordiazePOXIDE  25 mg Oral Daily  . loratadine  10 mg Oral Daily  . multivitamin with minerals  1 tablet Oral Daily  . sertraline  50 mg Oral Daily  . thiamine  100 mg Oral Daily    Treatment Plan Summary: 1. Admit for crisis management and stabilization. 2. Medication management to reduce current symptoms to base line and improve the patient's overall level of functioning 3. Treat health problems as indicated. 4. Develop treatment plan to decrease risk of relapse upon discharge and the need for readmission. 5. Psycho-social education regarding relapse prevention and self care. 6. Health care follow up as needed for medical problems. 7. Restart home medications where appropriate.   Plan: 1. Reviewed the patient's medication with her. She felt the vistaril has helped and she would like to stay on this. 2. Does not want to take her librium and refused it this morning. She is aware that this is  her last dose, and she is free to refuse this if she does not want it. 3. Will cancel the 1:1 as the patient does not meet the criteria for 1;1 at this time.  She does acknowledge that the behaviors demonstrated last night were unacceptable and that should they reoccur the 1:1 will be restarted.  4. She will work with the CM regarding her follow up upon discharge.  She states she does not want to do CDIOP. 5. She is covered through Sunday and no plans for discharge are made for today  due to her 1:1 status for the last 24 hours.   6. Patient is aware that D/C status will be assessed on a daily basis and readiness for D/C is based on her behavior and medical stability. Rona Ravens. Brooks Kinnan PAC 09/10/2012, 11:06 AM

## 2012-09-10 NOTE — Progress Notes (Signed)
Psychoeducational Group Note  Date:  09/10/2012 Time:  1100  Group Topic/Focus:  Relapse Prevention Planning:   The focus of this group is to define relapse and discuss the need for planning to combat relapse.  Participation Level:  Active  Participation Quality:  Appropriate, Redirectable, Sharing and Supportive  Affect:  Appropriate and Flat  Cognitive:  Alert, Appropriate and Oriented  Insight:  Good  Engagement in Group:  Good  Additional Comments:  Pt attended and participated in group discussing relapse prevention planning. Pt identified her reason for changing her behavior as being "wanting to be a normal person and functioning in society."   Dalia Heading 09/10/2012, 12:34 PM

## 2012-09-10 NOTE — Progress Notes (Signed)
Patient ID: Natalie Mcdaniel, female   DOB: 1966/11/17, 45 y.o.   MRN: [id number] D: Pt. In dayroom visiting with daughters. Pt. Pleasant, reports "feeling better". Pt. Notes sleeping med "zonked her out last night, don't want no more of that." A: Writer told pt. meds would be reviewed.  Writer reviewed new order for pt. To go to karaoke even though she was on 1:1. Staff will also monitor for safety q69min. R: Pt. Did attend karaoke "I enjoyed it." Pt. Is safe on the unit, accompanied by sitter.

## 2012-09-10 NOTE — Progress Notes (Signed)
Psychoeducational Group Note  Date:  09/10/2012 Time:  1000  Group Topic/Focus:  Relapse Prevention Planning:   The focus of this group is to define relapse and discuss the need for planning to combat relapse. Question Ander Slade Activity  Participation Level:  Active  Participation Quality:  Appropriate, Attentive, Sharing and Supportive  Affect:  Appropriate  Cognitive:  Appropriate  Insight:  Good  Engagement in Group:  Good  Additional Comments:  Pt participated in therapeutic activity of the question ball. Question ball game consists of asking question that are therapeutic and helpful coping skills, hobbies, and explanation of support system of people that positive in pt life. Pt stated gardening was one of her hobbies, and daughters are important people to her.  Karleen Hampshire Brittini 09/10/2012, 10:44 AM

## 2012-09-10 NOTE — Progress Notes (Signed)
[facility] Group Notes:  (Counselor/Nursing/MHT/Case Management/Adjunct)  09/10/2012  2:30  PM  Type of Therapy: Group Therapy   Participation Level: Minimal   Participation Quality:  Active  Affect: Depressed  Cognitive: oriented, alert   Insight: Poor  Engagement in Group: Limited  Modes of Intervention: Clarification, Education, Problem-solving, Socialization, Activity, Encouragement and Support   Summary of Progress/Problems: Pt participated in group by listening and self disclosing.  After a brief check-in, therapist introduced the topic of Feelings Around Relapse. Therapist asked Patients to identify:  Which emotions come before and after relapse; 2.  Which emotions are the most powerful.  Pt stated loneliness.  Pt addressed her codependency  Issues.  She was open to reading a book on codependency which was recommended.  Pt gave a good example of the benefits of asking for help.  Therapist emphasized the importance of and benefit to recovery of learning to ask for help vs trying to maintain a false sense of control.  Therapist prompted patients to practice asking another Pt a question.  3. Which emotions they have related to recovery. Therapist prompted patients to identify their recovery plans and any barriers they perceive to their efforts.  Therapist offered support and encouragement.  Some progress noted.  Intervention effective.         Marni Griffon C 09/10/2012  2:30 PM

## 2012-09-10 NOTE — Progress Notes (Signed)
Patient seen during d/c planning group.  She reports being much better today. She is currently denying SI/HI and is hopeful to discharge over the weekend.

## 2012-09-10 NOTE — Progress Notes (Signed)
Writer and MSW Intern met with patient to assist in contacting employer for disability forms.  Forms obtained and were given to patient to have MD complete prior to discharge.

## 2012-09-10 NOTE — Progress Notes (Addendum)
D patient selpt well last nite and her appetite is good, eating meals in the DR, energy level is high and ability to pay attention is good, depressed 1/10 and hopeless 0/10, has been experiencing some diarrhea, denies SI and HI, c/o very slight headache, attending group A q61min safeety checks continue and support offered, prn given for headache with relieef, 1:1 discontinued at 1033 this morning and has been doing fine sine that time, states she feels the best she has felt in weeks, pleasant and cooperative, attending group R patient remains safe on the hall  1430 c/o nausea, given gingerale to drink slowly

## 2012-09-11 MED ORDER — SERTRALINE HCL 50 MG PO TABS: 50.0000 mg | ORAL_TABLET | Freq: Every day | ORAL | Status: DC

## 2012-09-11 NOTE — Progress Notes (Signed)
  Natalie Mcdaniel is a 46 y.o. female [id number] 18-Oct-1966  09/07/2012 Principal Problem:  *Alcohol abuse Active Problems:  Borderline personality disorder   Mental Status: Alert mood is back to baseline. Denies SI/HI/AVH.No withdrawal symptoms at this time.     Subjective/Objective: Works from home wants to be able to go back to work Monday. Has follow up scheduled with [employer] and a sponsor for [employer].   Filed Vitals:   09/11/12 0701  BP: 130/87  Pulse: 91  Temp:   Resp:     Lab Results:   BMET    Component Value Date/Time   NA 139 09/07/2012 2000   K 4.0 09/07/2012 2000   CL 100 09/07/2012 2000   CO2 29 09/07/2012 2000   GLUCOSE 83 09/07/2012 2000   BUN 6 09/07/2012 2000   CREATININE 0.88 09/07/2012 2000   CREATININE 0.82 02/05/2012 1500   CALCIUM 9.1 09/07/2012 2000   GFRNONAA 77* 09/07/2012 2000   GFRAA 89* 09/07/2012 2000    Medications:  Scheduled:     . chlordiazePOXIDE  25 mg Oral Daily  . loratadine  10 mg Oral Daily  . multivitamin with minerals  1 tablet Oral Daily  . sertraline  50 mg Oral Daily  . thiamine  100 mg Oral Daily     PRN Meds acetaminophen, alum & mag hydroxide-simeth, chlordiazePOXIDE, hydrOXYzine, loperamide, magnesium hydroxide, ondansetron  Plan: can do Suicide Risk assessment this afternoon and plan for discharge Sunday morning.   Saber Dickerman,MICKIE D. 09/11/2012

## 2012-09-11 NOTE — Progress Notes (Signed)
Patient ID: Natalie Mcdaniel, female   DOB: May 08, 1966, 46 y.o.   MRN: [id number]   [facility] Group Notes:  (Counselor/Nursing/MHT/Case Management/Adjunct)  09/11/2012 1:15 PM  Type of Therapy:  Group Therapy, Dance/Movement Therapy   Participation Level:  Active  Participation Quality:  Appropriate  Affect:  Appropriate  Cognitive:  Confused  Insight:  Limited  Engagement in Group:  Good  Engagement in Therapy:  Good  Modes of Intervention:  Clarification, Problem-solving, Role-play, Socialization and Support  Summary of Progress/Problems: Therapist and group members discussed self-sabotaging behaviors. Group members shared negative thoughts and justifications from their addiction, such as "just have one more drink, you can handle it." Group members then wrote down one of these justifications and ripped it up. Pt shared that she calls these justifications, "stinking thinking." Pt seemed very confused when other group members were sharing and stated, "oh...I don't know anything about drugs, I'm just an alcoholic." Suggesting that she was trying to down play her problem.  [name] 09/11/2012. 2:25 PM

## 2012-09-11 NOTE — Progress Notes (Signed)
Patient ID: Natalie Mcdaniel, female   DOB: 10-20-1966, 46 y.o.   MRN: [id number]  Pt. attended and participated in aftercare planning group. Pt. verbally accepted information on suicide prevention, warning signs to look for with suicide and crisis line numbers to use. Pt. listed their current anxiety level as 0 and their current depression level as 0. Pt stated she is "happy and on cloud 9." Pt shared that she is ready to go home.

## 2012-09-11 NOTE — Progress Notes (Signed)
Patient ID: Natalie Mcdaniel, female   DOB: [birth year], 46 y.o.   MRN: [id number] Pt resting in bed talking with room mate.  No distress noted.  Needs assessed.  Pt denies. Support given.  Will continue to monitor.

## 2012-09-11 NOTE — Progress Notes (Signed)
Patient did attend the evening speaker AA meeting.  

## 2012-09-11 NOTE — Progress Notes (Signed)
D-Patient quiet but appropriate. Attended group late with no participation. A-Rates depression at 1 and hopelessness at 0.  Denies withdrawal symptoms.  Denies SI. D/C plans are 90 in 90.church and new "people,places and things.' R-Support and encouragement given. Continue current POC and evaluation of treatment goals. Continue 15' checks for safety.

## 2012-09-11 NOTE — Progress Notes (Signed)
Psychoeducational Group Note  Date:  09/11/2012 Time:  0315  Group Topic/Focus:  Healthy Communication:   The focus of this group is to discuss communication, barriers to communication, as well as healthy ways to communicate with others.  Participation Level:  Active  Participation Quality:  Appropriate, Attentive, Sharing and Supportive  Affect:  Appropriate, Blunted and Excited  Cognitive:  Alert, Appropriate and Oriented  Insight:  Good  Engagement in Group:  Good  Additional Comments:  Pt attended and participated in group discussing health communication. Group discussion involved identifying unhealthy and healthy ways in which we communicate. Pt identified with examples of both unhealthy and healthy ways of communicating.   Dalia Heading 09/11/2012, 6:53 PM

## 2012-09-12 NOTE — Progress Notes (Signed)
Patient ID: Natalie Mcdaniel, female   DOB: 1966-09-16, 46 y.o.   MRN: [id number]  Pt. attended and participated in aftercare planning group. Pt. verbally accepted information on suicide prevention, warning signs to look for with suicide and crisis line numbers to use. The pt. agreed to call crisis line numbers if having warning signs or having thoughts of suicide. Pt. received the daily workbook and [employer].

## 2012-09-12 NOTE — Progress Notes (Signed)
Patient ID: Natalie Mcdaniel, female   DOB: 07/15/1966, 46 y.o.   MRN: [id number]   [facility] Group Notes:  (Counselor/Nursing/MHT/Case Management/Adjunct)  09/12/2012 1:15 PM  Type of Therapy:  Group Therapy, Dance/Movement Therapy   Participation Level:  Did Not Attend Pt was discharged earlier today.      [name] 09/12/2012. 2:22 PM
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
## 2012-09-12 NOTE — Progress Notes (Signed)
[facility] Case Management Discharge Plan:  Will you be returning to the same living situation after discharge: Yes,   At discharge, do you have transportation home?:Yes,   Do you have the ability to pay for your medications:Yes,    Interagency Information:     Release of information consent forms completed and in the chart;  Patient's signature needed at discharge.  Patient to Follow up at:  Follow-up Information    Follow up with [facility]. (You are scheduled with Michaelle Copas on Thursday, September 16, 2012 at 2:30 PM.  Please arrive 15 minutes early for your appointment.)    Contact information:   [address]  [place], [place]  [address]    [phone]         Patient denies SI/HI:   Yes,      Safety Planning and Suicide Prevention discussed:  Yes,    Barrier to discharge identified:No.  Summary and Recommendations: Pt was cleared for D/C and denies any and all S/I and H/I.   Gevena Mart 09/12/2012, 9:30 AM

## 2012-09-12 NOTE — Progress Notes (Signed)
Patient ID: Natalie Mcdaniel, female   DOB: April 07, 1966, 46 y.o.   MRN: [id number] Has been bright and pleasant this evening, interacting well with peers and staff, attended group.  Came to med window afterward to request something for sleep, stated she ws probably going to be discharged tomorrow and was doing a lot of thinking.  Was given vistaril, and she went to her room where she did some reading and talking to roommate for awhile.  No c/o's discomfort voiced, denies thoughts of self harm.  Will continue to monitor for safety.
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
## 2012-09-12 NOTE — Progress Notes (Signed)
Patient ID: Natalie Mcdaniel, female   DOB: Jan 29, 1966, 46 y.o.   MRN: [id number] nsg D/C note- Patient is bright and cooperative during d/c process. All f/u information and medication education given. Patient verbalized understanding.  Denies SI/HI/ and s/s of substance withdrawal. States all treatment goals were met. Will be attending AA meeting immediately following d/c.  All belongings returned and escorted to lobby to care of family.

## 2012-09-13 NOTE — Progress Notes (Signed)
Patient Discharge Instructions:  After Visit Summary (AVS):   Faxed to:  09/13/2012 Psychiatric Admission Assessment Note:   Faxed to:  09/13/2012 Suicide Risk Assessment - Discharge Assessment:   Faxed to:  09/13/2012 Faxed/Sent to the Next Level Care provider:  09/13/2012  Faxed to [facility] @ [phone]  Wandra Scot, 09/13/2012, 1:54 PM

## 2012-11-25 ENCOUNTER — Other Ambulatory Visit: Payer: Self-pay | Admitting: Internal Medicine

## 2012-11-25 DIAGNOSIS — Z1231 Encounter for screening mammogram for malignant neoplasm of breast: Secondary | ICD-10-CM

## 2012-12-30 ENCOUNTER — Ambulatory Visit
Admission: RE | Admit: 2012-12-30 | Discharge: 2012-12-30 | Disposition: A | Discharge status: Home / Self Care | Payer: Managed Care, Other (non HMO) | Source: Ambulatory Visit | Attending: Internal Medicine | Admitting: Internal Medicine

## 2012-12-30 DIAGNOSIS — Z1231 Encounter for screening mammogram for malignant neoplasm of breast: Secondary | ICD-10-CM

## 2013-01-06 ENCOUNTER — Encounter (HOSPITAL_COMMUNITY): Payer: Self-pay | Admitting: Family Medicine

## 2013-01-06 ENCOUNTER — Emergency Department (HOSPITAL_COMMUNITY)
Admission: EM | Admit: 2013-01-06 | Discharge: 2013-01-07 | Disposition: A | Discharge status: Home / Self Care | Payer: Managed Care, Other (non HMO) | Attending: Emergency Medicine | Admitting: Emergency Medicine

## 2013-01-06 DIAGNOSIS — F411 Generalized anxiety disorder: Secondary | ICD-10-CM | POA: Insufficient documentation

## 2013-01-06 DIAGNOSIS — F3289 Other specified depressive episodes: Secondary | ICD-10-CM | POA: Insufficient documentation

## 2013-01-06 DIAGNOSIS — F10929 Alcohol use, unspecified with intoxication, unspecified: Secondary | ICD-10-CM

## 2013-01-06 DIAGNOSIS — E669 Obesity, unspecified: Secondary | ICD-10-CM | POA: Insufficient documentation

## 2013-01-06 DIAGNOSIS — Z9884 Bariatric surgery status: Secondary | ICD-10-CM | POA: Insufficient documentation

## 2013-01-06 DIAGNOSIS — F1010 Alcohol abuse, uncomplicated: Secondary | ICD-10-CM | POA: Insufficient documentation

## 2013-01-06 DIAGNOSIS — F329 Major depressive disorder, single episode, unspecified: Secondary | ICD-10-CM | POA: Insufficient documentation

## 2013-01-06 DIAGNOSIS — IMO0002 Reserved for concepts with insufficient information to code with codable children: Secondary | ICD-10-CM | POA: Insufficient documentation

## 2013-01-06 LAB — COMPREHENSIVE METABOLIC PANEL
Alkaline Phosphatase: 100 U/L (ref 39–117)
BUN: 9 mg/dL (ref 6–23)
CO2: 23 mEq/L (ref 19–32)
Chloride: 96 mEq/L (ref 96–112)
GFR calc Af Amer: >90 mL/min (ref >90)
GFR calc non Af Amer: >90 mL/min (ref >90)
Glucose, Bld: 89 mg/dL (ref 70–99)
Potassium: 3.8 mEq/L (ref 3.5–5.1)
Total Bilirubin: 0.3 mg/dL (ref 0.3–1.2)

## 2013-01-06 LAB — ETHANOL: Alcohol, Ethyl (B): 303 mg/dL — ABNORMAL HIGH (ref 0–11)

## 2013-01-06 LAB — CBC
HCT: 37.6 % (ref 36.0–46.0)
Hemoglobin: 13 g/dL (ref 12.0–15.0)
MCHC: 34.6 g/dL (ref 30.0–36.0)
RBC: 4.35 MIL/uL (ref 3.87–5.11)

## 2013-01-06 LAB — RAPID URINE DRUG SCREEN, HOSP PERFORMED
Amphetamines: NOT DETECTED
Barbiturates: NOT DETECTED
Tetrahydrocannabinol: NOT DETECTED

## 2013-01-06 MED ORDER — LORAZEPAM 1 MG PO TABS: 0.0000 mg | ORAL_TABLET | Freq: Four times a day (QID) | ORAL | Status: DC

## 2013-01-06 MED ORDER — NICOTINE 21 MG/24HR TD PT24: 21.0000 mg | MEDICATED_PATCH | Freq: Every day | TRANSDERMAL | Status: DC

## 2013-01-06 MED ORDER — LORAZEPAM 1 MG PO TABS
1.0000 mg | ORAL_TABLET | Freq: Four times a day (QID) | ORAL | Status: DC | PRN
  Administered 2013-01-07: 1 mg via ORAL
  Filled 2013-01-06: qty 1

## 2013-01-06 MED ORDER — IBUPROFEN 600 MG PO TABS: 600.0000 mg | ORAL_TABLET | Freq: Three times a day (TID) | ORAL | Status: DC | PRN

## 2013-01-06 MED ORDER — LORAZEPAM 2 MG/ML IJ SOLN: 1.0000 mg | Freq: Four times a day (QID) | INTRAMUSCULAR | Status: DC | PRN

## 2013-01-06 MED ORDER — THIAMINE HCL 100 MG/ML IJ SOLN: 100.0000 mg | Freq: Every day | INTRAMUSCULAR | Status: DC

## 2013-01-06 MED ORDER — VITAMIN B-1 100 MG PO TABS
100.0000 mg | ORAL_TABLET | Freq: Every day | ORAL | Status: DC
  Administered 2013-01-06: 100 mg via ORAL
  Filled 2013-01-06: qty 1

## 2013-01-06 MED ORDER — ACETAMINOPHEN 325 MG PO TABS: 650.0000 mg | ORAL_TABLET | ORAL | Status: DC | PRN

## 2013-01-06 MED ORDER — ONDANSETRON HCL 4 MG PO TABS: 4.0000 mg | ORAL_TABLET | Freq: Three times a day (TID) | ORAL | Status: DC | PRN

## 2013-01-06 MED ORDER — ALUM & MAG HYDROXIDE-SIMETH 200-200-20 MG/5ML PO SUSP: 30.0000 mL | ORAL | Status: DC | PRN

## 2013-01-06 MED ORDER — ZIPRASIDONE MESYLATE 20 MG IM SOLR
20.0000 mg | Freq: Once | INTRAMUSCULAR | Status: AC
  Administered 2013-01-06: 20 mg via INTRAMUSCULAR
  Filled 2013-01-06: qty 20

## 2013-01-06 MED ORDER — ADULT MULTIVITAMIN W/MINERALS CH
1.0000 | ORAL_TABLET | Freq: Every day | ORAL | Status: DC
  Administered 2013-01-06: 1 via ORAL
  Filled 2013-01-06: qty 1

## 2013-01-06 MED ORDER — LORAZEPAM 1 MG PO TABS: 0.0000 mg | ORAL_TABLET | Freq: Two times a day (BID) | ORAL | Status: DC

## 2013-01-06 MED ORDER — FOLIC ACID 1 MG PO TABS
1.0000 mg | ORAL_TABLET | Freq: Every day | ORAL | Status: DC
  Administered 2013-01-06: 1 mg via ORAL
  Filled 2013-01-06: qty 1

## 2013-01-06 MED ORDER — ZOLPIDEM TARTRATE 5 MG PO TABS: 5.0000 mg | ORAL_TABLET | Freq: Every evening | ORAL | Status: DC | PRN

## 2013-01-06 NOTE — ED Notes (Signed)
Patient stating "I want to hurt something. I'm going to freak out." Spitting up in trash can. Continues to scream out I can't take it.
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
## 2013-01-06 NOTE — ED Notes (Signed)
Dammen, PA at bedside. Patient stating "I don't want to be here. I want to go home."

## 2013-01-06 NOTE — ED Notes (Signed)
One bag in locker 42

## 2013-01-06 NOTE — ED Notes (Signed)
Patient is resting comfortably. 

## 2013-01-06 NOTE — ED Notes (Signed)
Per police, mobile crisis unit called [employer] because patient has been drinking all day and was verbalizing that she wanted to kill herself. Patient accompanied by [employer]. Currently awaiting IVC paperwork to be initiated.

## 2013-01-06 NOTE — ED Notes (Signed)
Patient released from restraints. No signs of distress or injury at this time

## 2013-01-06 NOTE — ED Notes (Signed)
Patient was served with IVC papers by [facility]

## 2013-01-07 NOTE — ED Notes (Signed)
Pt discharged home with friend. VSS. Instructions reviewed. Pt verbalized understanding.

## 2013-01-07 NOTE — ED Notes (Signed)
Called pt's husband and daughter and informed them that pt was ready for d/c and pt needed a ride home. They stated they would come and pick up pt.

## 2013-01-08 NOTE — ED Provider Notes (Signed)
Medical screening examination/treatment/procedure(s) were performed by non-physician practitioner and as supervising physician I was immediately available for consultation/collaboration.    Nelia Shi, MD 01/08/13 [phone]

## 2013-01-10 ENCOUNTER — Telehealth: Payer: Self-pay | Admitting: Physician Assistant

## 2013-01-10 NOTE — Telephone Encounter (Signed)
I spoke w/pt and expressed our concern and Chelle's advice for f/up. Pt stated she is doing OK but does think she needs to RTC to see Chelle. Pt stated she will CB to make an appt. I encouraged pt to come to walk in clinic if she can not get an appt soon. Also encouraged pt to f/up w/therapist and AA QD. Pt voiced understanding.

## 2013-01-10 NOTE — Telephone Encounter (Signed)
Please call this patient.  I saw that she was recently in the ED.  Please encourage her to schedule follow-up here, or elsewhere, and with her therapist.  Encourage her to attend AA, at least 1 meeting every single day.

## 2013-01-10 NOTE — Telephone Encounter (Signed)
Chelle, I'm forwarding this back to you FYI.

## 2013-01-13 ENCOUNTER — Ambulatory Visit: Payer: Managed Care, Other (non HMO) | Admitting: Family Medicine

## 2013-01-13 VITALS — BP 109/71 | HR 69 | Temp 98.6°F | Resp 16 | Ht 67.0 in | Wt 185.0 lb

## 2013-01-13 DIAGNOSIS — F1020 Alcohol dependence, uncomplicated: Secondary | ICD-10-CM

## 2013-01-13 MED ORDER — NALTREXONE HCL 50 MG PO TABS: 50.0000 mg | ORAL_TABLET | Freq: Every day | ORAL | Status: DC
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
## 2013-01-13 NOTE — Patient Instructions (Addendum)
Good luck with your continued treatment.  Let us know if we can help in any way.  Let's plan to see you in about a month for a recheck, sooner if needed.

## 2013-01-20 ENCOUNTER — Telehealth: Payer: Self-pay | Admitting: Family Medicine

## 2013-01-20 NOTE — Telephone Encounter (Signed)
Have called her counseling office a couple of times and left messages for Saul Fordyce, NP.  So far have not been able to get in touch with her

## 2013-02-10 ENCOUNTER — Ambulatory Visit (INDEPENDENT_AMBULATORY_CARE_PROVIDER_SITE_OTHER): Payer: Managed Care, Other (non HMO) | Admitting: Physician Assistant

## 2013-02-10 VITALS — BP 121/79 | HR 105 | Temp 98.3°F | Resp 16 | Ht 66.0 in | Wt 180.0 lb

## 2013-02-10 DIAGNOSIS — F1020 Alcohol dependence, uncomplicated: Secondary | ICD-10-CM

## 2013-02-10 DIAGNOSIS — F1010 Alcohol abuse, uncomplicated: Secondary | ICD-10-CM

## 2013-02-10 MED ORDER — NALTREXONE HCL 50 MG PO TABS: 50.0000 mg | ORAL_TABLET | Freq: Every day | ORAL | Status: DC

## 2013-02-10 NOTE — Patient Instructions (Addendum)
Go to AA every day. Take the Vivitrol EVERY DAY. Find someone who will "get on you" every day to go to a meeting, take the medication, get you OUT of your room when you lock yourself in. Take EACH DAY one at a time.  Return to see me NEXT Thursday 2/27 at 4:15 at the appointment center.  I will see you as an add-on at the end of my clinic.

## 2013-02-12 ENCOUNTER — Encounter: Payer: Self-pay | Admitting: Physician Assistant

## 2013-02-12 NOTE — Assessment & Plan Note (Signed)
Restart naltrexone daily.  Continue AA meetings at least daily.  Make a plan for what to do when she feels she can't control the urge to drink.  Will likely need to involve someone willing to engage her, and she isn't sure there is anyone to do this.
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
## 2013-02-14 ENCOUNTER — Telehealth: Payer: Self-pay

## 2013-02-14 NOTE — Telephone Encounter (Signed)
Patient would like to know if Chelle has received her paperwork from [employer] for disability. Can someone please check Chelle's box and call patient back. Nothing on Maudia's desk.  Best [phone]

## 2013-02-15 NOTE — Telephone Encounter (Signed)
Checked in the providers box and could not find forms, maybe might be processed at clinical TL's desk? Just check our faxes again, and none for this patient.

## 2013-02-16 NOTE — Telephone Encounter (Signed)
Pt advised to bring in her forms and deliver to [facility].

## 2013-04-05 ENCOUNTER — Ambulatory Visit: Payer: Managed Care, Other (non HMO) | Admitting: Nurse Practitioner

## 2013-04-05 ENCOUNTER — Encounter: Payer: Self-pay | Admitting: Nurse Practitioner

## 2013-04-05 DIAGNOSIS — Z01419 Encounter for gynecological examination (general) (routine) without abnormal findings: Secondary | ICD-10-CM

## 2013-05-20 ENCOUNTER — Encounter: Payer: Self-pay | Admitting: *Deleted

## 2013-05-23 ENCOUNTER — Encounter: Payer: Self-pay | Admitting: Nurse Practitioner

## 2013-05-23 ENCOUNTER — Ambulatory Visit (INDEPENDENT_AMBULATORY_CARE_PROVIDER_SITE_OTHER): Payer: Managed Care, Other (non HMO) | Admitting: Nurse Practitioner

## 2013-05-23 VITALS — BP 104/60 | HR 74 | Ht 66.0 in | Wt 177.6 lb

## 2013-05-23 DIAGNOSIS — B9689 Other specified bacterial agents as the cause of diseases classified elsewhere: Secondary | ICD-10-CM

## 2013-05-23 DIAGNOSIS — N760 Acute vaginitis: Secondary | ICD-10-CM

## 2013-05-23 DIAGNOSIS — A499 Bacterial infection, unspecified: Secondary | ICD-10-CM

## 2013-05-23 DIAGNOSIS — Z113 Encounter for screening for infections with a predominantly sexual mode of transmission: Secondary | ICD-10-CM

## 2013-05-23 DIAGNOSIS — N912 Amenorrhea, unspecified: Secondary | ICD-10-CM

## 2013-05-23 MED ORDER — METRONIDAZOLE 0.75 % VA GEL: 1.0000 | Freq: Every day | VAGINAL | Status: DC

## 2013-05-23 MED ORDER — MEDROXYPROGESTERONE ACETATE 10 MG PO TABS: 10.0000 mg | ORAL_TABLET | Freq: Every day | ORAL | Status: DC

## 2013-05-23 MED ORDER — FLUCONAZOLE 150 MG PO TABS: 150.0000 mg | ORAL_TABLET | Freq: Once | ORAL | Status: DC

## 2013-05-23 NOTE — Patient Instructions (Addendum)
Bacterial Vaginosis Bacterial vaginosis (BV) is a vaginal infection where the normal balance of bacteria in the vagina is disrupted. The normal balance is then replaced by an overgrowth of certain bacteria. There are several different kinds of bacteria that can cause BV. BV is the most common vaginal infection in women of childbearing age. CAUSES   The cause of BV is not fully understood. BV develops when there is an increase or imbalance of harmful bacteria.  Some activities or behaviors can upset the normal balance of bacteria in the vagina and put women at increased risk including:  Having a new sex partner or multiple sex partners.  Douching.  Using an intrauterine device (IUD) for contraception.  It is not clear what role sexual activity plays in the development of BV. However, women that have never had sexual intercourse are rarely infected with BV. Women do not get BV from toilet seats, bedding, swimming pools or from touching objects around them.  SYMPTOMS   Grey vaginal discharge.  A fish-like odor with discharge, especially after sexual intercourse.  Itching or burning of the vagina and vulva.  Burning or pain with urination.  Some women have no signs or symptoms at all. DIAGNOSIS  Your caregiver must examine the vagina for signs of BV. Your caregiver will perform lab tests and look at the sample of vaginal fluid through a microscope. They will look for bacteria and abnormal cells (clue cells), a pH test higher than 4.5, and a positive amine test all associated with BV.  RISKS AND COMPLICATIONS   Pelvic inflammatory disease (PID).  Infections following gynecology surgery.  Developing HIV.  Developing herpes virus. TREATMENT  Sometimes BV will clear up without treatment. However, all women with symptoms of BV should be treated to avoid complications, especially if gynecology surgery is planned. Female partners generally do not need to be treated. However, BV may spread  between female sex partners so treatment is helpful in preventing a recurrence of BV.   BV may be treated with antibiotics. The antibiotics come in either pill or vaginal cream forms. Either can be used with nonpregnant or pregnant women, but the recommended dosages differ. These antibiotics are not harmful to the baby.  BV can recur after treatment. If this happens, a second round of antibiotics will often be prescribed.  Treatment is important for pregnant women. If not treated, BV can cause a premature delivery, especially for a pregnant woman who had a premature birth in the past. All pregnant women who have symptoms of BV should be checked and treated.  For chronic reoccurrence of BV, treatment with a type of prescribed gel vaginally twice a week is helpful. HOME CARE INSTRUCTIONS   Finish all medication as directed by your caregiver.  Do not have sex until treatment is completed.  Tell your sexual partner that you have a vaginal infection. They should see their caregiver and be treated if they have problems, such as a mild rash or itching.  Practice safe sex. Use condoms. Only have 1 sex partner. PREVENTION  Basic prevention steps can help reduce the risk of upsetting the natural balance of bacteria in the vagina and developing BV:  Do not have sexual intercourse (be abstinent).  Do not douche.  Use all of the medicine prescribed for treatment of BV, even if the signs and symptoms go away.  Tell your sex partner if you have BV. That way, they can be treated, if needed, to prevent reoccurrence. SEEK MEDICAL CARE IF:     Your symptoms are not improving after 3 days of treatment.  You have increased discharge, pain, or fever. MAKE SURE YOU:   Understand these instructions.  Will watch your condition.  Will get help right away if you are not doing well or get worse. FOR MORE INFORMATION  Division of STD Prevention (DSTDP), Centers for Disease Control and Prevention:  SolutionApps.co.za American Social Health Association (ASHA): www.ashastd.org  Document Released: 12/08/2005 Document Revised: 03/01/2012 Document Reviewed: 05/31/2009 [facility] Patient Information 2014 [place], [place].   Provera Challenge: Provera 10 mg daily for 10 days.  Then within 2-3 weeks will have a withdrawal bleed.  Keep menses calendar and call back when cycle is finished with the days and amount of bleeding.  If prolonged bleeding call earlier.

## 2013-05-23 NOTE — Progress Notes (Signed)
Subjective:     Patient ID: Natalie Mcdaniel, female   DOB: 11/14/1966, 47 y.o.   MRN: [id number]  47 yo Sep AM Fe G3, P2, A1 who is dating the  same partner for 4 years.  For the past 2 years he has had another relationship.  Currently no pelvic pain, but vaginal discharge for 1 week. Discharge is clear, yellowish tint, without odor. No urinary symptoms.   Vaginal Discharge The patient's primary symptoms include a vaginal discharge. This is a new problem. The current episode started in the past 7 days. The problem occurs daily. The problem has been unchanged. The patient is experiencing no pain. She is not pregnant (BTL 1998). Associated symptoms include diarrhea. Pertinent negatives include no abdominal pain, back pain, chills, constipation, dysuria, fever, flank pain, frequency, hematuria, nausea, painful intercourse, rash, urgency or vomiting. The vaginal discharge was clear, milky, thin and yellow. There has been no bleeding (LMP 12/2012). She has not been passing clots. She has not been passing tissue. Nothing aggravates the symptoms. She has tried nothing for the symptoms. 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 It is unknown whether or not her partner has an STD. She uses tubal ligation for contraception. Her menstrual history has been irregular (Menses this past year irregular.). There is no history of an abdominal surgery, endometriosis, ovarian cysts, PID or an STD.   Review of Systems  Constitutional: Negative for fever and chills.  Respiratory: Negative.   Cardiovascular: Negative.   Gastrointestinal: Positive for diarrhea. Negative for nausea, vomiting, abdominal pain, constipation, blood in stool, abdominal distention and rectal pain.       Usual loose stools related to diet.  Endocrine:       Some vaso symptoms that are mild and tolerable.  Genitourinary: Positive for vaginal discharge. Negative for dysuria, urgency, frequency, hematuria, flank pain, vaginal pain and  dyspareunia.       PMP was in the fall. LMP January 2014 and lasted for 15 days and very heavy.  No menses since. S/P BTL 1998. During episode of LMP had dizzy, light-headed and had to take extra iron secondary to the heavy flow.  Musculoskeletal: Negative.  Negative for back pain.  Skin: Negative.  Negative for rash.  Psychiatric/Behavioral: Negative.        Has history of personality disorder.       Objective:   Physical Exam  Constitutional: She is oriented to person, place, and time. She appears well-developed and well-nourished.  Cardiovascular: Normal rate and normal heart sounds.   Pulmonary/Chest: Effort normal.  Abdominal: Soft. She exhibits no distension and no mass. There is no tenderness. There is no rebound and no guarding.  Genitourinary:  Thin watery grey vaginal discharge.  Wet prep: Ph - 5.5, saline wet prep + clue cells, no trich, KOH - negative. Gc and Chl were obtained per request.  Musculoskeletal: Normal range of motion.  Neurological: She is alert and oriented to person, place, and time.  Psychiatric: She has a normal mood and affect. Her behavior is normal. Thought content normal.       Assessment:     Bacterial vaginitis  Amenorrhea with history of menorrhagia in January with last cycle of 15 days.  S/P BTL  R/O STD's    Plan:     Metrogel vaginal gel hs X 5  Diflucan 150 mg if needed  Provera 10 mg challenge CB with results  CB if heavy or prolonged bleeding

## 2013-05-24 LAB — STD PANEL

## 2013-05-25 NOTE — Progress Notes (Addendum)
Reviewed personally.  M. Suzanne Adeliz Tonkinson, MD.  

## 2013-05-26 ENCOUNTER — Telehealth: Payer: Self-pay | Admitting: *Deleted

## 2013-05-26 NOTE — Telephone Encounter (Signed)
Message copied by Osie Bond on Thu May 26, 2013 10:12 AM ------      Message from: Ria Comment R      Created: Thu May 26, 2013  9:33 AM       Let patient know results ------

## 2013-05-26 NOTE — Telephone Encounter (Signed)
LVM for pt to return my call in regards to lab results.  

## 2013-06-01 ENCOUNTER — Telehealth: Payer: Self-pay | Admitting: *Deleted

## 2013-06-01 NOTE — Telephone Encounter (Signed)
Message copied by Osie Bond on Wed Jun 01, 2013 12:54 PM ------      Message from: Ria Comment R      Created: Thu May 26, 2013  9:33 AM       Let patient know results ------

## 2013-06-01 NOTE — Telephone Encounter (Signed)
Pt is aware of negative lab results.  

## 2013-06-02 ENCOUNTER — Encounter: Payer: Self-pay | Admitting: Family Medicine

## 2013-06-02 ENCOUNTER — Ambulatory Visit (INDEPENDENT_AMBULATORY_CARE_PROVIDER_SITE_OTHER): Payer: Managed Care, Other (non HMO) | Admitting: Family Medicine

## 2013-06-02 VITALS — BP 128/72 | HR 95 | Temp 98.2°F | Resp 16 | Ht 65.75 in | Wt 173.2 lb

## 2013-06-02 DIAGNOSIS — F1011 Alcohol abuse, in remission: Secondary | ICD-10-CM

## 2013-06-02 DIAGNOSIS — Z0000 Encounter for general adult medical examination without abnormal findings: Secondary | ICD-10-CM

## 2013-06-02 DIAGNOSIS — Z23 Encounter for immunization: Secondary | ICD-10-CM

## 2013-06-02 NOTE — Patient Instructions (Addendum)
Keeping You Healthy  Get These Tests 1. Blood Pressure- Have your blood pressure checked once a year by your health care provider.  Normal blood pressure is 120/80. 2. Weight- Have your body mass index (BMI) calculated to screen for obesity.  BMI is measure of body fat based on height and weight.  You can also calculate your own BMI at https://www.west-esparza.com/. 3. Cholesterol- Have your cholesterol checked every 5 years starting at age 49 then yearly starting at age 75. 4. Chlamydia, HIV, and other sexually transmitted diseases- Get screened every year until age 28, then within three months of each new sexual provider. 5. Pap Smear- Every 1-3 years; discuss with your health care provider. 6. Mammogram- Every year starting at age 50  Take these medicines  Calcium with Vitamin D-Your body needs 1200 mg of Calcium each day and [phone] IU of Vitamin D daily.  Your body can only absorb 500 mg of Calcium at a time so Calcium must be taken in 2 or 3 divided doses throughout the day.  Multivitamin with folic acid- Once daily if it is possible for you to become pregnant.  Get these Immunizations  Menactra-Single dose; prevents meningitis.  Tetanus shot- Every 10 years. You received the Tdap today; this is a one-time immunization. Next Tetanus is due in 2024.  Flu shot-Every year.  Take these steps 1. Do not smoke-Your healthcare provider can help you quit.  For tips on how to quit go to www.smokefree.gov or call 1-800 QUITNOW. 2. Be physically active- Exercise 5 days a week for at least 30 minutes.  If you are not already physically active, start slow and gradually work up to 30 minutes of moderate physical activity.  Examples of moderate activity include walking briskly, dancing, swimming, bicycling, etc. 3. Breast Cancer- A self breast exam every month is important for early detection of breast cancer.  For more information and instruction on self breast exams, ask your healthcare provider or  [place].es. 4. Eat a healthy diet- Eat a variety of healthy foods such as fruits, vegetables, whole grains, low fat milk, low fat cheeses, yogurt, lean meats, poultry and fish, beans, nuts, tofu, etc.  For more information go to www. Thenutritionsource.org 5. Drink alcohol in moderation- Limit alcohol intake to one drink or less per day. Never drink and drive.  6. Depression- Your emotional health is as important as your physical health.  If you're feeling down or losing interest in things you normally enjoy please talk to your healthcare provider about being screened for depression. 7. Dental visit- Brush and floss your teeth twice daily; visit your dentist twice a year. 8. Eye doctor- Get an eye exam at least every 2 years. 9. Helmet use- Always wear a helmet when riding a bicycle, motorcycle, rollerblading or skateboarding. 10. Safe sex- If you may be exposed to sexually transmitted infections, use a condom. 11. Seat belts- Seat belts can save your live; always wear one. 12. Smoke/Carbon Monoxide detectors- These detectors need to be installed on the appropriate level of your home. Replace batteries at least once a year. 13. Skin cancer- When out in the sun please cover up and use sunscreen 15 SPF or higher. 14. Violence- If anyone is threatening or hurting you, please tell your healthcare provider.   Continue to limit your alcohol intake to 2 glasses of wine less than 3-4 days a week. Contact the office if you have any questions or concerns in the future.

## 2013-06-02 NOTE — Progress Notes (Signed)
Subjective:    Patient ID: Natalie Mcdaniel, female    DOB: 30-Dec-1965, 47 y.o.   MRN: [id number]  HPI  This 47 y.o. [name] is here for CPE; she had GYN exam earlier this month at [facility] ([facility]  ordered by GYN and report pending). Pt has hx of anxiety d/o and alcoholism; she is not longer  taking Alprazolam or  Sertraline. She is limiting her wine intake to 49-4 glasses on weekends.   She often shares a bottle of wine w/ a friend. Pt stays busy with gardening and is eating healthier.  She feels less stressed and is sleeping well. She feels that her overall health is good.   Pt has 47 annual vision evaluation; she wears corrective lenses.   PMHx, Soc Hx and Fam Hx reviewed. Problem List reviewed.   Review of Systems  Constitutional: Positive for diaphoresis.  HENT: Negative.   Eyes: Negative.   Respiratory: Negative.   Cardiovascular: Negative.   Gastrointestinal: Negative.   Endocrine: Negative.   Genitourinary: Positive for menstrual problem.  Musculoskeletal: Negative.   Skin: Negative.   Allergic/Immunologic: Negative.   Neurological: Negative.   Hematological: Negative.   Psychiatric/Behavioral: Negative.        Objective:   Physical Exam  Nursing note and vitals reviewed. Constitutional: She is oriented to person, place, and time. Vital signs are normal. She appears well-developed and well-nourished. No distress.  HENT:  Head: Normocephalic and atraumatic.  Right Ear: Hearing, tympanic membrane, external ear and ear canal normal.  Left Ear: Hearing, tympanic membrane, external ear and ear canal normal.  Nose: Mucosal edema present. No rhinorrhea, nasal deformity or septal deviation. Right sinus exhibits no maxillary sinus tenderness and no frontal sinus tenderness. Left sinus exhibits no maxillary sinus tenderness and no frontal sinus tenderness.  Mouth/Throat: Uvula is midline, oropharynx is clear and moist and mucous membranes are normal. No oral lesions. Normal dentition.  No dental caries.  Eyes: Conjunctivae, EOM and lids are normal. Pupils are equal, round, and reactive to light. No scleral icterus.  Fundoscopic exam:      The right eye shows no arteriolar narrowing and no AV nicking. The right eye shows red reflex.       The left eye shows no arteriolar narrowing and no AV nicking. The left eye shows red reflex.  Fundi difficult to assess.  Neck: Normal range of motion. Neck supple. No thyromegaly present.  Cardiovascular: Normal rate, regular rhythm, normal heart sounds and intact distal pulses.  Exam reveals no gallop and no friction rub.   No murmur heard. Pulmonary/Chest: Effort normal and breath sounds normal. No respiratory distress. She exhibits no tenderness.  Abdominal: Soft. Normal appearance. She exhibits no distension, no abdominal bruit, no pulsatile midline mass and no mass. Bowel sounds are increased. There is no hepatosplenomegaly. There is no tenderness. There is no guarding and no CVA tenderness. No hernia.  Well healed midline scar.  Musculoskeletal: Normal range of motion. She exhibits no tenderness.  Lymphadenopathy:       Head (right side): No submental, no submandibular, no tonsillar, no preauricular, no posterior auricular and no occipital adenopathy present.       Head (left side): No submental, no submandibular, no tonsillar, no preauricular, no posterior auricular and no occipital adenopathy present.    She has no cervical adenopathy.       Right cervical: No superficial cervical and no posterior cervical adenopathy present.      Left cervical: No superficial cervical and  no posterior cervical adenopathy present.    She has no axillary adenopathy.       Right: No supraclavicular adenopathy present.       Left: No supraclavicular adenopathy present.  Neurological: She is alert and oriented to person, place, and time. She has normal strength. She is not disoriented. She displays no atrophy and no tremor. No cranial nerve deficit or  sensory deficit. She exhibits normal muscle tone. She displays a negative Romberg sign. Coordination normal.  Reflex Scores:      Tricep reflexes are 1+ on the right side and 1+ on the left side.      Bicep reflexes are 1+ on the right side and 1+ on the left side.      Patellar reflexes are 2+ on the right side and 2+ on the left side.      Achilles reflexes are 1+ on the right side and 1+ on the left side. Skin: Skin is warm and dry. No rash noted. She is not diaphoretic. No erythema.  Psychiatric: She has a normal mood and affect. Her behavior is normal. Judgment and thought content normal.    Recent labs reviewed with pt; elevated AST= 50 in Jan 2014 (pt admits this was at a time when she was consuming excessive amounts of alcohol).     Assessment & Plan:  Routine general medical examination at a health care facility  Need for prophylactic vaccination with combined diphtheria-tetanus-pertussis (DTP) vaccine - Plan: Tdap vaccine greater than or equal to 7yo IM  History of alcohol abuse- advised pt about health risks associated with excessive alcohol intake. Pt agrees to limit intake.

## 2013-09-21 ENCOUNTER — Emergency Department (HOSPITAL_COMMUNITY)
Admission: EM | Admit: 2013-09-21 | Discharge: 2013-09-22 | Disposition: A | Discharge status: Other Acute Inpatient Hospital | Payer: Managed Care, Other (non HMO) | Attending: Emergency Medicine | Admitting: Emergency Medicine

## 2013-09-21 ENCOUNTER — Encounter (HOSPITAL_COMMUNITY): Payer: Self-pay | Admitting: Emergency Medicine

## 2013-09-21 DIAGNOSIS — F10229 Alcohol dependence with intoxication, unspecified: Secondary | ICD-10-CM | POA: Insufficient documentation

## 2013-09-21 DIAGNOSIS — Z3202 Encounter for pregnancy test, result negative: Secondary | ICD-10-CM | POA: Insufficient documentation

## 2013-09-21 DIAGNOSIS — Z79899 Other long term (current) drug therapy: Secondary | ICD-10-CM | POA: Insufficient documentation

## 2013-09-21 DIAGNOSIS — F3289 Other specified depressive episodes: Secondary | ICD-10-CM | POA: Insufficient documentation

## 2013-09-21 DIAGNOSIS — R259 Unspecified abnormal involuntary movements: Secondary | ICD-10-CM | POA: Insufficient documentation

## 2013-09-21 DIAGNOSIS — F329 Major depressive disorder, single episode, unspecified: Secondary | ICD-10-CM | POA: Insufficient documentation

## 2013-09-21 DIAGNOSIS — IMO0002 Reserved for concepts with insufficient information to code with codable children: Secondary | ICD-10-CM | POA: Insufficient documentation

## 2013-09-21 DIAGNOSIS — F1020 Alcohol dependence, uncomplicated: Secondary | ICD-10-CM

## 2013-09-21 DIAGNOSIS — F411 Generalized anxiety disorder: Secondary | ICD-10-CM | POA: Insufficient documentation

## 2013-09-21 DIAGNOSIS — R61 Generalized hyperhidrosis: Secondary | ICD-10-CM | POA: Insufficient documentation

## 2013-09-21 NOTE — ED Notes (Signed)
Bed: [facility]1 Expected date:  Expected time:  Means of arrival:  Comments: monarch

## 2013-09-21 NOTE — ED Notes (Signed)
Per EMS, pt binged on beer and has not had a drink for the past 48 hours.  Pt report having a seizure, no noted seizure activity.  Pt IVC, from [facility] and can return after medical clearance.

## 2013-09-22 ENCOUNTER — Emergency Department (HOSPITAL_COMMUNITY)
Admission: EM | Admit: 2013-09-22 | Discharge: 2013-09-22 | Disposition: A | Discharge status: Home / Self Care | Payer: Federal, State, Local not specified - Other | Attending: Emergency Medicine | Admitting: Emergency Medicine

## 2013-09-22 ENCOUNTER — Encounter (HOSPITAL_COMMUNITY): Payer: Self-pay | Admitting: Emergency Medicine

## 2013-09-22 DIAGNOSIS — F411 Generalized anxiety disorder: Secondary | ICD-10-CM | POA: Diagnosis not present

## 2013-09-22 DIAGNOSIS — IMO0002 Reserved for concepts with insufficient information to code with codable children: Secondary | ICD-10-CM | POA: Insufficient documentation

## 2013-09-22 DIAGNOSIS — F1010 Alcohol abuse, uncomplicated: Secondary | ICD-10-CM | POA: Diagnosis not present

## 2013-09-22 DIAGNOSIS — Z008 Encounter for other general examination: Secondary | ICD-10-CM

## 2013-09-22 DIAGNOSIS — E669 Obesity, unspecified: Secondary | ICD-10-CM | POA: Insufficient documentation

## 2013-09-22 DIAGNOSIS — Z79899 Other long term (current) drug therapy: Secondary | ICD-10-CM | POA: Insufficient documentation

## 2013-09-22 DIAGNOSIS — R259 Unspecified abnormal involuntary movements: Secondary | ICD-10-CM | POA: Insufficient documentation

## 2013-09-22 DIAGNOSIS — F3289 Other specified depressive episodes: Secondary | ICD-10-CM | POA: Diagnosis not present

## 2013-09-22 DIAGNOSIS — F329 Major depressive disorder, single episode, unspecified: Secondary | ICD-10-CM

## 2013-09-22 DIAGNOSIS — Z8659 Personal history of other mental and behavioral disorders: Secondary | ICD-10-CM | POA: Insufficient documentation

## 2013-09-22 LAB — POCT I-STAT, CHEM 8
Calcium, Ion: 1.13 mmol/L (ref 1.12–1.23)
HCT: 38 % (ref 36.0–46.0)
Hemoglobin: 12.9 g/dL (ref 12.0–15.0)
Sodium: 133 mEq/L — ABNORMAL LOW (ref 135–145)
TCO2: 23 mmol/L (ref 0–100)

## 2013-09-22 LAB — CBC
Platelets: 104 10*3/uL — ABNORMAL LOW (ref 150–400)
RDW: 19.1 % — ABNORMAL HIGH (ref 11.5–15.5)
WBC: 4.6 10*3/uL (ref 4.0–10.5)

## 2013-09-22 LAB — RAPID URINE DRUG SCREEN, HOSP PERFORMED
Amphetamines: NOT DETECTED
Barbiturates: NOT DETECTED
Benzodiazepines: NOT DETECTED
Cocaine: NOT DETECTED
Opiates: NOT DETECTED
Tetrahydrocannabinol: NOT DETECTED

## 2013-09-22 LAB — ETHANOL: Alcohol, Ethyl (B): <11 mg/dL (ref 0–11)

## 2013-09-22 LAB — COMPREHENSIVE METABOLIC PANEL
AST: 158 U/L — ABNORMAL HIGH (ref 0–37)
Albumin: 4.6 g/dL (ref 3.5–5.2)
Alkaline Phosphatase: 108 U/L (ref 39–117)
Chloride: 91 mEq/L — ABNORMAL LOW (ref 96–112)
Creatinine, Ser: 0.63 mg/dL (ref 0.50–1.10)
Potassium: 3.8 mEq/L (ref 3.5–5.1)
Total Bilirubin: 0.5 mg/dL (ref 0.3–1.2)

## 2013-09-22 LAB — POCT PREGNANCY, URINE: Preg Test, Ur: NEGATIVE

## 2013-09-22 MED ORDER — LORAZEPAM 1 MG PO TABS: 1.0000 mg | ORAL_TABLET | Freq: Four times a day (QID) | ORAL | Status: DC | PRN

## 2013-09-22 MED ORDER — ALUM & MAG HYDROXIDE-SIMETH 200-200-20 MG/5ML PO SUSP: 30.0000 mL | ORAL | Status: DC | PRN

## 2013-09-22 MED ORDER — ZOLPIDEM TARTRATE 5 MG PO TABS: 5.0000 mg | ORAL_TABLET | Freq: Every evening | ORAL | Status: DC | PRN

## 2013-09-22 MED ORDER — NICOTINE 21 MG/24HR TD PT24: 21.0000 mg | MEDICATED_PATCH | Freq: Every day | TRANSDERMAL | Status: DC

## 2013-09-22 MED ORDER — ACETAMINOPHEN 325 MG PO TABS: 650.0000 mg | ORAL_TABLET | ORAL | Status: DC | PRN

## 2013-09-22 MED ORDER — LORAZEPAM 1 MG PO TABS: 1.0000 mg | ORAL_TABLET | Freq: Three times a day (TID) | ORAL | Status: DC | PRN

## 2013-09-22 MED ORDER — CHLORDIAZEPOXIDE HCL 25 MG PO CAPS
25.0000 mg | ORAL_CAPSULE | Freq: Once | ORAL | Status: AC
Start: 2013-09-22 — End: 2013-09-22
  Administered 2013-09-22: 25 mg via ORAL
  Filled 2013-09-22: qty 1

## 2013-09-22 MED ORDER — ONDANSETRON HCL 4 MG PO TABS: 4.0000 mg | ORAL_TABLET | Freq: Three times a day (TID) | ORAL | Status: DC | PRN

## 2013-09-22 MED ORDER — IBUPROFEN 200 MG PO TABS: 600.0000 mg | ORAL_TABLET | Freq: Three times a day (TID) | ORAL | Status: DC | PRN
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
## 2013-09-22 NOTE — ED Notes (Addendum)
No s/s's of ETOH withdrawal, no tremors, denies N/V at this time-pt states last drink 3 days ago

## 2013-09-22 NOTE — Progress Notes (Signed)
Pt to Triage desk requesting assistance to "stop the beeping noise" CM noted beeping from monitors in her room and across the hall CM turned off the monitor in her room and provided her with red socks per her request Triage RN notified
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
## 2013-09-22 NOTE — ED Provider Notes (Signed)
Medical screening examination/treatment/procedure(s) were performed by non-physician practitioner and as supervising physician I was immediately available for consultation/collaboration.   Gilda Crease, MD 09/22/13 [phone]

## 2013-09-22 NOTE — [facility] Counselor (Signed)
Writer was informed by the nurse working with the patient Natalie Mcdaniel) that the patient did not need an assessment due to the patient going back to [name].  The patient was only at the hospital for a medical clearance in order to go back to [facility].   Writer informed the [name] Office and spoke to Berna Spare) so that the information can be noted in the log system at the office.
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
## 2013-09-22 NOTE — ED Provider Notes (Signed)
Medical screening examination/treatment/procedure(s) were performed by non-physician practitioner and as supervising physician I was immediately available for consultation/collaboration.  Sunnie Nielsen, MD 09/22/13 2259

## 2013-09-22 NOTE — ED Notes (Signed)
ETOH levels are going to [facility] lab

## 2013-09-22 NOTE — ED Notes (Signed)
IVC papers rescinded-no complaints at time of discharge-husband to pick up

## 2013-09-22 NOTE — ED Notes (Signed)
Per [employer], patient at [facility], IVC'ed for SI-states they don't do detox so they sent her here

## 2013-09-23 NOTE — Consult Note (Signed)
Note reviewed and agreed with  

## 2013-09-23 NOTE — ED Provider Notes (Signed)
Medical screening examination/treatment/procedure(s) were performed by non-physician practitioner and as supervising physician I was immediately available for consultation/collaboration.    Christopher J. Pollina, MD 09/23/13 0709 

## 2013-12-26 ENCOUNTER — Emergency Department (HOSPITAL_COMMUNITY)
Admission: EM | Admit: 2013-12-26 | Discharge: 2013-12-26 | Disposition: A | Discharge status: Home / Self Care | Payer: BC Managed Care – PPO | Attending: Emergency Medicine | Admitting: Emergency Medicine

## 2013-12-26 ENCOUNTER — Encounter (HOSPITAL_COMMUNITY): Payer: Self-pay | Admitting: Emergency Medicine

## 2013-12-26 DIAGNOSIS — Z8709 Personal history of other diseases of the respiratory system: Secondary | ICD-10-CM | POA: Insufficient documentation

## 2013-12-26 DIAGNOSIS — F10929 Alcohol use, unspecified with intoxication, unspecified: Secondary | ICD-10-CM

## 2013-12-26 DIAGNOSIS — R209 Unspecified disturbances of skin sensation: Secondary | ICD-10-CM | POA: Insufficient documentation

## 2013-12-26 DIAGNOSIS — Z8659 Personal history of other mental and behavioral disorders: Secondary | ICD-10-CM | POA: Insufficient documentation

## 2013-12-26 DIAGNOSIS — G629 Polyneuropathy, unspecified: Secondary | ICD-10-CM

## 2013-12-26 DIAGNOSIS — F1010 Alcohol abuse, uncomplicated: Secondary | ICD-10-CM | POA: Insufficient documentation

## 2013-12-26 DIAGNOSIS — G589 Mononeuropathy, unspecified: Secondary | ICD-10-CM | POA: Insufficient documentation

## 2013-12-26 LAB — ETHANOL: ALCOHOL ETHYL (B): 332 mg/dL — AB (ref 0–11)

## 2013-12-26 LAB — POCT I-STAT, CHEM 8
BUN: 5 mg/dL — AB (ref 6–23)
CALCIUM ION: 1.08 mmol/L — AB (ref 1.12–1.23)
CHLORIDE: 99 meq/L (ref 96–112)
Creatinine, Ser: 1.2 mg/dL — ABNORMAL HIGH (ref 0.50–1.10)
GLUCOSE: 87 mg/dL (ref 70–99)
HCT: 44 % (ref 36.0–46.0)
Hemoglobin: 15 g/dL (ref 12.0–15.0)
Potassium: 4 mEq/L (ref 3.7–5.3)
Sodium: 141 mEq/L (ref 137–147)
TCO2: 28 mmol/L (ref 0–100)
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
## 2013-12-26 NOTE — ED Notes (Addendum)
Pt presents to the ED with c/o intoxication, pt states she feels "sick", denies n/v/d, pt c/o tingling to hand and feet onset x 3 weeks. Pt repeatedly saying she felt like she was going to go to sleep and not wake up. A & O. Pt denies SI/HI.

## 2013-12-26 NOTE — Discharge Instructions (Signed)
Alcohol Problems °Most adults who drink alcohol drink in moderation (not a lot) are at low risk for developing problems related to their drinking. However, all drinkers, including low-risk drinkers, should know about the health risks connected with drinking alcohol. °RECOMMENDATIONS FOR LOW-RISK DRINKING  °Drink in moderation. Moderate drinking is defined as follows:  °· Men - no more than 2 drinks per day. °· Nonpregnant women - no more than 1 drink per day. °· Over age 65 - no more than 1 drink per day. °A standard drink is 12 grams of pure alcohol, which is equal to a 12 ounce bottle of beer or wine cooler, a 5 ounce glass of wine, or 1.5 ounces of distilled spirits (such as whiskey, brandy, vodka, or rum).  °ABSTAIN FROM (DO NOT DRINK) ALCOHOL: °· When pregnant or considering pregnancy. °· When taking a medication that interacts with alcohol. °· If you are alcohol dependent. °· A medical condition that prohibits drinking alcohol (such as ulcer, liver disease, or heart disease). °DISCUSS WITH YOUR CAREGIVER: °· If you are at risk for coronary heart disease, discuss the potential benefits and risks of alcohol use: Light to moderate drinking is associated with lower rates of coronary heart disease in certain populations (for example, men over age 45 and postmenopausal women). Infrequent or nondrinkers are advised not to begin light to moderate drinking to reduce the risk of coronary heart disease so as to avoid creating an alcohol-related problem. Similar protective effects can likely be gained through proper diet and exercise. °· Women and the elderly have smaller amounts of body water than men. As a result women and the elderly achieve a higher blood alcohol concentration after drinking the same amount of alcohol. °· Exposing a fetus to alcohol can cause a broad range of birth defects referred to as Fetal Alcohol Syndrome (FAS) or Alcohol-Related Birth Defects (ARBD). Although FAS/ARBD is connected with excessive  alcohol consumption during pregnancy, studies also have reported neurobehavioral problems in infants born to mothers reporting drinking an average of 1 drink per day during pregnancy. °· Heavier drinking (the consumption of more than 4 drinks per occasion by men and more than 3 drinks per occasion by women) impairs learning (cognitive) and psychomotor functions and increases the risk of alcohol-related problems, including accidents and injuries. °CAGE QUESTIONS:  °· Have you ever felt that you should Cut down on your drinking? °· Have people Annoyed you by criticizing your drinking? °· Have you ever felt bad or Guilty about your drinking? °· Have you ever had a drink first thing in the morning to steady your nerves or get rid of a hangover (Eye opener)? °If you answered positively to any of these questions: You may be at risk for alcohol-related problems if alcohol consumption is:  °· Men: Greater than 14 drinks per week or more than 4 drinks per occasion. °· Women: Greater than 7 drinks per week or more than 3 drinks per occasion. °Do you or your family have a medical history of alcohol-related problems, such as: °· Blackouts. °· Sexual dysfunction. °· Depression. °· Trauma. °· Liver dysfunction. °· Sleep disorders. °· Hypertension. °· Chronic abdominal pain. °· Has your drinking ever caused you problems, such as problems with your family, problems with your work (or school) performance, or accidents/injuries? °· Do you have a compulsion to drink or a preoccupation with drinking? °· Do you have poor control or are you unable to stop drinking once you have started? °· Do you have to drink to   avoid withdrawal symptoms?  Do you have problems with withdrawal such as tremors, nausea, sweats, or mood disturbances?  Does it take more alcohol than in the past to get you high?  Do you feel a strong urge to drink?  Do you change your plans so that you can have a drink?  Do you ever drink in the morning to relieve  the shakes or a hangover? If you have answered a number of the previous questions positively, it may be time for you to talk to your caregivers, family, and friends and see if they think you have a problem. Alcoholism is a chemical dependency that keeps getting worse and will eventually destroy your health and relationships. Many alcoholics end up dead, impoverished, or in prison. This is often the end result of all chemical dependency.  Do not be discouraged if you are not ready to take action immediately.  Decisions to change behavior often involve up and down desires to change and feeling like you cannot decide.  Try to think more seriously about your drinking behavior.  Think of the reasons to quit. WHERE TO GO FOR ADDITIONAL INFORMATION   [employer] on Alcohol Abuse and Alcoholism (NIAAA) http://www.bradshaw.com/  [employer] on Alcoholism and Drug Dependence (NCADD) www.ncadd.[place] (ASAM) http://carpenter.net/  Document Released: 12/08/2005 Document Revised: 03/01/2012 Document Reviewed: 07/26/2008 [facility] Patient Information 2014 [place].  Alcohol Intoxication Alcohol intoxication occurs when the amount of alcohol that a person has consumed impairs his or her ability to mentally and physically function. Alcohol directly impairs the normal chemical activity of the brain. Drinking large amounts of alcohol can lead to changes in mental function and behavior, and it can cause many physical effects that can be harmful.  Alcohol intoxication can range in severity from mild to very severe. Various factors can affect the level of intoxication that occurs, such as the person's age, gender, weight, frequency of alcohol consumption, and the presence of other medical conditions (such as diabetes, seizures, or heart conditions). Dangerous levels of alcohol intoxication may occur when people drink large amounts of alcohol in a short period (binge  drinking). Alcohol can also be especially dangerous when combined with certain prescription medicines or "recreational" drugs. SIGNS AND SYMPTOMS Some common signs and symptoms of mild alcohol intoxication include:  Loss of coordination.  Changes in mood and behavior.  Impaired judgment.  Slurred speech. As alcohol intoxication progresses to more severe levels, other signs and symptoms will appear. These may include:  Vomiting.  Confusion and impaired memory.  Slowed breathing.  Seizures.  Loss of consciousness. DIAGNOSIS  Your health care provider will take a medical history and perform a physical exam. You will be asked about the amount and type of alcohol you have consumed. Blood tests will be done to measure the concentration of alcohol in your blood. In many places, your blood alcohol level must be lower than 80 mg/dL (0.08%) to legally drive. However, many dangerous effects of alcohol can occur at much lower levels.  TREATMENT  People with alcohol intoxication often do not require treatment. Most of the effects of alcohol intoxication are temporary, and they go away as the alcohol naturally leaves the body. Your health care provider will monitor your condition until you are stable enough to go home. Fluids are sometimes given through an IV access tube to help prevent dehydration.  HOME CARE INSTRUCTIONS  Do not drive after drinking alcohol.  Stay hydrated. Drink enough water and  fluids to keep your urine clear or pale yellow. Avoid caffeine.   Only take over-the-counter or prescription medicines as directed by your health care provider.  SEEK MEDICAL CARE IF:   You have persistent vomiting.   You do not feel better after a few days.  You have frequent alcohol intoxication. Your health care provider can help determine if you should see a substance use treatment counselor. SEEK IMMEDIATE MEDICAL CARE IF:   You become shaky or tremble when you try to stop drinking.    You shake uncontrollably (seizure).   You throw up (vomit) blood. This may be bright red or may look like black coffee grounds.   You have blood in your stool. This may be bright red or may appear as a black, tarry, bad smelling stool.   You become lightheaded or faint.  MAKE SURE YOU:   Understand these instructions.  Will watch your condition.  Will get help right away if you are not doing well or get worse. Document Released: 09/17/2005 Document Revised: 08/10/2013 Document Reviewed: 05/13/2013 [facility] Patient Information 2014 [place].  Neuropathic Pain We often think that pain has a physical cause. If we get rid of the cause, the pain should go away. Nerves themselves can also cause pain. It is called neuropathic pain, which means nerve abnormality. It may be difficult for the patients who have it and for the treating caregivers. Pain is usually described as acute (short-lived) or chronic (long-lasting). Acute pain is related to the physical sensations caused by an injury. It can last from a few seconds to many weeks, but it usually goes away when normal healing occurs. Chronic pain lasts beyond the typical healing time. With neuropathic pain, the nerve fibers themselves may be damaged or injured. They then send incorrect signals to other pain centers. The pain you feel is real, but the cause is not easy to find.  CAUSES  Chronic pain can result from diseases, such as diabetes and shingles (an infection related to chickenpox), or from trauma, surgery, or amputation. It can also happen without any known injury or disease. The nerves are sending pain messages, even though there is no identifiable cause for such messages.   Other common causes of neuropathy include diabetes, phantom limb pain, or Regional Pain Syndrome (RPS).  As with all forms of chronic back pain, if neuropathy is not correctly treated, there can be a number of associated problems that lead to a downward  cycle for the patient. These include depression, sleeplessness, feelings of fear and anxiety, limited social interaction and inability to do normal daily activities or work.  The most dramatic and mysterious example of neuropathic pain is called "phantom limb syndrome." This occurs when an arm or a leg has been removed because of illness or injury. The brain still gets pain messages from the nerves that originally carried impulses from the missing limb. These nerves now seem to misfire and cause troubling pain.  Neuropathic pain often seems to have no cause. It responds poorly to standard pain treatment. Neuropathic pain can occur after:  Shingles (herpes zoster virus infection).  A lasting burning sensation of the skin, caused usually by injury to a peripheral nerve.  Peripheral neuropathy which is widespread nerve damage, often caused by diabetes or alcoholism.  Phantom limb pain following an amputation.  Facial nerve problems (trigeminal neuralgia).  Multiple sclerosis.  Reflex sympathetic dystrophy.  Pain which comes with cancer and cancer chemotherapy.  Entrapment neuropathy such as when pressure is  put on a nerve such as in carpal tunnel syndrome.  Back, leg, and hip problems (sciatica).  Spine or back surgery.  HIV Infection or AIDS where nerves are infected by viruses. Your caregiver can explain items in the above list which may apply to you. SYMPTOMS  Characteristics of neuropathic pain are:  Severe, sharp, electric shock-like, shooting, lightening-like, knife-like.  Pins and needles sensation.  Deep burning, deep cold, or deep ache.  Persistent numbness, tingling, or weakness.  Pain resulting from light touch or other stimulus that would not usually cause pain.  Increased sensitivity to something that would normally cause pain, such as a pinprick. Pain may persist for months or years following the healing of damaged tissues. When this happens, pain signals no  longer sound an alarm about current injuries or injuries about to happen. Instead, the alarm system itself is not working correctly.  Neuropathic pain may get worse instead of better over time. For some people, it can lead to serious disability. It is important to be aware that severe injury in a limb can occur without a proper, protective pain response.Burns, cuts, and other injuries may go unnoticed. Without proper treatment, these injuries can become infected or lead to further disability. Take any injury seriously, and consult your caregiver for treatment. DIAGNOSIS  When you have a pain with no known cause, your caregiver will probably ask some specific questions:   Do you have any other conditions, such as diabetes, shingles, multiple sclerosis, or HIV infection?  How would you describe your pain? (Neuropathic pain is often described as shooting, stabbing, burning, or searing.)  Is your pain worse at any time of the day? (Neuropathic pain is usually worse at night.)  Does the pain seem to follow a certain physical pathway?  Does the pain come from an area that has missing or injured nerves? (An example would be phantom limb pain.)  Is the pain triggered by minor things such as rubbing against the sheets at night? These questions often help define the type of pain involved. Once your caregiver knows what is happening, treatment can begin. Anticonvulsant, antidepressant drugs, and various pain relievers seem to work in some cases. If another condition, such as diabetes is involved, better management of that disorder may relieve the neuropathic pain.  TREATMENT  Neuropathic pain is frequently long-lasting and tends not to respond to treatment with narcotic type pain medication. It may respond well to other drugs such as antiseizure and antidepressant medications. Usually, neuropathic problems do not completely go away, but partial improvement is often possible with proper treatment. Your  caregivers have large numbers of medications available to treat you. Do not be discouraged if you do not get immediate relief. Sometimes different medications or a combination of medications will be tried before you receive the results you are hoping for. See your caregiver if you have pain that seems to be coming from nowhere and does not go away. Help is available.  SEEK IMMEDIATE MEDICAL CARE IF:   There is a sudden change in the quality of your pain, especially if the change is on only one side of the body.  You notice changes of the skin, such as redness, black or purple discoloration, swelling, or an ulcer.  You cannot move the affected limbs. Document Released: 09/04/2004 Document Revised: 03/01/2012 Document Reviewed: 09/04/2004 [facility] Patient Information 2014 [place], [place].

## 2013-12-26 NOTE — ED Provider Notes (Signed)
Medical screening examination/treatment/procedure(s) were performed by non-physician practitioner and as supervising physician I was immediately available for consultation/collaboration.  EKG Interpretation   None        Merryl Hacker, MD 12/26/13 [phone]

## 2014-01-07 ENCOUNTER — Encounter (HOSPITAL_COMMUNITY): Payer: Self-pay | Admitting: Emergency Medicine

## 2014-01-07 ENCOUNTER — Emergency Department (HOSPITAL_COMMUNITY)
Admission: EM | Admit: 2014-01-07 | Discharge: 2014-01-08 | Disposition: A | Discharge status: Home / Self Care | Payer: BC Managed Care – PPO | Attending: Emergency Medicine | Admitting: Emergency Medicine

## 2014-01-07 ENCOUNTER — Emergency Department (HOSPITAL_COMMUNITY): Payer: BC Managed Care – PPO

## 2014-01-07 DIAGNOSIS — Y929 Unspecified place or not applicable: Secondary | ICD-10-CM | POA: Insufficient documentation

## 2014-01-07 DIAGNOSIS — S6992XA Unspecified injury of left wrist, hand and finger(s), initial encounter: Secondary | ICD-10-CM

## 2014-01-07 DIAGNOSIS — IMO0001 Reserved for inherently not codable concepts without codable children: Secondary | ICD-10-CM

## 2014-01-07 DIAGNOSIS — S62639A Displaced fracture of distal phalanx of unspecified finger, initial encounter for closed fracture: Secondary | ICD-10-CM | POA: Insufficient documentation

## 2014-01-07 DIAGNOSIS — Z8659 Personal history of other mental and behavioral disorders: Secondary | ICD-10-CM | POA: Insufficient documentation

## 2014-01-07 DIAGNOSIS — S6980XA Other specified injuries of unspecified wrist, hand and finger(s), initial encounter: Secondary | ICD-10-CM | POA: Insufficient documentation

## 2014-01-07 DIAGNOSIS — F1020 Alcohol dependence, uncomplicated: Secondary | ICD-10-CM | POA: Insufficient documentation

## 2014-01-07 DIAGNOSIS — IMO0002 Reserved for concepts with insufficient information to code with codable children: Secondary | ICD-10-CM | POA: Insufficient documentation

## 2014-01-07 DIAGNOSIS — S6990XA Unspecified injury of unspecified wrist, hand and finger(s), initial encounter: Principal | ICD-10-CM | POA: Insufficient documentation

## 2014-01-07 DIAGNOSIS — Z9884 Bariatric surgery status: Secondary | ICD-10-CM | POA: Insufficient documentation

## 2014-01-07 DIAGNOSIS — S51809A Unspecified open wound of unspecified forearm, initial encounter: Secondary | ICD-10-CM | POA: Insufficient documentation

## 2014-01-07 DIAGNOSIS — Y9389 Activity, other specified: Secondary | ICD-10-CM | POA: Insufficient documentation

## 2014-01-07 DIAGNOSIS — Z8669 Personal history of other diseases of the nervous system and sense organs: Secondary | ICD-10-CM | POA: Insufficient documentation

## 2014-01-07 MED ORDER — SODIUM CHLORIDE 0.9 % IV BOLUS (SEPSIS)
500.0000 mL | Freq: Once | INTRAVENOUS | Status: AC
  Administered 2014-01-07: 500 mL via INTRAVENOUS

## 2014-01-07 MED ORDER — TETANUS-DIPHTH-ACELL PERTUSSIS 5-2.5-18.5 LF-MCG/0.5 IM SUSP: 0.5000 mL | Freq: Once | INTRAMUSCULAR | Status: DC

## 2014-01-07 MED ORDER — CEFAZOLIN SODIUM-DEXTROSE 2-3 GM-% IV SOLR
2.0000 g | Freq: Once | INTRAVENOUS | Status: AC
  Administered 2014-01-08: 2 g via INTRAVENOUS
  Filled 2014-01-07: qty 50

## 2014-01-07 MED ORDER — LORAZEPAM 1 MG PO TABS
2.0000 mg | ORAL_TABLET | Freq: Once | ORAL | Status: AC
  Administered 2014-01-07: 2 mg via ORAL
  Filled 2014-01-07: qty 2

## 2014-01-07 MED ORDER — BUPIVACAINE HCL (PF) 0.5 % IJ SOLN
50.0000 mL | Freq: Once | INTRAMUSCULAR | Status: DC
  Filled 2014-01-07: qty 60
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
## 2014-01-07 NOTE — ED Notes (Signed)
Bed: CB63 Expected date:  Expected time:  Means of arrival:  Comments: EMS ETOH, laceration to hand, ? Crush injury
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
## 2014-01-08 MED ORDER — CEPHALEXIN 500 MG PO CAPS: 500.0000 mg | ORAL_CAPSULE | Freq: Four times a day (QID) | ORAL | Status: DC

## 2014-01-08 NOTE — Consult Note (Signed)
  See Dictation #[id number] Amedeo Plenty MD
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
## 2014-01-08 NOTE — Discharge Instructions (Signed)
1. Medications: keflex, usual home medications 2. Treatment: rest, drink plenty of fluids, keep wound clean and bandage dry 3. Follow Up: Please followup with your primary doctor for discussion of your diagnoses and further evaluation after today's visit; if you do not have a primary care doctor use the resource guide provided to find one;
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
## 2014-04-04 ENCOUNTER — Encounter: Payer: Self-pay | Admitting: Obstetrics & Gynecology

## 2014-06-24 ENCOUNTER — Emergency Department (HOSPITAL_COMMUNITY)
Admission: EM | Admit: 2014-06-24 | Discharge: 2014-06-24 | Disposition: A | Discharge status: Home / Self Care | Payer: Managed Care, Other (non HMO) | Attending: Emergency Medicine | Admitting: Emergency Medicine

## 2014-06-24 ENCOUNTER — Encounter (HOSPITAL_COMMUNITY): Payer: Self-pay | Admitting: Emergency Medicine

## 2014-06-24 DIAGNOSIS — IMO0002 Reserved for concepts with insufficient information to code with codable children: Secondary | ICD-10-CM | POA: Insufficient documentation

## 2014-06-24 DIAGNOSIS — Z862 Personal history of diseases of the blood and blood-forming organs and certain disorders involving the immune mechanism: Secondary | ICD-10-CM | POA: Insufficient documentation

## 2014-06-24 DIAGNOSIS — F1010 Alcohol abuse, uncomplicated: Secondary | ICD-10-CM

## 2014-06-24 DIAGNOSIS — Z792 Long term (current) use of antibiotics: Secondary | ICD-10-CM | POA: Insufficient documentation

## 2014-06-24 DIAGNOSIS — Z8639 Personal history of other endocrine, nutritional and metabolic disease: Secondary | ICD-10-CM | POA: Insufficient documentation

## 2014-06-24 DIAGNOSIS — F1020 Alcohol dependence, uncomplicated: Secondary | ICD-10-CM | POA: Insufficient documentation

## 2014-06-24 DIAGNOSIS — Z8659 Personal history of other mental and behavioral disorders: Secondary | ICD-10-CM | POA: Insufficient documentation

## 2014-06-24 DIAGNOSIS — Z9884 Bariatric surgery status: Secondary | ICD-10-CM | POA: Insufficient documentation

## 2014-06-24 NOTE — ED Notes (Signed)
Immediately after triage pt states she is not waiting for a doctor, further states she will not be stuck with a needle. Pt states she will go back to AA. Pt states she was calling a cab to take her home.

## 2014-06-24 NOTE — ED Notes (Signed)
Bed: WLPT3 Expected date: 06/24/14 Expected time: 5:28 AM Means of arrival:  Comments: EMS requesting detox

## 2014-06-24 NOTE — ED Notes (Signed)
Pt transported to ED by EMS from home with request for etoh detox. 2 week binge, @18  beers daily. A & O.

## 2014-06-24 NOTE — ED Notes (Signed)
Pt ambulatory with steady gait, [employer] called cab for patient.
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
## 2014-07-31 ENCOUNTER — Emergency Department (HOSPITAL_COMMUNITY)
Admission: EM | Admit: 2014-07-31 | Discharge: 2014-07-31 | Discharge status: Against Medical Advice | Payer: BC Managed Care – PPO | Attending: Emergency Medicine | Admitting: Emergency Medicine

## 2014-07-31 ENCOUNTER — Encounter (HOSPITAL_COMMUNITY): Payer: Self-pay | Admitting: Emergency Medicine

## 2014-07-31 DIAGNOSIS — R4789 Other speech disturbances: Secondary | ICD-10-CM | POA: Diagnosis not present

## 2014-07-31 DIAGNOSIS — Z8659 Personal history of other mental and behavioral disorders: Secondary | ICD-10-CM | POA: Diagnosis not present

## 2014-07-31 DIAGNOSIS — R4182 Altered mental status, unspecified: Secondary | ICD-10-CM | POA: Insufficient documentation

## 2014-07-31 DIAGNOSIS — F1010 Alcohol abuse, uncomplicated: Secondary | ICD-10-CM | POA: Insufficient documentation

## 2014-07-31 DIAGNOSIS — E669 Obesity, unspecified: Secondary | ICD-10-CM | POA: Insufficient documentation

## 2014-07-31 DIAGNOSIS — F10929 Alcohol use, unspecified with intoxication, unspecified: Secondary | ICD-10-CM

## 2014-07-31 LAB — RAPID URINE DRUG SCREEN, HOSP PERFORMED
AMPHETAMINES: NOT DETECTED
BENZODIAZEPINES: NOT DETECTED
Barbiturates: NOT DETECTED
COCAINE: NOT DETECTED
OPIATES: NOT DETECTED
TETRAHYDROCANNABINOL: NOT DETECTED

## 2014-07-31 LAB — CBC WITH DIFFERENTIAL/PLATELET
BASOS ABS: 0 10*3/uL (ref 0.0–0.1)
Basophils Relative: 0 % (ref 0–1)
EOS ABS: 0 10*3/uL (ref 0.0–0.7)
Eosinophils Relative: 1 % (ref 0–5)
HCT: 40.8 % (ref 36.0–46.0)
Hemoglobin: 14.1 g/dL (ref 12.0–15.0)
Lymphocytes Relative: 41 % (ref 12–46)
Lymphs Abs: 3.1 10*3/uL (ref 0.7–4.0)
MCH: 31.1 pg (ref 26.0–34.0)
MCHC: 34.6 g/dL (ref 30.0–36.0)
MCV: 89.9 fL (ref 78.0–100.0)
Monocytes Absolute: 0.3 10*3/uL (ref 0.1–1.0)
Monocytes Relative: 5 % (ref 3–12)
Neutro Abs: 3.9 10*3/uL (ref 1.7–7.7)
Neutrophils Relative %: 53 % (ref 43–77)
PLATELETS: 122 10*3/uL — AB (ref 150–400)
RBC: 4.54 MIL/uL (ref 3.87–5.11)
RDW: 15.7 % — AB (ref 11.5–15.5)
WBC: 7.4 10*3/uL (ref 4.0–10.5)

## 2014-07-31 LAB — COMPREHENSIVE METABOLIC PANEL
ALBUMIN: 4.9 g/dL (ref 3.5–5.2)
ALT: 55 U/L — AB (ref 0–35)
AST: 76 U/L — AB (ref 0–37)
Alkaline Phosphatase: 115 U/L (ref 39–117)
Anion gap: 20 — ABNORMAL HIGH (ref 5–15)
BUN: 4 mg/dL — ABNORMAL LOW (ref 6–23)
CALCIUM: 9.5 mg/dL (ref 8.4–10.5)
CO2: 24 mEq/L (ref 19–32)
Chloride: 96 mEq/L (ref 96–112)
Creatinine, Ser: 0.61 mg/dL (ref 0.50–1.10)
GFR calc Af Amer: >90 mL/min (ref >90)
Glucose, Bld: 95 mg/dL (ref 70–99)
Potassium: 4.1 mEq/L (ref 3.7–5.3)
SODIUM: 140 meq/L (ref 137–147)
Total Bilirubin: 0.4 mg/dL (ref 0.3–1.2)
Total Protein: 9.1 g/dL — ABNORMAL HIGH (ref 6.0–8.3)

## 2014-07-31 LAB — ETHANOL: Alcohol, Ethyl (B): 396 mg/dL — ABNORMAL HIGH (ref 0–11)

## 2014-07-31 MED ORDER — IBUPROFEN 200 MG PO TABS: 600.0000 mg | ORAL_TABLET | Freq: Three times a day (TID) | ORAL | Status: DC | PRN

## 2014-07-31 MED ORDER — LORAZEPAM 1 MG PO TABS: 0.0000 mg | ORAL_TABLET | Freq: Four times a day (QID) | ORAL | Status: DC

## 2014-07-31 MED ORDER — ONDANSETRON HCL 4 MG PO TABS
4.0000 mg | ORAL_TABLET | Freq: Three times a day (TID) | ORAL | Status: DC | PRN
Start: 2014-07-31 — End: 2014-07-31

## 2014-07-31 MED ORDER — ALUM & MAG HYDROXIDE-SIMETH 200-200-20 MG/5ML PO SUSP
30.0000 mL | ORAL | Status: DC | PRN
Start: 2014-07-31 — End: 2014-07-31

## 2014-07-31 MED ORDER — ZOLPIDEM TARTRATE 5 MG PO TABS: 5.0000 mg | ORAL_TABLET | Freq: Every evening | ORAL | Status: DC | PRN

## 2014-07-31 MED ORDER — ACETAMINOPHEN 325 MG PO TABS
650.0000 mg | ORAL_TABLET | ORAL | Status: DC | PRN
Start: 2014-07-31 — End: 2014-07-31

## 2014-07-31 MED ORDER — NICOTINE 21 MG/24HR TD PT24: 21.0000 mg | MEDICATED_PATCH | Freq: Every day | TRANSDERMAL | Status: DC

## 2014-07-31 MED ORDER — LORAZEPAM 1 MG PO TABS: 0.0000 mg | ORAL_TABLET | Freq: Two times a day (BID) | ORAL | Status: DC

## 2014-07-31 MED ORDER — THIAMINE HCL 100 MG/ML IJ SOLN: 100.0000 mg | Freq: Every day | INTRAMUSCULAR | Status: DC

## 2014-07-31 MED ORDER — VITAMIN B-1 100 MG PO TABS: 100.0000 mg | ORAL_TABLET | Freq: Every day | ORAL | Status: DC

## 2014-07-31 NOTE — ED Notes (Signed)
Bed: WLPT3 Expected date:  Expected time:  Means of arrival:  Comments: EMS 48 yo female/wants detox

## 2014-07-31 NOTE — BH Assessment (Signed)
Unable to complete assessment because pt wanted to go home. Pt was encouraged to return to the ED if she wanted detox.

## 2014-07-31 NOTE — BH Assessment (Signed)
Received a call for a tele-assessment. Spoke with Domenic Moras, PA-C who reported that pt is a chronic alcoholic and is requesting detox. Assessment will be initiated.

## 2014-07-31 NOTE — ED Provider Notes (Signed)
Medical screening examination/treatment/procedure(s) were performed by non-physician practitioner and as supervising physician I was immediately available for consultation/collaboration.   EKG Interpretation None        Merryl Hacker, MD 07/31/14 [phone]
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
## 2014-07-31 NOTE — ED Notes (Signed)
Pt at triage desk saying that she was going to leave, pt currently calling herself a cab, pt was brought in by EMS and has no vehicle here.

## 2014-07-31 NOTE — ED Notes (Signed)
Assessment counselor in taking to the patient now

## 2014-07-31 NOTE — ED Notes (Signed)
Pt still wanted to leave AMA after talking to the assessment counselor, she is aware, pt called a cab and left the ED

## 2014-07-31 NOTE — ED Notes (Signed)
Pt wanting detox from ETOH, lost her job a year ago and has been drinking since, pt acting very panicky in triage and EMS reports that she acts this way when she drinks.

## 2014-07-31 NOTE — ED Notes (Signed)
Pt arrived to the ED with a complaint of alcohol problem.  Pt has etoh on board.  Pt last drink was prior to arrival

## 2014-08-25 ENCOUNTER — Emergency Department (HOSPITAL_COMMUNITY)
Admission: EM | Admit: 2014-08-25 | Discharge: 2014-08-25 | Disposition: A | Discharge status: Home / Self Care | Payer: BC Managed Care – PPO | Attending: Emergency Medicine | Admitting: Emergency Medicine

## 2014-08-25 ENCOUNTER — Encounter (HOSPITAL_COMMUNITY): Payer: Self-pay | Admitting: Emergency Medicine

## 2014-08-25 ENCOUNTER — Emergency Department (HOSPITAL_COMMUNITY): Payer: BC Managed Care – PPO

## 2014-08-25 DIAGNOSIS — Z8659 Personal history of other mental and behavioral disorders: Secondary | ICD-10-CM | POA: Insufficient documentation

## 2014-08-25 DIAGNOSIS — Y9289 Other specified places as the place of occurrence of the external cause: Secondary | ICD-10-CM | POA: Insufficient documentation

## 2014-08-25 DIAGNOSIS — W230XXA Caught, crushed, jammed, or pinched between moving objects, initial encounter: Secondary | ICD-10-CM | POA: Insufficient documentation

## 2014-08-25 DIAGNOSIS — S41112A Laceration without foreign body of left upper arm, initial encounter: Secondary | ICD-10-CM

## 2014-08-25 DIAGNOSIS — Z9884 Bariatric surgery status: Secondary | ICD-10-CM | POA: Diagnosis not present

## 2014-08-25 DIAGNOSIS — S41109A Unspecified open wound of unspecified upper arm, initial encounter: Secondary | ICD-10-CM | POA: Insufficient documentation

## 2014-08-25 DIAGNOSIS — Y9389 Activity, other specified: Secondary | ICD-10-CM | POA: Diagnosis not present

## 2014-08-25 DIAGNOSIS — F1021 Alcohol dependence, in remission: Secondary | ICD-10-CM | POA: Diagnosis not present

## 2014-08-25 DIAGNOSIS — S41111A Laceration without foreign body of right upper arm, initial encounter: Secondary | ICD-10-CM
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
## 2014-08-25 NOTE — ED Notes (Signed)
Patient out in the hall removing her hospital gown requesting to go home "now".

## 2014-08-25 NOTE — ED Notes (Signed)
Patient transported to X-ray 

## 2014-08-25 NOTE — ED Notes (Signed)
Patient has lacerations to both arms. Blood pressure will not be taken.
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
## 2014-08-25 NOTE — ED Notes (Signed)
MD at bedside. 

## 2014-08-25 NOTE — Discharge Instructions (Signed)
Keep your wounds clean and dry.    Laceration Care, Adult A laceration is a cut or lesion that goes through all layers of the skin and into the tissue just beneath the skin. TREATMENT  Some lacerations may not require closure. Some lacerations may not be able to be closed due to an increased risk of infection. It is important to see your caregiver as soon as possible after an injury to minimize the risk of infection and maximize the opportunity for successful closure. If closure is appropriate, pain medicines may be given, if needed. The wound will be cleaned to help prevent infection. Your caregiver will use stitches (sutures), staples, wound glue (adhesive), or skin adhesive strips to repair the laceration. These tools bring the skin edges together to allow for faster healing and a better cosmetic outcome. However, all wounds will heal with a scar. Once the wound has healed, scarring can be minimized by covering the wound with sunscreen during the day for 1 full year. HOME CARE INSTRUCTIONS  For sutures or staples:  Keep the wound clean and dry.  If you were given a bandage (dressing), you should change it at least once a day. Also, change the dressing if it becomes wet or dirty, or as directed by your caregiver.  Wash the wound with soap and water 2 times a day. Rinse the wound off with water to remove all soap. Pat the wound dry with a clean towel.  After cleaning, apply a thin layer of the antibiotic ointment as recommended by your caregiver. This will help prevent infection and keep the dressing from sticking.  You may shower as usual after the first 24 hours. Do not soak the wound in water until the sutures are removed.  Only take over-the-counter or prescription medicines for pain, discomfort, or fever as directed by your caregiver.  Get your sutures or staples removed as directed by your caregiver. For skin adhesive strips:  Keep the wound clean and dry.  Do not get the skin  adhesive strips wet. You may bathe carefully, using caution to keep the wound dry.  If the wound gets wet, pat it dry with a clean towel.  Skin adhesive strips will fall off on their own. You may trim the strips as the wound heals. Do not remove skin adhesive strips that are still stuck to the wound. They will fall off in time. For wound adhesive:  You may briefly wet your wound in the shower or bath. Do not soak or scrub the wound. Do not swim. Avoid periods of heavy perspiration until the skin adhesive has fallen off on its own. After showering or bathing, gently pat the wound dry with a clean towel.  Do not apply liquid medicine, cream medicine, or ointment medicine to your wound while the skin adhesive is in place. This may loosen the film before your wound is healed.  If a dressing is placed over the wound, be careful not to apply tape directly over the skin adhesive. This may cause the adhesive to be pulled off before the wound is healed.  Avoid prolonged exposure to sunlight or tanning lamps while the skin adhesive is in place. Exposure to ultraviolet light in the first year will darken the scar.  The skin adhesive will usually remain in place for 5 to 10 days, then naturally fall off the skin. Do not pick at the adhesive film. You may need a tetanus shot if:  You cannot remember when you had your last  tetanus shot.  You have never had a tetanus shot. If you get a tetanus shot, your arm may swell, get red, and feel warm to the touch. This is common and not a problem. If you need a tetanus shot and you choose not to have one, there is a rare chance of getting tetanus. Sickness from tetanus can be serious. SEEK MEDICAL CARE IF:   You have redness, swelling, or increasing pain in the wound.  You see a red line that goes away from the wound.  You have yellowish-white fluid (pus) coming from the wound.  You have a fever.  You notice a bad smell coming from the wound or  dressing.  Your wound breaks open before or after sutures have been removed.  You notice something coming out of the wound such as wood or glass.  Your wound is on your hand or foot and you cannot move a finger or toe. SEEK IMMEDIATE MEDICAL CARE IF:   Your pain is not controlled with prescribed medicine.  You have severe swelling around the wound causing pain and numbness or a change in color in your arm, hand, leg, or foot.  Your wound splits open and starts bleeding.  You have worsening numbness, weakness, or loss of function of any joint around or beyond the wound.  You develop painful lumps near the wound or on the skin anywhere on your body. MAKE SURE YOU:   Understand these instructions.  Will watch your condition.  Will get help right away if you are not doing well or get worse. Document Released: 12/08/2005 Document Revised: 03/01/2012 Document Reviewed: 06/03/2011 [facility] Patient Information 2015 [place], [place]. This information is not intended to replace advice given to you by your health care provider. Make sure you discuss any questions you have with your health care provider.

## 2014-08-25 NOTE — ED Notes (Signed)
Bed: WA18 Expected date:  Expected time:  Means of arrival:  Comments: EMS 

## 2014-08-28 NOTE — ED Provider Notes (Signed)
Medical screening examination/treatment/procedure(s) were performed by non-physician practitioner and as supervising physician I was immediately available for consultation/collaboration.   EKG Interpretation None       Virgel Manifold, MD 08/28/14 [phone]

## 2014-10-23 ENCOUNTER — Encounter (HOSPITAL_COMMUNITY): Payer: Self-pay | Admitting: Emergency Medicine

## 2014-11-21 ENCOUNTER — Encounter (HOSPITAL_COMMUNITY): Payer: Self-pay

## 2014-11-21 ENCOUNTER — Emergency Department (HOSPITAL_COMMUNITY): Payer: BC Managed Care – PPO

## 2014-11-21 ENCOUNTER — Emergency Department (HOSPITAL_COMMUNITY)
Admission: EM | Admit: 2014-11-21 | Discharge: 2014-11-22 | Disposition: A | Discharge status: Home / Self Care | Payer: BC Managed Care – PPO | Attending: Emergency Medicine | Admitting: Emergency Medicine

## 2014-11-21 ENCOUNTER — Telehealth (HOSPITAL_COMMUNITY): Payer: Self-pay | Admitting: Radiation Oncology

## 2014-11-21 DIAGNOSIS — R531 Weakness: Secondary | ICD-10-CM | POA: Diagnosis not present

## 2014-11-21 DIAGNOSIS — R55 Syncope and collapse: Secondary | ICD-10-CM | POA: Insufficient documentation

## 2014-11-21 DIAGNOSIS — W228XXA Striking against or struck by other objects, initial encounter: Secondary | ICD-10-CM | POA: Insufficient documentation

## 2014-11-21 DIAGNOSIS — F1010 Alcohol abuse, uncomplicated: Secondary | ICD-10-CM

## 2014-11-21 DIAGNOSIS — Z8659 Personal history of other mental and behavioral disorders: Secondary | ICD-10-CM | POA: Insufficient documentation

## 2014-11-21 DIAGNOSIS — Y9389 Activity, other specified: Secondary | ICD-10-CM | POA: Insufficient documentation

## 2014-11-21 DIAGNOSIS — Y998 Other external cause status: Secondary | ICD-10-CM | POA: Diagnosis not present

## 2014-11-21 DIAGNOSIS — Y92003 Bedroom of unspecified non-institutional (private) residence as the place of occurrence of the external cause: Secondary | ICD-10-CM | POA: Insufficient documentation

## 2014-11-21 DIAGNOSIS — W19XXXA Unspecified fall, initial encounter: Secondary | ICD-10-CM

## 2014-11-21 DIAGNOSIS — E669 Obesity, unspecified: Secondary | ICD-10-CM | POA: Insufficient documentation

## 2014-11-21 DIAGNOSIS — Z043 Encounter for examination and observation following other accident: Secondary | ICD-10-CM | POA: Diagnosis not present

## 2014-11-21 LAB — URINALYSIS, ROUTINE W REFLEX MICROSCOPIC
BILIRUBIN URINE: NEGATIVE
Glucose, UA: NEGATIVE mg/dL
KETONES UR: NEGATIVE mg/dL
Leukocytes, UA: NEGATIVE
Nitrite: NEGATIVE
Protein, ur: NEGATIVE mg/dL
SPECIFIC GRAVITY, URINE: 1.005 (ref 1.005–1.030)
UROBILINOGEN UA: 0.2 mg/dL (ref 0.0–1.0)
pH: 5 (ref 5.0–8.0)

## 2014-11-21 LAB — URINE MICROSCOPIC-ADD ON

## 2014-11-21 LAB — CBC WITH DIFFERENTIAL/PLATELET
Basophils Absolute: 0 10*3/uL (ref 0.0–0.1)
Basophils Relative: 0 % (ref 0–1)
EOS ABS: 0 10*3/uL (ref 0.0–0.7)
Eosinophils Relative: 0 % (ref 0–5)
HCT: 40.8 % (ref 36.0–46.0)
HEMOGLOBIN: 14.1 g/dL (ref 12.0–15.0)
LYMPHS ABS: 0.9 10*3/uL (ref 0.7–4.0)
Lymphocytes Relative: 10 % — ABNORMAL LOW (ref 12–46)
MCH: 31.5 pg (ref 26.0–34.0)
MCHC: 34.6 g/dL (ref 30.0–36.0)
MCV: 91.1 fL (ref 78.0–100.0)
MONOS PCT: 6 % (ref 3–12)
Monocytes Absolute: 0.5 10*3/uL (ref 0.1–1.0)
NEUTROS PCT: 84 % — AB (ref 43–77)
Neutro Abs: 7.1 10*3/uL (ref 1.7–7.7)
PLATELETS: 136 10*3/uL — AB (ref 150–400)
RBC: 4.48 MIL/uL (ref 3.87–5.11)
RDW: 16.4 % — ABNORMAL HIGH (ref 11.5–15.5)
WBC: 8.5 10*3/uL (ref 4.0–10.5)

## 2014-11-21 LAB — RAPID URINE DRUG SCREEN, HOSP PERFORMED
Amphetamines: NOT DETECTED
BARBITURATES: NOT DETECTED
Benzodiazepines: NOT DETECTED
COCAINE: NOT DETECTED
Opiates: NOT DETECTED
TETRAHYDROCANNABINOL: NOT DETECTED

## 2014-11-21 LAB — COMPREHENSIVE METABOLIC PANEL
ALK PHOS: 170 U/L — AB (ref 39–117)
ALT: 153 U/L — ABNORMAL HIGH (ref 0–35)
AST: 549 U/L — ABNORMAL HIGH (ref 0–37)
Albumin: 4.4 g/dL (ref 3.5–5.2)
Anion gap: 29 — ABNORMAL HIGH (ref 5–15)
BUN: 5 mg/dL — AB (ref 6–23)
CO2: 17 meq/L — AB (ref 19–32)
Calcium: 9.4 mg/dL (ref 8.4–10.5)
Chloride: 85 mEq/L — ABNORMAL LOW (ref 96–112)
Creatinine, Ser: 0.61 mg/dL (ref 0.50–1.10)
GFR calc non Af Amer: >90 mL/min (ref >90)
GLUCOSE: 93 mg/dL (ref 70–99)
POTASSIUM: 4.1 meq/L (ref 3.7–5.3)
SODIUM: 131 meq/L — AB (ref 137–147)
TOTAL PROTEIN: 8.6 g/dL — AB (ref 6.0–8.3)
Total Bilirubin: 0.9 mg/dL (ref 0.3–1.2)

## 2014-11-21 LAB — CK: Total CK: 715 U/L — ABNORMAL HIGH (ref 7–177)

## 2014-11-21 LAB — BASIC METABOLIC PANEL
Anion gap: 23 — ABNORMAL HIGH (ref 5–15)
BUN: 5 mg/dL — AB (ref 6–23)
CO2: 19 mEq/L (ref 19–32)
CREATININE: 0.57 mg/dL (ref 0.50–1.10)
Calcium: 9.1 mg/dL (ref 8.4–10.5)
Chloride: 93 mEq/L — ABNORMAL LOW (ref 96–112)
GFR calc Af Amer: >90 mL/min (ref >90)
GLUCOSE: 98 mg/dL (ref 70–99)
POTASSIUM: 4.2 meq/L (ref 3.7–5.3)
Sodium: 135 mEq/L — ABNORMAL LOW (ref 137–147)

## 2014-11-21 LAB — ETHANOL: Alcohol, Ethyl (B): 319 mg/dL — ABNORMAL HIGH (ref 0–11)

## 2014-11-21 MED ORDER — VITAMIN B-1 100 MG PO TABS
100.0000 mg | ORAL_TABLET | Freq: Every day | ORAL | Status: DC
  Administered 2014-11-21 – 2014-11-22 (×2): 100 mg via ORAL
  Filled 2014-11-21 (×2): qty 1

## 2014-11-21 MED ORDER — LORAZEPAM 1 MG PO TABS: 0.0000 mg | ORAL_TABLET | Freq: Two times a day (BID) | ORAL | Status: DC

## 2014-11-21 MED ORDER — SODIUM CHLORIDE 0.9 % IV BOLUS (SEPSIS)
1000.0000 mL | Freq: Once | INTRAVENOUS | Status: AC
  Administered 2014-11-21: 1000 mL via INTRAVENOUS

## 2014-11-21 MED ORDER — THIAMINE HCL 100 MG/ML IJ SOLN: 100.0000 mg | Freq: Every day | INTRAMUSCULAR | Status: DC

## 2014-11-21 MED ORDER — IBUPROFEN 400 MG PO TABS
400.0000 mg | ORAL_TABLET | Freq: Four times a day (QID) | ORAL | Status: DC | PRN
  Administered 2014-11-21 – 2014-11-22 (×2): 400 mg via ORAL
  Filled 2014-11-21 (×2): qty 1

## 2014-11-21 MED ORDER — FOLIC ACID 1 MG PO TABS
1.0000 mg | ORAL_TABLET | Freq: Every day | ORAL | Status: DC
Start: 2014-11-21 — End: 2014-11-22
  Administered 2014-11-21 – 2014-11-22 (×2): 1 mg via ORAL
  Filled 2014-11-21 (×2): qty 1

## 2014-11-21 MED ORDER — ADULT MULTIVITAMIN W/MINERALS CH
1.0000 | ORAL_TABLET | Freq: Every day | ORAL | Status: DC
  Administered 2014-11-21 – 2014-11-22 (×2): 1 via ORAL
  Filled 2014-11-21 (×2): qty 1

## 2014-11-21 MED ORDER — LORAZEPAM 1 MG PO TABS
0.0000 mg | ORAL_TABLET | Freq: Four times a day (QID) | ORAL | Status: DC
  Administered 2014-11-21: 2 mg via ORAL
  Filled 2014-11-21: qty 2

## 2014-11-21 NOTE — BH Assessment (Addendum)
Inpt recommended, no current [facility] beds available. Sent referral to the following appropriate facilities: [facility]- per Langley Gauss did not receive. Re faxed, d/c pending 12-2 [facility] - no beds but accepting referrals [place] -declined due to medical issues per Clarene Critchley.  Lear Ng, [facility] Triage Specialist 11/21/2014 7:42 PM

## 2014-11-21 NOTE — ED Notes (Signed)
Per EMS pt was last seen on Thanksgiving Day; Pt was found wedged between bed and wall on the floor; Pt was covered in feces and cats were licking her; Pt was found by family who last saw her on Thursday; Pt A&Ox3; pt has a case of beer within arms reach and it is believed that she has been drinking while lying on floor for the past few day; pt states last drink was Sunday around noon; pt states she wanted to stay on the floor with her cats; Pt appears child like and intoxicated; pt was taken to shower upon arrival to clean feces off; pt has bruise to right shoulder, right abdomen and left knee. No family on arrival present.

## 2014-11-21 NOTE — ED Provider Notes (Signed)
0800 - Care from Dr. Lita Mains. Awaiting TTS consult  TTS states plan for outpatient placement.  Evelina Bucy, MD 11/21/14 [phone]
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
## 2014-11-21 NOTE — ED Notes (Signed)
Pt states that she can not walk because her feet are tingling and until she has pain medication

## 2014-11-21 NOTE — BH Assessment (Signed)
Consulted with AC Letitia Libra and Ronnette Juniper whom is recommending inpatient treatment. Pt appropriate for a bed at [facility] once a bed becomes available. Pt needs to be referred to other facilities.   Shaune Pollack, MS, [place] Assessment Counselor

## 2014-11-21 NOTE — ED Notes (Signed)
Pt ambulated to bathroom and back to room with walker, without difficulty.

## 2014-11-21 NOTE — ED Notes (Signed)
Will do CIWA when pt is sober

## 2014-11-21 NOTE — ED Notes (Signed)
Pt resting, appears to be asleep, no needs at this time

## 2014-11-21 NOTE — ED Notes (Signed)
Spoke to tina at [facility]. She advises that there may be a bed for pt. But she needs to be able to ambulate at least 30 yards independently to come to [facility]. Pt is aware of this and is expressing a desire to quit drinking. She states she will try to ambulate.

## 2014-11-21 NOTE — BH Assessment (Signed)
Informed EDP Dr.Miller of recommendation for inpatient treatment.  Natalie Pollack, MS, [place] Assessment Counselor

## 2014-11-21 NOTE — ED Notes (Signed)
Warm blanket given, pt resting watching tv, no needs at this time
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
## 2014-11-21 NOTE — ED Notes (Signed)
Pt resting, watching tv, no needs at this time

## 2014-11-21 NOTE — ED Notes (Signed)
Pt having a telepsych at this time

## 2014-11-21 NOTE — BH Assessment (Signed)
Writer informed the TTS Therapist Pincus Sanes) of the consult.  The RN Richardson Landry) will place the Tele Psych machine in the room.  Dr. Lita Mains was with a patient and clinical consultation information could not be obtained from him.  Writer informed the Unit Secretary  Rise Paganini) that TTS will be assessing the patient.  The (TTS) Therapist Pincus Sanes will follow up with Dr. Lita Mains.

## 2014-11-22 NOTE — ED Notes (Signed)
Case manager called to arrange for pt a walker

## 2014-11-22 NOTE — ED Notes (Signed)
Patient becoming agitated as discharge time approaches. She is wanting to know where her shoes and shirt are. According to inventory of belongings pt did not come with shoes and shirt

## 2014-11-22 NOTE — Progress Notes (Signed)
Fuller Mandril, RN, BSN, [place] [phone]. Pt qualifies for DME rolling walker.DME rolling walker ordered through [place].  Melene Muller of [facility] notified to deliver to pt room (C-23).

## 2014-11-22 NOTE — ED Notes (Signed)
Pt walker has been delivered

## 2014-11-22 NOTE — Discharge Instructions (Signed)
Follow up with your family md  °

## 2014-11-22 NOTE — ED Notes (Signed)
Pt up to use phone to call her husband to take her home. She advises that he will be here around noon. Dr zammitt made aware

## 2014-11-22 NOTE — ED Provider Notes (Signed)
The pt no longer wants detox and is not suicidal or homicidal.  She will be discharged home  Maudry Diego, MD 11/22/14 1121

## 2014-11-22 NOTE — BH Assessment (Signed)
Per Kerry Dory @[facility]   patient info is under review.  Shaune Pollack, MS, [place] Assessment Counselor

## 2014-11-22 NOTE — ED Notes (Signed)
Patient has decided that she doesn't want to go to behavioral health. States she will just tell her husband not to buy her any more beer. States "i dont want anyone to see me walking on this walker". Offered repeatedly to assist pt in stopping drinking and possibly help with her depression. Pt states she has a history of depression and has not been on meds for the past 3 years.

## 2014-12-17 ENCOUNTER — Encounter (HOSPITAL_COMMUNITY): Payer: Self-pay | Admitting: Emergency Medicine

## 2014-12-17 ENCOUNTER — Emergency Department (HOSPITAL_COMMUNITY)
Admission: EM | Admit: 2014-12-17 | Discharge: 2014-12-17 | Discharge status: Against Medical Advice | Payer: BC Managed Care – PPO | Attending: Emergency Medicine | Admitting: Emergency Medicine

## 2014-12-17 DIAGNOSIS — Y998 Other external cause status: Secondary | ICD-10-CM | POA: Insufficient documentation

## 2014-12-17 DIAGNOSIS — F1010 Alcohol abuse, uncomplicated: Secondary | ICD-10-CM | POA: Insufficient documentation

## 2014-12-17 DIAGNOSIS — Y939 Activity, unspecified: Secondary | ICD-10-CM | POA: Diagnosis not present

## 2014-12-17 DIAGNOSIS — E669 Obesity, unspecified: Secondary | ICD-10-CM | POA: Diagnosis not present

## 2014-12-17 DIAGNOSIS — Z043 Encounter for examination and observation following other accident: Secondary | ICD-10-CM | POA: Diagnosis not present

## 2014-12-17 DIAGNOSIS — W1839XA Other fall on same level, initial encounter: Secondary | ICD-10-CM | POA: Insufficient documentation

## 2014-12-17 DIAGNOSIS — Y929 Unspecified place or not applicable: Secondary | ICD-10-CM | POA: Insufficient documentation

## 2014-12-17 NOTE — ED Notes (Signed)
Pt from home with c/o fall today. Denies LOC. ETOH wants detox. States that she has drank 36 beers today.

## 2014-12-18 ENCOUNTER — Encounter (HOSPITAL_COMMUNITY): Payer: Self-pay | Admitting: Emergency Medicine

## 2014-12-18 ENCOUNTER — Emergency Department (HOSPITAL_COMMUNITY): Payer: BC Managed Care – PPO

## 2014-12-18 ENCOUNTER — Emergency Department (HOSPITAL_COMMUNITY)
Admission: EM | Admit: 2014-12-18 | Discharge: 2014-12-19 | Disposition: A | Discharge status: Home / Self Care | Payer: BC Managed Care – PPO | Attending: Emergency Medicine | Admitting: Emergency Medicine

## 2014-12-18 DIAGNOSIS — Z8659 Personal history of other mental and behavioral disorders: Secondary | ICD-10-CM | POA: Diagnosis not present

## 2014-12-18 DIAGNOSIS — E669 Obesity, unspecified: Secondary | ICD-10-CM | POA: Insufficient documentation

## 2014-12-18 DIAGNOSIS — Y9389 Activity, other specified: Secondary | ICD-10-CM | POA: Insufficient documentation

## 2014-12-18 DIAGNOSIS — S0990XA Unspecified injury of head, initial encounter: Secondary | ICD-10-CM | POA: Insufficient documentation

## 2014-12-18 DIAGNOSIS — Y9289 Other specified places as the place of occurrence of the external cause: Secondary | ICD-10-CM | POA: Diagnosis not present

## 2014-12-18 DIAGNOSIS — F1010 Alcohol abuse, uncomplicated: Secondary | ICD-10-CM | POA: Diagnosis present

## 2014-12-18 DIAGNOSIS — F10120 Alcohol abuse with intoxication, uncomplicated: Secondary | ICD-10-CM | POA: Insufficient documentation

## 2014-12-18 DIAGNOSIS — W108XXA Fall (on) (from) other stairs and steps, initial encounter: Secondary | ICD-10-CM | POA: Diagnosis not present

## 2014-12-18 DIAGNOSIS — W19XXXA Unspecified fall, initial encounter: Secondary | ICD-10-CM

## 2014-12-18 DIAGNOSIS — F10920 Alcohol use, unspecified with intoxication, uncomplicated: Secondary | ICD-10-CM

## 2014-12-18 DIAGNOSIS — Y998 Other external cause status: Secondary | ICD-10-CM | POA: Diagnosis not present

## 2014-12-18 LAB — CBC
HEMATOCRIT: 42.3 % (ref 36.0–46.0)
Hemoglobin: 14.4 g/dL (ref 12.0–15.0)
MCH: 32.4 pg (ref 26.0–34.0)
MCHC: 34 g/dL (ref 30.0–36.0)
MCV: 95.1 fL (ref 78.0–100.0)
Platelets: 146 10*3/uL — ABNORMAL LOW (ref 150–400)
RBC: 4.45 MIL/uL (ref 3.87–5.11)
RDW: 14.5 % (ref 11.5–15.5)
WBC: 5.2 10*3/uL (ref 4.0–10.5)

## 2014-12-18 LAB — COMPREHENSIVE METABOLIC PANEL
ALBUMIN: 4.6 g/dL (ref 3.5–5.2)
ALT: 48 U/L — ABNORMAL HIGH (ref 0–35)
ANION GAP: 16 — AB (ref 5–15)
AST: 97 U/L — ABNORMAL HIGH (ref 0–37)
Alkaline Phosphatase: 97 U/L (ref 39–117)
CO2: 25 mmol/L (ref 19–32)
CREATININE: 0.5 mg/dL (ref 0.50–1.10)
Calcium: 9.1 mg/dL (ref 8.4–10.5)
Chloride: 97 mEq/L (ref 96–112)
GFR calc non Af Amer: >90 mL/min (ref >90)
GLUCOSE: 94 mg/dL (ref 70–99)
Potassium: 3.9 mmol/L (ref 3.5–5.1)
Sodium: 138 mmol/L (ref 135–145)
TOTAL PROTEIN: 8.2 g/dL (ref 6.0–8.3)
Total Bilirubin: 0.6 mg/dL (ref 0.3–1.2)

## 2014-12-18 LAB — RAPID URINE DRUG SCREEN, HOSP PERFORMED
Amphetamines: NOT DETECTED
Barbiturates: NOT DETECTED
Benzodiazepines: NOT DETECTED
COCAINE: NOT DETECTED
OPIATES: NOT DETECTED
Tetrahydrocannabinol: NOT DETECTED

## 2014-12-18 LAB — SALICYLATE LEVEL: Salicylate Lvl: <4 mg/dL (ref 2.8–20.0)

## 2014-12-18 LAB — ACETAMINOPHEN LEVEL: Acetaminophen (Tylenol), Serum: <10 ug/mL — ABNORMAL LOW (ref 10–30)

## 2014-12-18 LAB — ETHANOL: ALCOHOL ETHYL (B): 410 mg/dL — AB (ref 0–9)

## 2014-12-18 MED ORDER — LORAZEPAM 2 MG/ML IJ SOLN: 0.0000 mg | Freq: Two times a day (BID) | INTRAMUSCULAR | Status: DC

## 2014-12-18 MED ORDER — THIAMINE HCL 100 MG/ML IJ SOLN: 100.0000 mg | Freq: Every day | INTRAMUSCULAR | Status: DC

## 2014-12-18 MED ORDER — LORAZEPAM 2 MG/ML IJ SOLN: 0.0000 mg | Freq: Four times a day (QID) | INTRAMUSCULAR | Status: DC

## 2014-12-18 MED ORDER — ONDANSETRON HCL 4 MG PO TABS
4.0000 mg | ORAL_TABLET | Freq: Three times a day (TID) | ORAL | Status: DC | PRN
  Administered 2014-12-19: 4 mg via ORAL
  Filled 2014-12-18: qty 1

## 2014-12-18 MED ORDER — IBUPROFEN 200 MG PO TABS: 600.0000 mg | ORAL_TABLET | Freq: Three times a day (TID) | ORAL | Status: DC | PRN

## 2014-12-18 MED ORDER — LORAZEPAM 1 MG PO TABS
0.0000 mg | ORAL_TABLET | Freq: Two times a day (BID) | ORAL | Status: DC
Start: 2014-12-21 — End: 2014-12-19

## 2014-12-18 MED ORDER — ZOLPIDEM TARTRATE 5 MG PO TABS: 5.0000 mg | ORAL_TABLET | Freq: Every evening | ORAL | Status: DC | PRN

## 2014-12-18 MED ORDER — VITAMIN B-1 100 MG PO TABS
100.0000 mg | ORAL_TABLET | Freq: Every day | ORAL | Status: DC
  Administered 2014-12-19: 100 mg via ORAL
  Filled 2014-12-18: qty 1

## 2014-12-18 MED ORDER — ACETAMINOPHEN 325 MG PO TABS: 650.0000 mg | ORAL_TABLET | ORAL | Status: DC | PRN

## 2014-12-18 MED ORDER — NICOTINE 21 MG/24HR TD PT24: 21.0000 mg | MEDICATED_PATCH | Freq: Every day | TRANSDERMAL | Status: DC

## 2014-12-18 MED ORDER — VITAMIN B-1 100 MG PO TABS: 100.0000 mg | ORAL_TABLET | Freq: Every day | ORAL | Status: DC

## 2014-12-18 MED ORDER — ALUM & MAG HYDROXIDE-SIMETH 200-200-20 MG/5ML PO SUSP: 30.0000 mL | ORAL | Status: DC | PRN

## 2014-12-18 MED ORDER — LORAZEPAM 1 MG PO TABS
0.0000 mg | ORAL_TABLET | Freq: Four times a day (QID) | ORAL | Status: DC
  Filled 2014-12-18: qty 1
  Filled 2014-12-18: qty 2

## 2014-12-18 MED ORDER — LORAZEPAM 1 MG PO TABS: 0.0000 mg | ORAL_TABLET | Freq: Two times a day (BID) | ORAL | Status: DC

## 2014-12-18 MED ORDER — LORAZEPAM 1 MG PO TABS
0.0000 mg | ORAL_TABLET | Freq: Four times a day (QID) | ORAL | Status: DC
  Administered 2014-12-19: 1 mg via ORAL
  Administered 2014-12-19: 2 mg via ORAL

## 2014-12-18 MED ORDER — LORAZEPAM 1 MG PO TABS
1.0000 mg | ORAL_TABLET | Freq: Three times a day (TID) | ORAL | Status: DC | PRN
Start: 2014-12-18 — End: 2014-12-19

## 2014-12-18 NOTE — ED Notes (Signed)
One pt belonging bag and one purse placed in locker 31
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
## 2014-12-18 NOTE — ED Notes (Signed)
Pt arrived to the ED with a complaint of alcohol intoxication and a fall.  Pt states that she falls often after she stops drinking.  Pt arrived after her daughter called and stated she had fell.  Pt states she has a problem with alcohol and would like some help

## 2014-12-19 ENCOUNTER — Inpatient Hospital Stay (HOSPITAL_COMMUNITY)
Admission: AD | Admit: 2014-12-19 | Discharge: 2014-12-25 | DRG: 897 | Disposition: A | Discharge status: Home / Self Care | Payer: Federal, State, Local not specified - Other | Source: Intra-hospital | Attending: Psychiatry | Admitting: Psychiatry

## 2014-12-19 ENCOUNTER — Encounter (HOSPITAL_COMMUNITY): Payer: Self-pay | Admitting: *Deleted

## 2014-12-19 DIAGNOSIS — Z62810 Personal history of physical and sexual abuse in childhood: Secondary | ICD-10-CM | POA: Diagnosis present

## 2014-12-19 DIAGNOSIS — F1994 Other psychoactive substance use, unspecified with psychoactive substance-induced mood disorder: Secondary | ICD-10-CM | POA: Diagnosis present

## 2014-12-19 DIAGNOSIS — G5790 Unspecified mononeuropathy of unspecified lower limb: Secondary | ICD-10-CM | POA: Diagnosis present

## 2014-12-19 DIAGNOSIS — F410 Panic disorder [episodic paroxysmal anxiety] without agoraphobia: Secondary | ICD-10-CM | POA: Diagnosis present

## 2014-12-19 DIAGNOSIS — Y908 Blood alcohol level of 240 mg/100 ml or more: Secondary | ICD-10-CM | POA: Diagnosis present

## 2014-12-19 DIAGNOSIS — G629 Polyneuropathy, unspecified: Secondary | ICD-10-CM

## 2014-12-19 DIAGNOSIS — F1020 Alcohol dependence, uncomplicated: Principal | ICD-10-CM | POA: Diagnosis present

## 2014-12-19 DIAGNOSIS — Z8249 Family history of ischemic heart disease and other diseases of the circulatory system: Secondary | ICD-10-CM

## 2014-12-19 DIAGNOSIS — F1099 Alcohol use, unspecified with unspecified alcohol-induced disorder: Secondary | ICD-10-CM

## 2014-12-19 DIAGNOSIS — F1010 Alcohol abuse, uncomplicated: Secondary | ICD-10-CM | POA: Diagnosis present

## 2014-12-19 DIAGNOSIS — F10120 Alcohol abuse with intoxication, uncomplicated: Secondary | ICD-10-CM | POA: Diagnosis not present

## 2014-12-19 DIAGNOSIS — Z9884 Bariatric surgery status: Secondary | ICD-10-CM

## 2014-12-19 DIAGNOSIS — F332 Major depressive disorder, recurrent severe without psychotic features: Secondary | ICD-10-CM | POA: Diagnosis present

## 2014-12-19 DIAGNOSIS — F329 Major depressive disorder, single episode, unspecified: Secondary | ICD-10-CM

## 2014-12-19 DIAGNOSIS — F10929 Alcohol use, unspecified with intoxication, unspecified: Secondary | ICD-10-CM | POA: Insufficient documentation

## 2014-12-19 DIAGNOSIS — F10230 Alcohol dependence with withdrawal, uncomplicated: Secondary | ICD-10-CM | POA: Insufficient documentation

## 2014-12-19 MED ORDER — LORAZEPAM 1 MG PO TABS
0.0000 mg | ORAL_TABLET | Freq: Four times a day (QID) | ORAL | Status: DC
  Administered 2014-12-19: 2 mg via ORAL
  Administered 2014-12-20 (×3): 1 mg via ORAL
  Filled 2014-12-19: qty 1
  Filled 2014-12-19: qty 2
  Filled 2014-12-19: qty 1

## 2014-12-19 MED ORDER — TRAZODONE HCL 50 MG PO TABS
50.0000 mg | ORAL_TABLET | Freq: Every day | ORAL | Status: DC
  Administered 2014-12-19 – 2014-12-21 (×3): 50 mg via ORAL
  Filled 2014-12-19 (×7): qty 1
  Filled 2014-12-19: qty 14
  Filled 2014-12-19: qty 1

## 2014-12-19 MED ORDER — LORAZEPAM 2 MG/ML IJ SOLN: 0.0000 mg | Freq: Four times a day (QID) | INTRAMUSCULAR | Status: DC

## 2014-12-19 MED ORDER — ACETAMINOPHEN 325 MG PO TABS
650.0000 mg | ORAL_TABLET | Freq: Four times a day (QID) | ORAL | Status: DC | PRN
  Administered 2014-12-19 – 2014-12-21 (×3): 650 mg via ORAL
  Filled 2014-12-19 (×4): qty 2

## 2014-12-19 MED ORDER — LORAZEPAM 1 MG PO TABS: 0.0000 mg | ORAL_TABLET | Freq: Two times a day (BID) | ORAL | Status: DC

## 2014-12-19 MED ORDER — THIAMINE HCL 100 MG/ML IJ SOLN: 100.0000 mg | Freq: Every day | INTRAMUSCULAR | Status: DC

## 2014-12-19 MED ORDER — VITAMIN B-1 100 MG PO TABS: 100.0000 mg | ORAL_TABLET | Freq: Every day | ORAL | Status: DC

## 2014-12-19 MED ORDER — VITAMIN B-1 100 MG PO TABS
100.0000 mg | ORAL_TABLET | Freq: Every day | ORAL | Status: DC
  Administered 2014-12-20 – 2014-12-25 (×6): 100 mg via ORAL
  Filled 2014-12-19 (×9): qty 1

## 2014-12-19 MED ORDER — HYDROXYZINE HCL 25 MG PO TABS: 25.0000 mg | ORAL_TABLET | Freq: Four times a day (QID) | ORAL | Status: AC | PRN

## 2014-12-19 MED ORDER — ADULT MULTIVITAMIN W/MINERALS CH
1.0000 | ORAL_TABLET | Freq: Every day | ORAL | Status: DC
  Administered 2014-12-20 – 2014-12-25 (×6): 1 via ORAL
  Filled 2014-12-19 (×9): qty 1

## 2014-12-19 MED ORDER — MAGNESIUM HYDROXIDE 400 MG/5ML PO SUSP: 30.0000 mL | Freq: Every day | ORAL | Status: DC | PRN

## 2014-12-19 MED ORDER — ALUM & MAG HYDROXIDE-SIMETH 200-200-20 MG/5ML PO SUSP: 30.0000 mL | ORAL | Status: DC | PRN

## 2014-12-19 MED ORDER — LORAZEPAM 1 MG PO TABS: 0.0000 mg | ORAL_TABLET | Freq: Four times a day (QID) | ORAL | Status: DC

## 2014-12-19 MED ORDER — LORAZEPAM 2 MG/ML IJ SOLN: 0.0000 mg | Freq: Two times a day (BID) | INTRAMUSCULAR | Status: DC

## 2014-12-19 MED ORDER — ONDANSETRON 4 MG PO TBDP: 4.0000 mg | ORAL_TABLET | Freq: Four times a day (QID) | ORAL | Status: DC | PRN

## 2014-12-19 MED ORDER — LORAZEPAM 1 MG PO TABS: 1.0000 mg | ORAL_TABLET | Freq: Four times a day (QID) | ORAL | Status: AC | PRN

## 2014-12-19 MED ORDER — LOPERAMIDE HCL 2 MG PO CAPS
2.0000 mg | ORAL_CAPSULE | ORAL | Status: AC | PRN
  Administered 2014-12-21: 4 mg via ORAL
  Filled 2014-12-19: qty 2

## 2014-12-19 MED ORDER — LORAZEPAM 1 MG PO TABS
0.0000 mg | ORAL_TABLET | Freq: Two times a day (BID) | ORAL | Status: DC
  Filled 2014-12-19: qty 1

## 2014-12-19 NOTE — BHH Counselor (Signed)
It is recommended Per Dr. Darleene Cleaver and Charmaine Downs NP that pt be placed inpatient. TTS is finding placement   Bedelia Person, M.S., LPCA, [facility] Licensed Professional Counselor Associate  Triage Specialist  [facility]  Therapeutic Triage Services Phone: [phone] Fax: [phone]

## 2014-12-19 NOTE — ED Notes (Signed)
[facility] [phone]) called for pt transport to behavioral health. ETA 4:15pm

## 2014-12-19 NOTE — Progress Notes (Signed)
Writer introduced self to pt. Pt stated that she is here for alcohol detox and the last time she had a drink was Monday. 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 Pt smells like alcohol on approach and writer encouraged pt to shower. Pt agreed to shower and toiletries given to pt.  Pt has wheelchair in reached.

## 2014-12-19 NOTE — ED Notes (Signed)
Pt had an outburst, stating she was ready to go home, wanted to have her husband her to pick her up. Pt threw pitcher of ice water out of the door.

## 2014-12-19 NOTE — BHH Group Notes (Signed)
Adult Psychoeducational Group Note  Date:  12/19/2014 Time:  9:49 PM  Group Topic/Focus:  Wrap-Up Group:   The focus of this group is to help patients review their daily goal of treatment and discuss progress on daily workbooks.  Participation Level:  Minimal  Participation Quality:  Appropriate  Affect:  Flat  Cognitive:  Alert, Appropriate and Oriented  Insight: Limited  Engagement in Group:  Developing/Improving  Modes of Intervention:  Discussion and Support  Additional Comments:  Pt rated her day a 1 out of 10 due to being in pain and feeling as though she is too young to be using a walker or wheelchair but continuing to state " but you do what you got to do."   Natalie Mcdaniel 12/19/2014, 9:49 PM

## 2014-12-19 NOTE — Progress Notes (Signed)
Patient ID: Natalie Mcdaniel, female   DOB: 07-20-1966, 48 y.o.   MRN: [id number] D: Patient alert and cooperative. Pt detoxing from [place] and c/o tremors, visual hallucinations seeing dark spots. Pt c/o pain and neuropathy and is unsteady on her feet. Pt mood/affect is anxious and sad.   A: Medications administered as prescribed. Pt encouraged to use wheelchair for mobility. Emotional support given and will continue to monitor pt's progress for stabilization.  R: Patient remains safe and complaint with medications. Pt using wheelchair. Pt reports she "feels a lot better than she came in". Pt denies SI/HI/AH.

## 2014-12-19 NOTE — ED Notes (Signed)
TTS in room.  

## 2014-12-19 NOTE — Progress Notes (Signed)
Patient ID: Natalie Mcdaniel, female   DOB: 11/14/1966, 48 y.o.   MRN: [id number] Nursing Admission note:  Patient is a 48 yo female admitted to the [facility] after daughter insisted she come in for her continued alcohol abuse.  She reports drinking 48 can of beer a day.  Patient is in a wheelchair due to severe pain in her feet.  She reports severe pain and neuropathy.  She denies any major medical issues.  She has had gastric bypass surgery.  Patient reports blackouts from drinking and depression.  She denies any SI/HI/AVH.  Patient states she started drinking when she was 14 years of ago and then stopped for 30 years.  She reports losing her job in April 2015 and she started drinking heavily.  She states her husband and daughter are upset over her drinking and want her to get help.  Her BAL in the ED was 410.  She denies any other drug use.  Patient has had prior admission 4 years ago.  Patient was oriented to room and unit.

## 2014-12-19 NOTE — ED Notes (Signed)
Patient is resting comfortably. 

## 2014-12-19 NOTE — BH Assessment (Addendum)
[facility] Assessment Progress Note  Pt has been accepted to [facility] by Corena Pilgrim, MD.  Per Letitia Libra, RN, [facility], pt has been assigned to Rm 302-2.  Pt has signed Voluntary Admission and Consent for Treatment, as well as Consent to Release Information, and signed forms have been faxed to [facility].  Pt's nurse has been notified.  She agrees to send original paperwork with pt via [facility], and to call report to [phone].  Jalene Mullet, MA Triage Specialist 12/19/2014 @ 15:24

## 2014-12-19 NOTE — Tx Team (Signed)
Initial Interdisciplinary Treatment Plan   PATIENT STRESSORS: Health problems Medication change or noncompliance Substance abuse   PATIENT STRENGTHS: Average or above average intelligence Capable of independent living Motivation for treatment/growth Supportive family/friends   PROBLEM LIST: Problem List/Patient Goals Date to be addressed Date deferred Reason deferred Estimated date of resolution  Alcohol abuse 12/19/14     Limited resources 12/19/14     Poor support system 12/19/14                                          DISCHARGE CRITERIA:  Ability to meet basic life and health needs Improved stabilization in mood, thinking, and/or behavior Withdrawal symptoms are absent or subacute and managed without 24-hour nursing intervention  PRELIMINARY DISCHARGE PLAN: Attend 12-step recovery group Return to previous living arrangement  PATIENT/FAMIILY INVOLVEMENT: This treatment plan has been presented to and reviewed with the patient, ILYNN STAUFFER, and/or family member.  The patient and family have been given the opportunity to ask questions and make suggestions.  Zipporah Plants 12/19/2014, 6:09 PM

## 2014-12-19 NOTE — ED Notes (Signed)
Bed: WS39 Expected date:  Expected time:  Means of arrival:  Comments: Room 31
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
## 2014-12-20 ENCOUNTER — Encounter (HOSPITAL_COMMUNITY): Payer: Self-pay | Admitting: Psychiatry

## 2014-12-20 DIAGNOSIS — F332 Major depressive disorder, recurrent severe without psychotic features: Secondary | ICD-10-CM | POA: Diagnosis present

## 2014-12-20 DIAGNOSIS — F1099 Alcohol use, unspecified with unspecified alcohol-induced disorder: Secondary | ICD-10-CM

## 2014-12-20 DIAGNOSIS — F1994 Other psychoactive substance use, unspecified with psychoactive substance-induced mood disorder: Secondary | ICD-10-CM | POA: Diagnosis present

## 2014-12-20 DIAGNOSIS — G629 Polyneuropathy, unspecified: Secondary | ICD-10-CM

## 2014-12-20 MED ORDER — LORAZEPAM 1 MG PO TABS
1.0000 mg | ORAL_TABLET | Freq: Every day | ORAL | Status: AC
  Administered 2014-12-24: 1 mg via ORAL
  Filled 2014-12-20: qty 1

## 2014-12-20 MED ORDER — LORAZEPAM 1 MG PO TABS
1.0000 mg | ORAL_TABLET | Freq: Four times a day (QID) | ORAL | Status: AC
  Administered 2014-12-20 – 2014-12-21 (×5): 1 mg via ORAL
  Filled 2014-12-20 (×7): qty 1

## 2014-12-20 MED ORDER — LORAZEPAM 1 MG PO TABS
1.0000 mg | ORAL_TABLET | Freq: Two times a day (BID) | ORAL | Status: AC
  Administered 2014-12-23 (×2): 1 mg via ORAL
  Filled 2014-12-20 (×2): qty 1

## 2014-12-20 MED ORDER — LORAZEPAM 1 MG PO TABS
1.0000 mg | ORAL_TABLET | Freq: Three times a day (TID) | ORAL | Status: AC
  Administered 2014-12-22 (×3): 1 mg via ORAL
  Filled 2014-12-20: qty 1

## 2014-12-20 MED ORDER — ONDANSETRON 4 MG PO TBDP: 4.0000 mg | ORAL_TABLET | Freq: Four times a day (QID) | ORAL | Status: DC | PRN

## 2014-12-20 NOTE — Progress Notes (Signed)
D: Pt presents with flat affect and depressed mood.  Pt reports withdrawal s/s of chills, sweats, tremors and body aches. Pt reported that she was able to sleep well last night. Pt verbalized feeling depressed d/t wanting to be back at home. Pt gait unsteady d/t foot neuropathy. Pt encouraged to use wheelchair. Pt compliant this morning with attending groups and taking meds.  A: Medications administered as ordered per MD.  Verbal support given. Pt encouraged to attend groups. 15 minute checks performed for safety.  R: Pt receptive to treatment.

## 2014-12-20 NOTE — Plan of Care (Signed)
Problem: Ineffective individual coping Goal: STG: Patient will remain free from self harm Outcome: Progressing Pt is safe and free of self harm.

## 2014-12-20 NOTE — BHH Group Notes (Signed)
   [facility] LCSW Aftercare Discharge Planning Group Note  12/20/2014  8:45 AM   Participation Quality: Alert, Appropriate and Oriented  Mood/Affect: Depressed and Flat  Depression Rating: 9  Anxiety Rating: 0  Thoughts of Suicide: Pt denies SI/HI  Will you contract for safety? Yes  Current AVH: Pt denies  Plan for Discharge/Comments: Pt attended discharge planning group and actively participated in group. CSW provided pt with today's workbook. Patient reports feeling depressed today. She reports being hospitalized due to alcohol abuse and falling down. She reports that she misses her cats. Patient denies interest in residential treatment.  Transportation Means: Pt reports access to transportation  Supports: Patient identified her family as supportive.  Tilden Fossa, MSW, [place] Worker [facility] [phone]

## 2014-12-20 NOTE — Progress Notes (Signed)
Patient ID: Natalie Mcdaniel, female   DOB: 1966-02-10, 48 y.o.   MRN: [id number] D: Patient alert and cooperative. Pt is high fall risk c/o neuropathy. Pt mood and affect appeared sad and depressed. Pt reports minimal withdrawal symptoms.   A: Medications administered as prescribed. Pt encouraged to use wheelchair for mobility. Emotional support given and will continue to monitor pt's progress for stabilization.  R: Patient remains safe and complaint with medications. Pt using wheelchair. Pt denies SI/HI/AH.
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
## 2014-12-20 NOTE — BHH Group Notes (Addendum)
[place] LCSW Group Therapy 12/20/2014  1:15 PM Type of Therapy: Group Therapy Participation Level: Active  Participation Quality: Attentive, Sharing and Supportive  Affect: Depressed and Flat  Cognitive: Alert and Oriented  Insight: Developing/Improving and Engaged  Engagement in Therapy: Developing/Improving and Engaged  Modes of Intervention: Clarification, Confrontation, Discussion, Education, Exploration, Limit-setting, Orientation, Problem-solving, Rapport Building, Art therapist, Socialization and Support  Summary of Progress/Problems: The topic for group today was emotional regulation. This group focused on both positive and negative emotion identification and allowed group members to process ways to identify feelings, regulate negative emotions, and find healthy ways to manage internal/external emotions. Group members were asked to reflect on a time when their reaction to an emotion led to a negative outcome and explored how alternative responses using emotion regulation would have benefited them. Group members were also asked to discuss a time when emotion regulation was utilized when a negative emotion was experienced. Patient discussed feelings of regret, stating "I've lost a lot in my life- I've almost lost my family and I've lost 2 jobs." Patient shared that she had 30 years of sobriety prior to her relapse in 2014. CSW's and other group members provided emotional support and encouragement.  Tilden Fossa, MSW, [place] Worker [facility] [phone]

## 2014-12-20 NOTE — BHH Group Notes (Signed)
Adult Psychoeducational Group Note  Date:  12/20/2014 Time:  9:59 PM  Group Topic/Focus:  Wrap-Up Group:   The focus of this group is to help patients review their daily goal of treatment and discuss progress on daily workbooks.  Participation Level:  Active  Additional Comments:  Tonight's group was NA group and this pt did attend.   Lavinia Sharps P 12/20/2014, 9:59 PM

## 2014-12-21 DIAGNOSIS — F1020 Alcohol dependence, uncomplicated: Secondary | ICD-10-CM | POA: Insufficient documentation

## 2014-12-21 DIAGNOSIS — F10230 Alcohol dependence with withdrawal, uncomplicated: Secondary | ICD-10-CM | POA: Insufficient documentation

## 2014-12-21 MED ORDER — DULOXETINE HCL 20 MG PO CPEP
20.0000 mg | ORAL_CAPSULE | Freq: Every day | ORAL | Status: DC
  Administered 2014-12-21 – 2014-12-25 (×5): 20 mg via ORAL
  Filled 2014-12-21 (×6): qty 1

## 2014-12-21 MED ORDER — GABAPENTIN 100 MG PO CAPS
100.0000 mg | ORAL_CAPSULE | Freq: Three times a day (TID) | ORAL | Status: DC
  Administered 2014-12-21 – 2014-12-25 (×13): 100 mg via ORAL
  Filled 2014-12-21 (×4): qty 1
  Filled 2014-12-21: qty 42
  Filled 2014-12-21 (×3): qty 1
  Filled 2014-12-21: qty 42
  Filled 2014-12-21 (×4): qty 1
  Filled 2014-12-21: qty 42
  Filled 2014-12-21 (×4): qty 1
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
## 2014-12-21 NOTE — Clinical Social Work Note (Signed)
CSW spoke with patient's husband who reports that he feels that patient needs residential treatment and does not want her to come home without it. CSW informed husband that patient is not interested in residential treatment at this time but advised him to provide encouragement to patient regarding going to residential treatment. Husband verbalized his understanding. CSW informed him that patient will likely discharge over the weekend or Monday. He reports that he and patient's daughter are planning to visit patient this evening.   Tilden Fossa, MSW, [place] Worker [facility] [phone]

## 2014-12-21 NOTE — Progress Notes (Signed)
Patient ID: Natalie Mcdaniel, female   DOB: [birth year], 48 y.o.   MRN: [id number] D: Patient alert and cooperative. Pt report her husband came to visit but disappointed he could not eat dinner with her. Pt report having 8 loose stool today but did not report it. Pt seemed a lot in her speech and interacting with peers. Pt reports minimal withdrawal symptoms.   A: Medications administered as prescribed. Emotional support given and encouraged to report any diarrhea.  R: Patient remains safe and complaint with medications. Pt using wheelchair. Pt denies SI/HI/AH.

## 2014-12-21 NOTE — BHH Suicide Risk Assessment (Signed)
[place] INPATIENT:  Family/Significant Other Suicide Prevention Education  Suicide Prevention Education:  Education Completed; husband Natalie Mcdaniel [phone],  (name of family member/significant other) has been identified by the patient as the family member/significant other with whom the patient will be residing, and identified as the person(s) who will aid the patient in the event of a mental health crisis (suicidal ideations/suicide attempt).  With written consent from the patient, the family member/significant other has been provided the following suicide prevention education, prior to the and/or following the discharge of the patient.  The suicide prevention education provided includes the following:  Suicide risk factors  Suicide prevention and interventions  National Suicide Hotline telephone number  [facility] assessment telephone number  [facility] Emergency Assistance [place] and/or Residential Mobile Crisis Unit telephone number  Request made of family/significant other to:  Remove weapons (e.g., guns, rifles, knives), all items previously/currently identified as safety concern.    Remove drugs/medications (over-the-counter, prescriptions, illicit drugs), all items previously/currently identified as a safety concern.  The family member/significant other verbalizes understanding of the suicide prevention education information provided.  The family member/significant other agrees to remove the items of safety concern listed above.  Chastin Garlitz, Casimiro Needle 12/21/2014, 11:13 AM

## 2014-12-21 NOTE — Progress Notes (Signed)
Patient up and visible in the milieu this morning. Ambulating via wheelchair. Complaining about chronic foot pain bilaterally. States it is always at a 9-10/10. Patient is flat in affect with depressed mood. Forwards little information though is cooperative. Medicated per orders and med education provided. Given tylenol for pain. Supported and encouraged. Fall precautions reviewed. Patient verbalized understanding. On reassess of pain patient states it remains at a 9/10. Patient will speak with provider about additional pain management options. She denies SI/HI and remains safe. Jamie Kato

## 2014-12-21 NOTE — BHH Group Notes (Signed)
Adult Psychoeducational Group Note  Date:  12/21/2014 Time:  10:05 PM  Group Topic/Focus:  Wrap-Up Group:   The focus of this group is to help patients review their daily goal of treatment and discuss progress on daily workbooks.  Participation Level:  Active  Participation Quality:  Appropriate  Affect:  Flat  Cognitive:  Appropriate  Insight: Good  Engagement in Group:  Engaged  Modes of Intervention:  Discussion  Additional Comments:  Natalie Mcdaniel stated her day was wonderful.  She expressed her husband came to visit.  She has discharge plans as of yet but she is looking forward to spending time with her grandchildren and staying sober.  [name] A 12/21/2014, 10:05 PM

## 2014-12-21 NOTE — Plan of Care (Signed)
Problem: Diagnosis: Increased Risk For Suicide Attempt Goal: STG-Patient Will Comply With Medication Regime Outcome: Progressing Pt is safe and compliant with prescribed medication regimen

## 2014-12-21 NOTE — BHH Group Notes (Signed)
[place] LCSW Group Therapy 12/21/2014 1:15 PM Type of Therapy: Group Therapy Participation Level: Active  Participation Quality: Attentive, Sharing and Supportive  Affect: Depressed and Flat  Cognitive: Alert and Oriented  Insight: Developing/Improving and Engaged  Engagement in Therapy: Developing/Improving and Engaged  Modes of Intervention: Activity, Clarification, Confrontation, Discussion, Education, Exploration, Limit-setting, Orientation, Problem-solving, Rapport Building, Art therapist, Socialization and Support  Summary of Progress/Problems: Patient was attentive and engaged with speaker from [place]. Patient was attentive to speaker while they shared their story of dealing with mental health and overcoming it. Patient expressed interest in their programs and services and received information on their agency. Patient processed ways they can relate to the speaker.   Tilden Fossa, MSW, [place] Worker [facility] [phone]

## 2014-12-21 NOTE — BHH Group Notes (Signed)
[facility] Group Notes:  (Nursing/MHT/Case Management/Adjunct)  Date:  12/21/2014  Time:  9:00am  Type of Therapy:  Nurse Education  Participation Level:  Minimal  Participation Quality:  Appropriate  Affect:  Flat  Cognitive:  Alert  Insight:  Improving  Engagement in Group:  Engaged  Modes of Intervention:  Discussion, Education and Support  Summary of Progress/Problems: Patient attended lifestyle group and participated in deep breathing exercise. States her short term goal is achieve pain reduction today and long term to stay sober.  Natalie Mcdaniel 12/21/2014, 9:53 AM

## 2014-12-21 NOTE — Plan of Care (Signed)
Problem: Ineffective individual coping Goal: STG:Pt. will utilize relaxation techniques to reduce stress STG: Patient will utilize relaxation techniques to reduce stress levels  Outcome: Progressing Patient participating in deep breathing exercises. Open to trying new things.  Problem: Alteration in mood & ability to function due to Goal: STG-Patient will report withdrawal symptoms Outcome: Progressing Patient denying withdrawal symptoms at this time.

## 2014-12-21 NOTE — Progress Notes (Signed)
[facility] MD Progress Note  12/21/2014 1:43 PM Natalie Mcdaniel  MRN:  [id number] Subjective:  Natalie Mcdaniel states she is still dealing with the depression. She is being detox. She is upset she allowed herself to get to this point. Expresses regrets. She is most bothered by the neuropathic pain. States that she drinks in part to deal with the depression and the pain. But admits she is aware of how the alcohol contributes to both Diagnosis:   DSM5: Substance/Addictive Disorders:  Alcohol Related Disorder - Severe (303.90) Depressive Disorders:  Major Depressive Disorder - Severe (296.23) Total Time spent with patient: 30 minutes  Axis I: Anxiety Disorder NOS and Substance Induced Mood Disorder  ADL's:  Intact  Sleep: Fair  Appetite:  Poor  Psychiatric Specialty Exam: Physical Exam  Review of Systems  Constitutional: Positive for malaise/fatigue.  HENT: Negative.   Eyes: Negative.   Respiratory: Negative.   Cardiovascular: Negative.   Gastrointestinal: Positive for diarrhea.  Genitourinary: Negative.   Musculoskeletal:       Neuropathic pain both feet  Skin: Negative.   Neurological: Positive for weakness.  Psychiatric/Behavioral: Positive for depression and substance abuse. The patient is nervous/anxious.     Blood pressure 125/82, pulse 99, temperature 99.4 F (37.4 C), temperature source Oral, resp. rate 17, height 5\' 6"  (1.676 m), weight 71.668 kg (158 lb), last menstrual period 08/31/2013, SpO2 100 %.Body mass index is 25.51 kg/(m^2).  General Appearance: Fairly Groomed  Engineer, water::  Fair  Speech:  Clear and Coherent  Volume:  Decreased  Mood:  Anxious and Depressed  Affect:  Restricted  Thought Process:  Coherent and Goal Directed  Orientation:  Full (Time, Place, and Person)  Thought Content:  symptoms events worries concerns  Suicidal Thoughts:  No  Homicidal Thoughts:  No  Memory:  Immediate;   Fair Recent;   Fair Remote;   Fair  Judgement:  Fair  Insight:  Present   Psychomotor Activity:  Restlessness  Concentration:  Fair  Recall:  [employer] of Knowledge:Fair  Language: Fair  Akathisia:  No  Handed:    AIMS (if indicated):     Assets:  Desire for Improvement Housing Social Support Talents/Skills Vocational/Educational  Sleep:  Number of Hours: 6.75   Musculoskeletal: Strength & Muscle Tone: within normal limits Gait & Station: affected by the neurophatic pain in both her feet Patient leans: N/A  Current Medications: Current Facility-Administered Medications  Medication Dose Route Frequency Provider Last Rate Last Dose  . acetaminophen (TYLENOL) tablet 650 mg  650 mg Oral Q6H PRN Nicholaus Bloom, MD   650 mg at 12/21/14 [phone]  . alum & mag hydroxide-simeth (MAALOX/MYLANTA) 200-200-20 MG/5ML suspension 30 mL  30 mL Oral Q4H PRN Nicholaus Bloom, MD      . DULoxetine (CYMBALTA) DR capsule 20 mg  20 mg Oral Daily Nicholaus Bloom, MD   20 mg at 12/21/14 1301  . gabapentin (NEURONTIN) capsule 100 mg  100 mg Oral TID Nicholaus Bloom, MD   100 mg at 12/21/14 1301  . hydrOXYzine (ATARAX/VISTARIL) tablet 25 mg  25 mg Oral Q6H PRN Nena Polio, PA-C      . loperamide (IMODIUM) capsule 2-4 mg  2-4 mg Oral PRN Nena Polio, PA-C      . LORazepam (ATIVAN) tablet 1 mg  1 mg Oral Q6H PRN Nena Polio, PA-C      . LORazepam (ATIVAN) tablet 1 mg  1 mg Oral QID Nicholaus Bloom, MD   1  mg at 12/21/14 1302   Followed by  . [START ON 12/22/2014] LORazepam (ATIVAN) tablet 1 mg  1 mg Oral TID Nicholaus Bloom, MD       Followed by  . [START ON 12/23/2014] LORazepam (ATIVAN) tablet 1 mg  1 mg Oral BID Nicholaus Bloom, MD       Followed by  . [START ON 12/24/2014] LORazepam (ATIVAN) tablet 1 mg  1 mg Oral Daily Nicholaus Bloom, MD      . magnesium hydroxide (MILK OF MAGNESIA) suspension 30 mL  30 mL Oral Daily PRN Nicholaus Bloom, MD      . multivitamin with minerals tablet 1 tablet  1 tablet Oral Daily Nena Polio, PA-C   1 tablet at 12/21/14 [phone]  . ondansetron (ZOFRAN-ODT)  disintegrating tablet 4 mg  4 mg Oral Q6H PRN Nicholaus Bloom, MD      . thiamine (VITAMIN B-1) tablet 100 mg  100 mg Oral Daily Delfin Gant, NP   100 mg at 12/21/14 0834  . traZODone (DESYREL) tablet 50 mg  50 mg Oral QHS Nena Polio, PA-C   50 mg at 12/20/14 2145    Lab Results: No results found for this or any previous visit (from the past 48 hour(s)).  Physical Findings: AIMS: Facial and Oral Movements Muscles of Facial Expression: None, normal Lips and Perioral Area: None, normal Jaw: None, normal Tongue: None, normal,Extremity Movements Upper (arms, wrists, hands, fingers): None, normal Lower (legs, knees, ankles, toes): None, normal, Trunk Movements Neck, shoulders, hips: None, normal, Overall Severity Severity of abnormal movements (highest score from questions above): None, normal Incapacitation due to abnormal movements: None, normal Patient's awareness of abnormal movements (rate only patient's report): No Awareness, Dental Status Current problems with teeth and/or dentures?: Yes (broken teeth upper jaw) Does patient usually wear dentures?: No  CIWA:  CIWA-Ar Total: 0 COWS:     Treatment Plan Summary: Daily contact with patient to assess and evaluate symptoms and progress in treatment Medication management  Plan: Supportive approach/coping skills/relapse prevention           Alcohol Dependence: continue to detox safely/work a relapse prevention plan           Major Depression: will start Cymbalta 20 mg with the goal of targeting the depression                 the anxiety and the neuropathy Will adjust the dose base on tolerability            Neuropathy: will also add Neurontin 100 mg TID with plans to optimize response Medical Decision Making Problem Points:  Review of psycho-social stressors (1) Data Points:  Review of medication regiment & side effects (2) Review of new medications or change in dosage (2)  I certify that inpatient services furnished can  reasonably be expected to improve the patient's condition.   Amaal Dimartino A 12/21/2014, 1:43 PM

## 2014-12-22 DIAGNOSIS — F1994 Other psychoactive substance use, unspecified with psychoactive substance-induced mood disorder: Secondary | ICD-10-CM

## 2014-12-22 DIAGNOSIS — F419 Anxiety disorder, unspecified: Secondary | ICD-10-CM

## 2014-12-22 NOTE — Plan of Care (Signed)
Problem: Alteration in mood Goal: STG-Patient reports thoughts of self-harm to staff Outcome: Completed/Met Date Met:  12/22/14 Pt is safe and is free of self harm

## 2014-12-22 NOTE — BHH Group Notes (Signed)
[facility] LCSW Aftercare Discharge Planning Group Note   12/22/2014 8:45 AM  Participation Quality:  Alert, Appropriate and Oriented  Mood/Affect:  Flat and Depressed  Depression Rating:  9  Anxiety Rating:  5  Thoughts of Suicide:  Pt denies SI/HI  Will you contract for safety?   Yes  Current AVH:  Pt denies  Plan for Discharge/Comments:  Pt attended discharge planning group and actively participated in group.  CSW provided pt with today's workbook.  Pt states that she is ready to go home back to her husband and cats.  Pt states that she understands she will be here through the weekend though.  Pt states that she was recently started on medications for her nueroapthy.  Pt has follow up at [facility] for outpatient medication management and therapy.  No further needs voiced by pt at this time.    Transportation Means: Pt reports access to transportation  Supports: No supports mentioned at this time  Regan Lemming, LCSW 12/22/2014 10:10 AM

## 2014-12-22 NOTE — Tx Team (Signed)
Interdisciplinary Treatment Plan Update (Adult)  Date: 12/22/2014  Time Reviewed:  9:45 AM  Progress in Treatment: Attending groups: Yes Participating in groups:  Yes Taking medication as prescribed:  Yes Tolerating medication:  Yes Family/Significant othe contact made: Yes, with husband Patient understands diagnosis:  Yes Discussing patient identified problems/goals with staff:  Yes Medical problems stabilized or resolved:  Yes Denies suicidal/homicidal ideation: Yes Issues/concerns per patient self-inventory:  Yes Other:  New problem(s) identified: New medications started for neuroapthy  Discharge Plan or Barriers: Pt will return home in [place] and has follow up scheduled at [facility].    Reason for Continuation of Hospitalization: Anxiety Depression Medication Stabilization  Comments: N/A  Estimated length of stay: 2-3 days  For review of initial/current patient goals, please see plan of care.  Attendees: Patient:     Family:     Physician:  Dr. Corena Pilgrim 12/22/2014 10:25 AM   Nursing:   Loletta Specter, RN 12/22/2014 10:25 AM   Clinical Social Worker:  Regan Lemming, LCSW 12/22/2014 10:25 AM   Other: Grayland Ormond, RN 12/22/2014 10:25 AM   Other:  Ripley Fraise, LCSW 12/22/2014 10:26 AM   Other:     Other:     Other:    Other:    Other:    Other:    Other:    Other:     Scribe for Treatment Team:   Ane Payment, 12/22/2014 10:25 AM

## 2014-12-22 NOTE — Progress Notes (Addendum)
D:Patient in the dayroom on approach.  Patient states her day was better today.  Patient working on a puzzle and she states she enjoys doing that.  Patient states her goal for today was to get up and to be visible on the unit.  Patient states she met her goal for today.  Patient denies SI/HI and denies AVH. Patient became upset tonight when she was told she was going to get a roommate. Patient stated, "I don't want no roommate." Patient agreed it would be ok for tonight but states the roommate better not talk to her.  A: Staff to monitor Q 15 mins for safety.  Encouragement and support offered.  No scheduled medications administered per orders. R: Patient remains safe on the unit.  Patient attended group tonight.  Patient visible on the unit but not interacting with peers.  No medications administered tonight because patient refused Trazodone.

## 2014-12-22 NOTE — Plan of Care (Signed)
Problem: Ineffective individual coping Goal: STG: Pt will be able to identify effective and ineffective STG: Pt will be able to identify effective and ineffective coping patterns  Outcome: Not Progressing Patient very depressed today and unable to utilize coping skills.  Problem: Alteration in mood & ability to function due to Goal: STG-Patient will comply with prescribed medication regimen (Patient will comply with prescribed medication regimen)  Outcome: Progressing Patient has been med compliant.

## 2014-12-22 NOTE — Progress Notes (Signed)
Patient ID: Natalie Mcdaniel, female   DOB: 1966/10/15, 49 y.o.   MRN: [id number] [facility] MD Progress Note  12/22/2014 1:54 PM Natalie Mcdaniel  MRN:  [id number]  Subjective:  Natalie Mcdaniel says she now trying to deal with her own behavior to engage in excessive alcohol abuse as a way to deal with pain and disappointments from losing her jobs. She says losing her jobs resulted from her alcoholism because she had called in several times from work, and had showed up at work drunk. She is blaming herself for being the cause of her own pain. 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 She is however, looking forward to going to a rehab place after discharge. She is currently battling a lot of guilt.  O: Chart reviewed, Natalie Mcdaniel is visible on the unit. She attends and participates in group sessions. She appears very depressed and teary with self blame. She uses the wheel chair to aid her mobility within the unit. She propels her wheel chair per self. She is currently denying any new issues.  Diagnosis:   DSM5: Substance/Addictive Disorders:  Alcohol Related Disorder - Severe (303.90) Depressive Disorders:  Major Depressive Disorder - Severe (296.23) Total Time spent with patient: 30 minutes  Axis I: Anxiety Disorder NOS and Substance Induced Mood Disorder  ADL's:  Intact  Sleep: Fair  Appetite:  Poor  Psychiatric Specialty Exam: Physical Exam  Review of Systems  Constitutional: Positive for malaise/fatigue.  HENT: Negative.   Eyes: Negative.   Respiratory: Negative.   Cardiovascular: Negative.   Gastrointestinal: Positive for diarrhea.  Genitourinary: Negative.   Musculoskeletal:       Neuropathic pain both feet  Skin: Negative.   Neurological: Positive for weakness.  Psychiatric/Behavioral: Positive for depression and substance abuse. The patient is nervous/anxious.     Blood pressure 122/82, pulse 69, temperature 100.4 F (38 C), temperature source Oral, resp. rate 18, height 5\' 6"  (1.676  m), weight 71.668 kg (158 lb), last menstrual period 08/31/2013, SpO2 100 %.Body mass index is 25.51 kg/(m^2).  General Appearance: Fairly Groomed  Engineer, water::  Fair  Speech:  Clear and Coherent  Volume:  Decreased  Mood:  Anxious and Depressed  Affect:  Restricted  Thought Process:  Coherent and Goal Directed  Orientation:  Full (Time, Place, and Person)  Thought Content:  symptoms events worries concerns  Suicidal Thoughts:  No  Homicidal Thoughts:  No  Memory:  Immediate;   Fair Recent;   Fair Remote;   Fair  Judgement:  Fair  Insight:  Present  Psychomotor Activity:  Restlessness  Concentration:  Fair  Recall:  [employer] of Knowledge:Fair  Language: Fair  Akathisia:  No  Handed:    AIMS (if indicated):     Assets:  Desire for Improvement Housing Social Support Talents/Skills Vocational/Educational  Sleep:  Number of Hours: 6.75   Musculoskeletal: Strength & Muscle Tone: within normal limits Gait & Station: affected by the neurophatic pain in both her feet Patient leans: N/A  Current Medications: Current Facility-Administered Medications  Medication Dose Route Frequency Provider Last Rate Last Dose  . acetaminophen (TYLENOL) tablet 650 mg  650 mg Oral Q6H PRN Nicholaus Bloom, MD   650 mg at 12/21/14 [phone]  . alum & mag hydroxide-simeth (MAALOX/MYLANTA) 200-200-20 MG/5ML suspension 30 mL  30 mL Oral Q4H PRN Nicholaus Bloom, MD      . DULoxetine (CYMBALTA) DR capsule 20 mg  20 mg Oral Daily Nicholaus Bloom, MD   20  mg at 12/22/14 0830  . gabapentin (NEURONTIN) capsule 100 mg  100 mg Oral TID Nicholaus Bloom, MD   100 mg at 12/22/14 1259  . hydrOXYzine (ATARAX/VISTARIL) tablet 25 mg  25 mg Oral Q6H PRN Nena Polio, PA-C      . loperamide (IMODIUM) capsule 2-4 mg  2-4 mg Oral PRN Nena Polio, PA-C   4 mg at 12/21/14 2234  . LORazepam (ATIVAN) tablet 1 mg  1 mg Oral Q6H PRN Nena Polio, PA-C      . LORazepam (ATIVAN) tablet 1 mg  1 mg Oral TID Nicholaus Bloom, MD   1 mg at  12/22/14 1259   Followed by  . [START ON 12/23/2014] LORazepam (ATIVAN) tablet 1 mg  1 mg Oral BID Nicholaus Bloom, MD       Followed by  . [START ON 12/24/2014] LORazepam (ATIVAN) tablet 1 mg  1 mg Oral Daily Nicholaus Bloom, MD      . magnesium hydroxide (MILK OF MAGNESIA) suspension 30 mL  30 mL Oral Daily PRN Nicholaus Bloom, MD      . multivitamin with minerals tablet 1 tablet  1 tablet Oral Daily Nena Polio, PA-C   1 tablet at 12/22/14 0830  . ondansetron (ZOFRAN-ODT) disintegrating tablet 4 mg  4 mg Oral Q6H PRN Nicholaus Bloom, MD      . thiamine (VITAMIN B-1) tablet 100 mg  100 mg Oral Daily Delfin Gant, NP   100 mg at 12/22/14 0830  . traZODone (DESYREL) tablet 50 mg  50 mg Oral QHS Nena Polio, PA-C   50 mg at 12/21/14 2219    Lab Results: No results found for this or any previous visit (from the past 48 hour(s)).  Physical Findings: AIMS: Facial and Oral Movements Muscles of Facial Expression: None, normal Lips and Perioral Area: None, normal Jaw: None, normal Tongue: None, normal,Extremity Movements Upper (arms, wrists, hands, fingers): None, normal Lower (legs, knees, ankles, toes): None, normal, Trunk Movements Neck, shoulders, hips: None, normal, Overall Severity Severity of abnormal movements (highest score from questions above): None, normal Incapacitation due to abnormal movements: None, normal Patient's awareness of abnormal movements (rate only patient's report): No Awareness, Dental Status Current problems with teeth and/or dentures?: Yes (broken teeth upper jaw) Does patient usually wear dentures?: No  CIWA:  CIWA-Ar Total: 0 COWS:     Treatment Plan Summary: Daily contact with patient to assess and evaluate symptoms and progress in treatment Medication management  Plan: Supportive approach/coping skills/relapse prevention  Alcohol Dependence: continue to detox treatments safely, work a relapse prevention plan. Major Depression: Will continue Cymbalta 20 mg  with the goal of targeting the depression                 the anxiety and the neuropathy,  will adjust the dose base on tolerability Neuropathy: Continue Neurontin 100 mg TID with plans to optimize response. Continue current treatment plan.  Medical Decision Making Problem Points:  Review of psycho-social stressors (1) Data Points:  Review of medication regiment & side effects (2) Review of new medications or change in dosage (2)  I certify that inpatient services furnished can reasonably be expected to improve the patient's condition.   Encarnacion Slates, PMHNP, FNP-BC 12/22/2014, 1:54 PM  Patient seen, evaluated and I agree with notes by Nurse Practitioner. Corena Pilgrim, MD

## 2014-12-22 NOTE — Progress Notes (Signed)
Patient reports having a very difficult day today. "I'm just so depressed." Patient tearful, helpless. Patient displays difficulty remembering things, specifically medications. Also forgets wheelchair and walks without it at times. States the neurontin must be helping because while her pain remains at a baseline of 9/10, she does indicate she feels stronger. Needs prompting and reassurance to ask for basic needs. Support and reassurance given. Medicated per orders and education provided. Explained to patient she has only received 2 doses of cymbalta and that it would take some time to feel the effects. "I want to go home. I just miss my family." Had reported diarrhea to night nurse however denies today. Patient reminded of fall precautions. She denies SI/HI and remains safe. Will continue to monitor closely. Jamie Kato

## 2014-12-22 NOTE — B[facility] Group Notes (Signed)
Adult Psychoeducational Group Note  Date:  12/22/2014 Time:  9:48 PM  Group Topic/Focus:  AA Meeting  Participation Level:  Minimal  Participation Quality:  Attentive  Affect:  Appropriate  Cognitive:  Appropriate  Insight: Good  Engagement in Group:  Limited  Modes of Intervention:  Discussion and Education  Additional Comments:  Natalie Mcdaniel attended group.  [name] A 12/22/2014, 9:48 PM

## 2014-12-23 MED ORDER — LORAZEPAM 1 MG PO TABS: 1.0000 mg | ORAL_TABLET | Freq: Three times a day (TID) | ORAL | Status: DC | PRN

## 2014-12-23 MED ORDER — ZOLPIDEM TARTRATE 5 MG PO TABS: 5.0000 mg | ORAL_TABLET | Freq: Every evening | ORAL | Status: DC | PRN

## 2014-12-23 MED ORDER — ALUM & MAG HYDROXIDE-SIMETH 200-200-20 MG/5ML PO SUSP: 30.0000 mL | ORAL | Status: DC | PRN

## 2014-12-23 MED ORDER — IBUPROFEN 200 MG PO TABS
600.0000 mg | ORAL_TABLET | Freq: Three times a day (TID) | ORAL | Status: DC | PRN
  Administered 2014-12-23 – 2014-12-24 (×2): 600 mg via ORAL
  Filled 2014-12-23 (×2): qty 3

## 2014-12-23 MED ORDER — ONDANSETRON HCL 4 MG PO TABS: 4.0000 mg | ORAL_TABLET | Freq: Three times a day (TID) | ORAL | Status: DC | PRN

## 2014-12-23 MED ORDER — NICOTINE 21 MG/24HR TD PT24
21.0000 mg | MEDICATED_PATCH | Freq: Every day | TRANSDERMAL | Status: DC
  Filled 2014-12-23 (×4): qty 1

## 2014-12-23 MED ORDER — ACETAMINOPHEN 325 MG PO TABS: 650.0000 mg | ORAL_TABLET | ORAL | Status: DC | PRN

## 2014-12-23 NOTE — Progress Notes (Signed)
Psychoeducational Group Note  Date:  07/31/2012 Time: 1015  Group Topic/Focus:  Identifying Needs:   The focus of this group is to help patients identify their personal needs that have been historically problematic and identify healthy behaviors to address their needs.  Participation Level:  Active  Participation Quality: good  Affect: flat  Cognitive:  intact  Insight:  good  Engagement in Group: engaged  Additional Comments:    PD RN [facility]

## 2014-12-23 NOTE — Progress Notes (Signed)
Adult Psychoeducational Group Note  Date:  12/23/2014 Time:  3:30PM  Group Topic/Focus:  Making Healthy Choices:   The focus of this group is to help patients identify negative/unhealthy choices they were using prior to admission and identify positive/healthier coping strategies to replace them upon discharge.  Participation Level:  Did Not Attend  Additional Comments:  Pt did not attend group. Pt was in her room, in the bed asleep during group time   Jaslynn Thome K 12/23/2014, 4:10 PM

## 2014-12-23 NOTE — Progress Notes (Signed)
D.  Pt pleasant on approach, denies complaints at this time.  Pt denies need for sleep medication and refused her Trazodone stating that she has been sleeping  fine without it.   Pt was positive for evening AA group, and interacting appropriately with peers on unit.  Pt denies SI/HI/hallucinations at this time.  A.  Support and encouragement offered  R.  Pt remains safe on unit, utilizing wheelchair for ambulation.  Will continue to monitor.

## 2014-12-23 NOTE — Progress Notes (Signed)
Patient ID: Natalie Mcdaniel, female   DOB: September 07, 1966, 49 y.o.   MRN: [id number] Patient ID: Natalie Mcdaniel, female   DOB: May 06, 1966, 49 y.o.   MRN: [id number] [facility] MD Progress Note  12/23/2014 1:54 PM Natalie Mcdaniel  MRN:  [id number]  Subjective:  Patient is seen and is up and active on the unit. She notes today that she is sleeping well for the past two nights without sleeping medication. She is attending groups and states she has no withdrawal symptoms. She also notes that her feet are swollen and asks for a letter for disability because she can't stand on her feet currently to work. She states she has tingling in her feet, but notes that her headaches have decreased.    She also states she does not want to go to an in patient treatment facility because she has no one to take care of her pets the way she would.  O: Patient has no tremors and denies all W/D symptoms. She also notes that she is not SI/HI or AVH. Her mood is depressed and she is tearful. She continues to show little to no insight into her problem with alcohol. She states her anxiety is a 1/10 but notes her "stress" is a 10/10 as she is fearful of relapse, is not currently at home with her family who she states is very supportive, but clearly didn't feed her cats!      Natalie Mcdaniel continues to make excuses for her drinking. She does volunteer for out patient treatment and states she is going to a place called [facility].   Diagnosis:   DSM5: Substance/Addictive Disorders:  Alcohol Related Disorder - Severe (303.90) Depressive Disorders:  Major Depressive Disorder - Severe (296.23) Total Time spent with patient: 30 minutes  Axis I: Anxiety Disorder NOS and Substance Induced Mood Disorder  ADL's:  Intact  Sleep: Fair  Appetite:  Poor  Psychiatric Specialty Exam: Physical Exam  Review of Systems  Constitutional: Positive for malaise/fatigue.  HENT: Negative.   Eyes: Negative.   Respiratory: Negative.   Cardiovascular:  Negative.   Gastrointestinal: Positive for diarrhea.  Genitourinary: Negative.   Musculoskeletal:       Neuropathic pain both feet  Skin: Negative.   Neurological: Positive for weakness.  Psychiatric/Behavioral: Positive for depression and substance abuse. The patient is nervous/anxious.     Blood pressure 123/72, pulse 87, temperature 98.5 F (36.9 C), temperature source Oral, resp. rate 16, height 5\' 6"  (1.676 m), weight 71.668 kg (158 lb), last menstrual period 08/31/2013, SpO2 100 %.Body mass index is 25.51 kg/(m^2).  General Appearance: Fairly Groomed  Engineer, water::  Fair  Speech:  Clear and Coherent  Volume:  Decreased  Mood:  Anxious and Depressed  Affect:  Depressed and tearful  Thought Process:  Coherent and Goal Directed  Orientation:  Full (Time, Place, and Person)  Thought Content:  symptoms events worries concerns  Suicidal Thoughts:  No  Homicidal Thoughts:  No  Memory:  Immediate;   Fair Recent;   Fair Remote;   Fair  Judgement:  Fair  Insight:  Present  Psychomotor Activity:  Restlessness  Concentration:  Fair  Recall:  [employer] [place]: Fair  Akathisia:  No  Handed:    AIMS (if indicated):     Assets:  Desire for Improvement Housing Social Support Talents/Skills Vocational/Educational  Sleep:  Number of Hours: 5.75   Musculoskeletal: Strength & Muscle Tone: within normal limits Gait & Station: affected by the  neurophatic pain in both her feet Patient leans: N/A  Current Medications: Current Facility-Administered Medications  Medication Dose Route Frequency Provider Last Rate Last Dose  . acetaminophen (TYLENOL) tablet 650 mg  650 mg Oral Q4H PRN Delfin Gant, NP      . alum & mag hydroxide-simeth (MAALOX/MYLANTA) 200-200-20 MG/5ML suspension 30 mL  30 mL Oral PRN Delfin Gant, NP      . DULoxetine (CYMBALTA) DR capsule 20 mg  20 mg Oral Daily Nicholaus Bloom, MD   20 mg at 12/23/14 0177  . gabapentin (NEURONTIN)  capsule 100 mg  100 mg Oral TID Nicholaus Bloom, MD   100 mg at 12/23/14 1255  . ibuprofen (ADVIL,MOTRIN) tablet 600 mg  600 mg Oral Q8H PRN Delfin Gant, NP      . LORazepam (ATIVAN) tablet 1 mg  1 mg Oral BID Nicholaus Bloom, MD   1 mg at 12/23/14 9390   Followed by  . [START ON 12/24/2014] LORazepam (ATIVAN) tablet 1 mg  1 mg Oral Daily Nicholaus Bloom, MD      . LORazepam (ATIVAN) tablet 1 mg  1 mg Oral Q8H PRN Delfin Gant, NP      . magnesium hydroxide (MILK OF MAGNESIA) suspension 30 mL  30 mL Oral Daily PRN Nicholaus Bloom, MD      . multivitamin with minerals tablet 1 tablet  1 tablet Oral Daily Nena Polio, PA-C   1 tablet at 12/23/14 3009  . nicotine (NICODERM CQ - dosed in mg/24 hours) patch 21 mg  21 mg Transdermal Daily Delfin Gant, NP   21 mg at 12/23/14 1025  . ondansetron (ZOFRAN) tablet 4 mg  4 mg Oral Q8H PRN Delfin Gant, NP      . ondansetron (ZOFRAN-ODT) disintegrating tablet 4 mg  4 mg Oral Q6H PRN Nicholaus Bloom, MD      . thiamine (VITAMIN B-1) tablet 100 mg  100 mg Oral Daily Delfin Gant, NP   100 mg at 12/23/14 2330  . traZODone (DESYREL) tablet 50 mg  50 mg Oral QHS Nena Polio, PA-C   50 mg at 12/21/14 2219  . zolpidem (AMBIEN) tablet 5 mg  5 mg Oral QHS PRN Delfin Gant, NP        Lab Results: No results found for this or any previous visit (from the past 48 hour(s)).  Physical Findings: AIMS: Facial and Oral Movements Muscles of Facial Expression: None, normal Lips and Perioral Area: None, normal Jaw: None, normal Tongue: None, normal,Extremity Movements Upper (arms, wrists, hands, fingers): None, normal Lower (legs, knees, ankles, toes): None, normal, Trunk Movements Neck, shoulders, hips: None, normal, Overall Severity Severity of abnormal movements (highest score from questions above): None, normal Incapacitation due to abnormal movements: None, normal Patient's awareness of abnormal movements (rate only patient's report):  No Awareness, Dental Status Current problems with teeth and/or dentures?: Yes (broken teeth upper jaw) Does patient usually wear dentures?: No  CIWA:  CIWA-Ar Total: 0 COWS:     Treatment Plan Summary: Daily contact with patient to assess and evaluate symptoms and progress in treatment Medication management  Plan:  1. Continue crisis management and stabilization. 2. Continue medication management by assessing side effects, dose modification as needed and therapeutic blood levels as indicated. 3. Treat health problems as indicated or consult IM as needed. 4. Continue treatment plan to decrease risk of relapse upon discharge and to reduce the  need for readmission to incorporate the use of local resources for support. 5. Psycho-social education regarding relapse prevention through good self care to incorporate diet, exercise, stress reduction, good sleep hygiene, and reduction in external risk factors such as alcohol, tobacco, and substance abuse. 6. Gain consent for contact with family, PCP, or outside health provider as needed. 7. Review home medications as needed. 8. Will d/c Ambien since she has trazodone.Marlane Hatcher. Mashburn RPAC 2:24 PM 12/23/2014   Medical Decision Making Problem Points:  Review of psycho-social stressors (1) Data Points:  Review of medication regiment & side effects (2) Review of new medications or change in dosage (2)  I certify that inpatient services furnished can reasonably be expected to improve the patient's condition.    Patient seen, evaluated and I agree with notes by Nurse Practitioner. Corena Pilgrim, MD

## 2014-12-23 NOTE — Progress Notes (Signed)
The patient attended the  A.A. Meeting and was appropriate.   

## 2014-12-23 NOTE — Progress Notes (Signed)
Patient irritable and complaining of a headache. PRN's provided for complaints.Patient stating she wants to go home. Patient states she misses her pet cats and her routine. Patient complaining about food. Alternative offered. Patient difficult to console.

## 2014-12-23 NOTE — [facility] Group Notes (Signed)
[place] LCSW Group Therapy  12/23/2014 1:15 PM  Type of Therapy:  Group Therapy  Participation Level:  Active  Participation Quality:  Appropriate  Affect:  Appropriate  Cognitive:  Oriented  Insight:  Limited  Engagement in Therapy:  Limited; as patient was called out to met with physician  Modes of Intervention:  Exploration, Rapport Building, Socialization and Support  Summary of Progress/Problems: Summary of Progress/Problems: The main focus of today's process group was for the patient to identify ways in which they have in the past sabotaged their own recovery. Motivational Interviewing was utilized to ask the group members what they get out of their self sabotage, and what reasons they may have for wanting to change. The patient expressed that she has self sabotaged for years isolating in her room at home and using alcohol to self medicate. [name] states that she is looking forward to finding a new job and returning to her hobbies of sewing and crafting although she is unable to state how she will avoid the use of alcohol.   Natalie Mcdaniel 12/23/2014

## 2014-12-23 NOTE — Progress Notes (Signed)
D: Patient denies SI/HI and auditory and visual hallucinations. Patient has a depressed mood and affect. States that her depression level today is 9 out of 10. Goal for today was to walk as much as posibble. This AM stated that she had felt much better  "painwise" and had been able to walk from her room to the med room without much pain. States she is still having withdrawal symptoms of craving and diarrhea. Is irritable.  More interaction with peers.   A: Patient given emotional support from RN. Patient given medications per MD orders. Patient encouraged to attend groups and unit activities. Patient encouraged to come to staff with any questions or concerns.  R: Patient remains appropriate but irritable. Continue to monitor for safety.

## 2014-12-24 NOTE — Progress Notes (Signed)
D) Pt has been up and in the dayroom interacting with her peers. States "I am much better. I can walk and it isn't hurting me". Pt states she is feeling better overall. "Do you think it is because of the Neurontin?" Affect is pleasant and Pt has been attending the groups and participating. A) Given support, reassurance and praise. Encouraged to speak with her doctor to let them know how she is responding to the medication R) Pt attending the groups and participating.

## 2014-12-24 NOTE — Progress Notes (Signed)
Patient ID: Natalie Mcdaniel, female   DOB: [birth year], 49 y.o.   MRN: [id number] [facility] MD Progress Note  12/24/2014 1:12 PM Natalie Mcdaniel  MRN:  [id number]  Subjective:  Patient is seen and is up and active on the unit. She notes today that she is sleeping well for the past two nights without sleeping medication. She is attending groups and states she has no withdrawal symptoms.        She notes today that she again had no trouble sleeping and today is walking without using a wheelchair for support.  She asks again about discharge, due to the things she must get done at home, because her family won't do them the way she would. O: Patient has no tremors and denies all W/D symptoms. She also notes that she is not SI/HI or AVH. She makes good eye contact and her affect is brighter. She reports that her depression is a 5/10 and her anxiety is a 1/10. She continues to have little or no insight into her situation, but is reporting getting coping skills from the weekend groups by Chong Sicilian and Gerald Stabs..  Diagnosis:   DSM5: Substance/Addictive Disorders:  Alcohol Related Disorder - Severe (303.90) Depressive Disorders:  Major Depressive Disorder - Severe (296.23) Total Time spent with patient: 30 minutes  Axis I: Anxiety Disorder NOS and Substance Induced Mood Disorder  ADL's:  Intact  Sleep: Fair  Appetite:  Poor  Psychiatric Specialty Exam: Physical Exam  Review of Systems  Constitutional: Positive for malaise/fatigue.  HENT: Negative.   Eyes: Negative.   Respiratory: Negative.   Cardiovascular: Negative.   Gastrointestinal: Positive for diarrhea.  Genitourinary: Negative.   Musculoskeletal:       Neuropathic pain both feet  Skin: Negative.   Neurological: Positive for weakness.  Psychiatric/Behavioral: Positive for depression and substance abuse. The patient is nervous/anxious.     Blood pressure 126/86, pulse 73, temperature 98.6 F (37 C), temperature source Oral, resp. rate 18, height  5\' 6"  (1.676 m), weight 71.668 kg (158 lb), last menstrual period 08/31/2013, SpO2 100 %.Body mass index is 25.51 kg/(m^2).  General Appearance: Fairly Groomed  Engineer, water::  Fair  Speech:  Clear and Coherent  Volume:  Decreased  Mood:  Anxious and Depressed  Affect:  Depressed and tearful  Thought Process:  Coherent and Goal Directed  Orientation:  Full (Time, Place, and Person)  Thought Content:  symptoms events worries concerns  Suicidal Thoughts:  No  Homicidal Thoughts:  No  Memory:  Immediate;   Fair Recent;   Fair Remote;   Fair  Judgement:  Fair  Insight:  Present  Psychomotor Activity:  Restlessness  Concentration:  Fair  Recall:  [employer] [place]: Fair  Akathisia:  No  Handed:    AIMS (if indicated):     Assets:  Desire for Improvement Housing Social Support Talents/Skills Vocational/Educational  Sleep:  Number of Hours: 6.25   Musculoskeletal: Strength & Muscle Tone: within normal limits Gait & Station: affected by the neurophatic pain in both her feet Patient leans: N/A  Current Medications: Current Facility-Administered Medications  Medication Dose Route Frequency Provider Last Rate Last Dose  . acetaminophen (TYLENOL) tablet 650 mg  650 mg Oral Q4H PRN Delfin Gant, NP      . alum & mag hydroxide-simeth (MAALOX/MYLANTA) 200-200-20 MG/5ML suspension 30 mL  30 mL Oral PRN Delfin Gant, NP      . DULoxetine (CYMBALTA) DR capsule 20 mg  20 mg Oral Daily Nicholaus Bloom, MD   20 mg at 12/23/14 4097  . gabapentin (NEURONTIN) capsule 100 mg  100 mg Oral TID Nicholaus Bloom, MD   100 mg at 12/23/14 1255  . ibuprofen (ADVIL,MOTRIN) tablet 600 mg  600 mg Oral Q8H PRN Delfin Gant, NP      . LORazepam (ATIVAN) tablet 1 mg  1 mg Oral BID Nicholaus Bloom, MD   1 mg at 12/23/14 3532   Followed by  . [START ON 12/24/2014] LORazepam (ATIVAN) tablet 1 mg  1 mg Oral Daily Nicholaus Bloom, MD      . LORazepam (ATIVAN) tablet 1 mg  1 mg Oral  Q8H PRN Delfin Gant, NP      . magnesium hydroxide (MILK OF MAGNESIA) suspension 30 mL  30 mL Oral Daily PRN Nicholaus Bloom, MD      . multivitamin with minerals tablet 1 tablet  1 tablet Oral Daily Nena Polio, PA-C   1 tablet at 12/23/14 9924  . nicotine (NICODERM CQ - dosed in mg/24 hours) patch 21 mg  21 mg Transdermal Daily Delfin Gant, NP   21 mg at 12/23/14 1025  . ondansetron (ZOFRAN) tablet 4 mg  4 mg Oral Q8H PRN Delfin Gant, NP      . ondansetron (ZOFRAN-ODT) disintegrating tablet 4 mg  4 mg Oral Q6H PRN Nicholaus Bloom, MD      . thiamine (VITAMIN B-1) tablet 100 mg  100 mg Oral Daily Delfin Gant, NP   100 mg at 12/23/14 2683  . traZODone (DESYREL) tablet 50 mg  50 mg Oral QHS Nena Polio, PA-C   50 mg at 12/21/14 2219  . zolpidem (AMBIEN) tablet 5 mg  5 mg Oral QHS PRN Delfin Gant, NP        Lab Results: No results found for this or any previous visit (from the past 48 hour(s)).  Physical Findings: AIMS: Facial and Oral Movements Muscles of Facial Expression: None, normal Lips and Perioral Area: None, normal Jaw: None, normal Tongue: None, normal,Extremity Movements Upper (arms, wrists, hands, fingers): None, normal Lower (legs, knees, ankles, toes): None, normal, Trunk Movements Neck, shoulders, hips: None, normal, Overall Severity Severity of abnormal movements (highest score from questions above): None, normal Incapacitation due to abnormal movements: None, normal Patient's awareness of abnormal movements (rate only patient's report): No Awareness, Dental Status Current problems with teeth and/or dentures?: No Does patient usually wear dentures?: No  CIWA:  CIWA-Ar Total: 0 COWS:     Treatment Plan Summary: Daily contact with patient to assess and evaluate symptoms and progress in treatment Medication management  Plan:  1. Continue crisis management and stabilization. 2. Continue medication management by assessing side effects,  dose modification as needed and therapeutic blood levels as indicated. 3. Treat health problems as indicated or consult IM as needed. 4. Continue treatment plan to decrease risk of relapse upon discharge and to reduce the     need for readmission to incorporate the use of local resources for support. 5. Psycho-social education regarding relapse prevention through good self care to incorporate diet, exercise, stress reduction, good sleep hygiene, and reduction in external risk factors such as alcohol, tobacco, and substance abuse. 6. Gain consent for contact with family, PCP, or outside health provider as needed. 7. Review home medications as needed.   Medical Decision Making Problem Points:  Review of psycho-social stressors (1) Data Points:  Review of medication  regiment & side effects (2) Review of new medications or change in dosage (2)  I certify that inpatient services furnished can reasonably be expected to improve the patient's condition.   Marlane Hatcher. Mashburn RPAC 1:15 PM 12/24/2014  Patient seen, evaluated and I agree with notes by Nurse Practitioner. Corena Pilgrim, MD

## 2014-12-24 NOTE — BHH Group Notes (Signed)
[place] LCSW Group Therapy  12/24/2014 1:15 PM  Type of Therapy:  Group Therapy  Participation Level:  None  Participation Quality:  Attentive  Affect:  Appropriate  Cognitive:  Oriented  Insight:  None shared  Engagement in Therapy:  None  Modes of Intervention:  Discussion, Exploration, Orientation, Socialization and Support  Summary of Progress/Problems: The main focus of today's process group was to identify the patient's current support system and decide on other supports that can be put in place. An emphasis was placed on using counselor, doctor, therapy groups, 12-step groups, and problem-specific support groups to expand supports. There was also an extensive discussion about what constitutes a healthy support versus an unhealthy support. The patient was present only for the last ten minutes of group (although group was announced to pt and her roommate in their room before group began). Patient said nothing and appeared to be attentive.   Sheilah Pigeon, LCSW

## 2014-12-24 NOTE — Progress Notes (Signed)
Patient did attend the evening speaker AA meeting.  

## 2014-12-24 NOTE — Progress Notes (Signed)
D.  Pt pleasant on approach, concerned about swelling in feet.   Pt would like the doctor to assess this in the AM.  Denies SI/HI/hallucinations at this time, positive for evening AA group.  Interacting appropriately with peers on the unit.  Brighter affect over all this evening.  A.  Support and encouragement offered, encouraged Pt to elevate feet tonight.  R.  Pt remains safe on unit, has foot of bed elevated.  Will continue to monitor.

## 2014-12-24 NOTE — Progress Notes (Signed)
Psychoeducational Group Note  Date:  12/24/2014 Time:  1015  Group Topic/Focus:  Making Healthy Choices:   The focus of this group is to help patients identify negative/unhealthy choices they were using prior to admission and identify positive/healthier coping strategies to replace them upon discharge.  Participation Level:  Active  Participation Quality:  Appropriate  Affect:  Appropriate  Cognitive:  Oriented  Insight:  Improving  Engagement in Group:  Engaged  Additional Comments:  Pt participated and added much to the group.  Paulino Rily 12/24/2014

## 2014-12-25 DIAGNOSIS — F1020 Alcohol dependence, uncomplicated: Principal | ICD-10-CM

## 2014-12-25 MED ORDER — TRAZODONE HCL 50 MG PO TABS: 50.0000 mg | ORAL_TABLET | Freq: Every day | ORAL | Status: DC

## 2014-12-25 MED ORDER — DULOXETINE HCL 30 MG PO CPEP
30.0000 mg | ORAL_CAPSULE | Freq: Every day | ORAL | Status: DC
  Filled 2014-12-25: qty 14
  Filled 2014-12-25: qty 1

## 2014-12-25 MED ORDER — DULOXETINE HCL 30 MG PO CPEP: 30.0000 mg | ORAL_CAPSULE | Freq: Every day | ORAL | Status: DC

## 2014-12-25 MED ORDER — GABAPENTIN 100 MG PO CAPS
100.0000 mg | ORAL_CAPSULE | Freq: Three times a day (TID) | ORAL | Status: DC
Start: 2014-12-25 — End: 2015-03-04

## 2014-12-25 NOTE — Progress Notes (Signed)
DISCHARGE NOTE: D: Patient was alert, oriented, in stable condition, and ambulatory with a steady gait upon discharge. Pt denies SI/HI and AVH. A: AVS reviewed and given to pt. Follow up reviewed with pt. Resources reviewed with pt, including [facility]. Prescriptions/Medications given to pt. Belongings returned to pt. Pt given time to ask questions and express concerns. R: Pt D/C'd to husband.

## 2014-12-25 NOTE — Progress Notes (Signed)
D: Patient alert and oriented. Pt's mood and affect is depressed and sad/blunted. Pt's eye contact is fair. Pt reports her depression, hopelessness, and anxiety 0/10. Pt reports neuropathy in both legs bilaterally with some swelling. Pt denies SI/HI and AVH at this time. Patient reports she is ready to leave today. Pt attending groups. A: Pt encouraged to elevate feet. Nicotine patch order discontinued d/t pt stating "I've never been a smoker, I don't need that order." Scheduled medications administered per providers orders (See MAR). 15 minute checks continued per protocol for patient safety.  R: Patient cooperative and receptive to nursing interventions.
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
## 2014-12-25 NOTE — Progress Notes (Signed)
[facility] Adult Case Management Discharge Plan :  Will you be returning to the same living situation after discharge: Yes,  patient plans to return home with her family At discharge, do you have transportation home?:Yes,  patient reports that she will have a family member provide transportation home Do you have the ability to pay for your medications:Yes,  patient will be provided with prescriptions at discharge  Release of information consent forms completed and in the chart;  Patient's signature needed at discharge.  Patient to Follow up at: Follow-up Information    Follow up with [facility] On 12/26/2014.   Why:  Assessment with Trude Mcburney on Tuesday, January 5th at 12 pm. Please arrive by 11:45 and bring your insurance card. Please call office if you need to reschedule.   Contact information:   [address],  [place], [place] [address] [phone]      Patient denies SI/HI:   Yes,  denies    Safety Planning and Suicide Prevention discussed:  Yes,  with patient and husband  N/A patient is not a smoker   [name], Casimiro Needle 12/25/2014, 11:16 AM

## 2014-12-25 NOTE — [facility] Group Notes (Signed)
   [facility] LCSW Aftercare Discharge Planning Group Note  12/25/2014  8:45 AM   Participation Quality: Alert, Appropriate and Oriented  Mood/Affect: Appropriate  Depression Rating: 0  Anxiety Rating: 0  Thoughts of Suicide: Pt denies SI/HI  Will you contract for safety? Yes  Current AVH: Pt denies  Plan for Discharge/Comments: Pt attended discharge planning group and actively participated in group. CSW provided pt with today's workbook. Patient reports feeling "great" today and verbalized that she hopes to discharge back home today to follow up with [facility] tomorrow 12/26/14 for an assessment. She was able to ambulate well without need for mobility device.   Transportation Means: Pt reports access to transportation  Supports: No supports mentioned at this time  Tilden Fossa, MSW, [place] Social Worker [employer] [phone]

## 2014-12-27 NOTE — Progress Notes (Signed)
Patient Discharge Instructions:  After Visit Summary (AVS):   Faxed to:  12/27/14 Discharge Summary Note:   Faxed to:  12/27/14 Psychiatric Admission Assessment Note:   Faxed to:  12/27/14 Suicide Risk Assessment - Discharge Assessment:   Faxed to:  12/27/14 Faxed/Sent to the Next Level Care provider:  12/27/14 Faxed to [facility] [place], 12/27/2014, 3:24 PM

## 2015-01-06 ENCOUNTER — Emergency Department (HOSPITAL_COMMUNITY)
Admission: EM | Admit: 2015-01-06 | Discharge: 2015-01-06 | Disposition: A | Discharge status: Home / Self Care | Payer: BLUE CROSS/BLUE SHIELD | Attending: Emergency Medicine | Admitting: Emergency Medicine

## 2015-01-06 ENCOUNTER — Encounter (HOSPITAL_COMMUNITY): Payer: Self-pay | Admitting: Emergency Medicine

## 2015-01-06 DIAGNOSIS — Z008 Encounter for other general examination: Secondary | ICD-10-CM | POA: Diagnosis present

## 2015-01-06 DIAGNOSIS — Z79899 Other long term (current) drug therapy: Secondary | ICD-10-CM | POA: Diagnosis not present

## 2015-01-06 DIAGNOSIS — F10929 Alcohol use, unspecified with intoxication, unspecified: Secondary | ICD-10-CM

## 2015-01-06 DIAGNOSIS — E669 Obesity, unspecified: Secondary | ICD-10-CM | POA: Insufficient documentation

## 2015-01-06 DIAGNOSIS — Z8709 Personal history of other diseases of the respiratory system: Secondary | ICD-10-CM | POA: Diagnosis not present

## 2015-01-06 DIAGNOSIS — F1020 Alcohol dependence, uncomplicated: Secondary | ICD-10-CM

## 2015-01-06 DIAGNOSIS — F10129 Alcohol abuse with intoxication, unspecified: Secondary | ICD-10-CM | POA: Diagnosis not present

## 2015-01-06 DIAGNOSIS — F419 Anxiety disorder, unspecified: Secondary | ICD-10-CM | POA: Insufficient documentation

## 2015-01-06 DIAGNOSIS — F329 Major depressive disorder, single episode, unspecified: Secondary | ICD-10-CM | POA: Diagnosis not present

## 2015-01-06 LAB — SALICYLATE LEVEL: Salicylate Lvl: <4 mg/dL (ref 2.8–20.0)

## 2015-01-06 LAB — ETHANOL: ALCOHOL ETHYL (B): 377 mg/dL — AB (ref 0–9)

## 2015-01-06 LAB — RAPID URINE DRUG SCREEN, HOSP PERFORMED
AMPHETAMINES: NOT DETECTED
BENZODIAZEPINES: NOT DETECTED
Barbiturates: NOT DETECTED
Cocaine: NOT DETECTED
OPIATES: NOT DETECTED
TETRAHYDROCANNABINOL: NOT DETECTED

## 2015-01-06 LAB — ACETAMINOPHEN LEVEL

## 2015-01-06 MED ORDER — GABAPENTIN 100 MG PO CAPS
100.0000 mg | ORAL_CAPSULE | Freq: Three times a day (TID) | ORAL | Status: DC
  Administered 2015-01-06 (×2): 100 mg via ORAL
  Filled 2015-01-06: qty 1

## 2015-01-06 MED ORDER — LORAZEPAM 1 MG PO TABS: 1.0000 mg | ORAL_TABLET | Freq: Three times a day (TID) | ORAL | Status: DC | PRN

## 2015-01-06 MED ORDER — LORAZEPAM 1 MG PO TABS: 0.0000 mg | ORAL_TABLET | Freq: Two times a day (BID) | ORAL | Status: DC

## 2015-01-06 MED ORDER — TRAZODONE HCL 50 MG PO TABS: 50.0000 mg | ORAL_TABLET | Freq: Every day | ORAL | Status: DC

## 2015-01-06 MED ORDER — LORAZEPAM 1 MG PO TABS
0.0000 mg | ORAL_TABLET | Freq: Four times a day (QID) | ORAL | Status: DC
  Administered 2015-01-06: 2 mg via ORAL
  Filled 2015-01-06: qty 2

## 2015-01-06 MED ORDER — VITAMIN B-1 100 MG PO TABS
100.0000 mg | ORAL_TABLET | Freq: Every day | ORAL | Status: DC
  Administered 2015-01-06: 100 mg via ORAL
  Filled 2015-01-06: qty 1

## 2015-01-06 MED ORDER — DULOXETINE HCL 30 MG PO CPEP
30.0000 mg | ORAL_CAPSULE | Freq: Every day | ORAL | Status: DC
  Administered 2015-01-06: 30 mg via ORAL
  Filled 2015-01-06: qty 1

## 2015-01-06 MED ORDER — THIAMINE HCL 100 MG/ML IJ SOLN: 100.0000 mg | Freq: Every day | INTRAMUSCULAR | Status: DC

## 2015-01-06 MED ORDER — ACETAMINOPHEN 325 MG PO TABS
650.0000 mg | ORAL_TABLET | Freq: Once | ORAL | Status: AC
  Administered 2015-01-06: 650 mg via ORAL
  Filled 2015-01-06: qty 2

## 2015-01-06 MED ORDER — ZIPRASIDONE MESYLATE 20 MG IM SOLR
20.0000 mg | Freq: Once | INTRAMUSCULAR | Status: AC
  Administered 2015-01-06: 20 mg via INTRAMUSCULAR
  Filled 2015-01-06: qty 20

## 2015-01-06 MED ORDER — ONDANSETRON HCL 4 MG PO TABS
4.0000 mg | ORAL_TABLET | Freq: Three times a day (TID) | ORAL | Status: DC | PRN
Start: 2015-01-06 — End: 2015-01-07
  Administered 2015-01-06: 4 mg via ORAL
  Filled 2015-01-06: qty 1

## 2015-01-06 NOTE — ED Notes (Signed)
Patient awake, calm and cooperative as she watches television.  Patient c/o headache over frontal sinus area.  Patient's CIWA reassessed and found to be 3.  Verbal order for 650 mg of Tyelnol obtained from [facility].  Patient denies AVH and SI/HI.  Patient states she would like to go home and is not seeking inpatient treatment.

## 2015-01-06 NOTE — Discharge Instructions (Signed)
Alcohol Intoxication Alcohol intoxication occurs when the amount of alcohol that a person has consumed impairs his or her ability to mentally and physically function. Alcohol directly impairs the normal chemical activity of the brain. Drinking large amounts of alcohol can lead to changes in mental function and behavior, and it can cause many physical effects that can be harmful.  Alcohol intoxication can range in severity from mild to very severe. Various factors can affect the level of intoxication that occurs, such as the person's age, gender, weight, frequency of alcohol consumption, and the presence of other medical conditions (such as diabetes, seizures, or heart conditions). Dangerous levels of alcohol intoxication may occur when people drink large amounts of alcohol in a short period (binge drinking). Alcohol can also be especially dangerous when combined with certain prescription medicines or "recreational" drugs. SIGNS AND SYMPTOMS Some common signs and symptoms of mild alcohol intoxication include:  Loss of coordination.  Changes in mood and behavior.  Impaired judgment.  Slurred speech. As alcohol intoxication progresses to more severe levels, other signs and symptoms will appear. These may include:  Vomiting.  Confusion and impaired memory.  Slowed breathing.  Seizures.  Loss of consciousness. DIAGNOSIS  Your health care provider will take a medical history and perform a physical exam. You will be asked about the amount and type of alcohol you have consumed. Blood tests will be done to measure the concentration of alcohol in your blood. In many places, your blood alcohol level must be lower than 80 mg/dL (0.08%) to legally drive. However, many dangerous effects of alcohol can occur at much lower levels.  TREATMENT  People with alcohol intoxication often do not require treatment. Most of the effects of alcohol intoxication are temporary, and they go away as the alcohol naturally  leaves the body. Your health care provider will monitor your condition until you are stable enough to go home. Fluids are sometimes given through an IV access tube to help prevent dehydration.  HOME CARE INSTRUCTIONS  Do not drive after drinking alcohol.  Stay hydrated. Drink enough water and fluids to keep your urine clear or pale yellow. Avoid caffeine.   Only take over-the-counter or prescription medicines as directed by your health care provider.  SEEK MEDICAL CARE IF:   You have persistent vomiting.   You do not feel better after a few days.  You have frequent alcohol intoxication. Your health care provider can help determine if you should see a substance use treatment counselor. SEEK IMMEDIATE MEDICAL CARE IF:   You become shaky or tremble when you try to stop drinking.   You shake uncontrollably (seizure).   You throw up (vomit) blood. This may be bright red or may look like black coffee grounds.   You have blood in your stool. This may be bright red or may appear as a black, tarry, bad smelling stool.   You become lightheaded or faint.  MAKE SURE YOU:   Understand these instructions.  Will watch your condition.  Will get help right away if you are not doing well or get worse. Document Released: 09/17/2005 Document Revised: 08/10/2013 Document Reviewed: 05/13/2013 [facility] Patient Information 2015 [place], [place]. This information is not intended to replace advice given to you by your health care provider. Make sure you discuss any questions you have with your health care provider.   Emergency Department Resource Guide 1) Find a Doctor and Pay Out of Pocket Although you won't have to find out who is covered by your  insurance plan, it is a good idea to ask around and get recommendations. You will then need to call the office and see if the doctor you have chosen will accept you as a new patient and what types of options they offer for patients who are self-pay.  Some doctors offer discounts or will set up payment plans for their patients who do not have insurance, but you will need to ask so you aren't surprised when you get to your appointment.  2) Contact Your Local Health Department Not all health departments have doctors that can see patients for sick visits, but many do, so it is worth a call to see if yours does. If you don't know where your local health department is, you can check in your phone book. The CDC also has a tool to help you locate your state's health department, and many state websites also have listings of all of their local health departments.  3) Find a [facility] If your illness is not likely to be very severe or complicated, you may want to try a walk in clinic. These are popping up all over the country in pharmacies, drugstores, and shopping centers. They're usually staffed by nurse practitioners or physician assistants that have been trained to treat common illnesses and complaints. They're usually fairly quick and inexpensive. However, if you have serious medical issues or chronic medical problems, these are probably not your best option.  No Primary Care Doctor: - Call Health Connect at  [phone] - they can help you locate a primary care doctor that  accepts your insurance, provides certain services, etc. - Physician Referral Service- [phone]  Chronic Pain Problems: Organization         Address  Phone   Notes  [facility]  [phone] Patients need to be referred by their primary care doctor.   Medication Assistance: Organization         Address  Phone   Notes  [facility] Medication [facility] [address] [place], [place] [address] [phone] --Must be a resident of [facility] -- Must have NO insurance coverage whatsoever (no Medicaid/ Medicare, etc.) -- The pt. MUST have a primary care doctor that directs their care regularly and follows them in the  community   MedAssist  [phone]   [employer]  [phone]    Agencies that provide inexpensive medical care: Organization         Address  Phone   Notes  [place]  [phone]   [facility]    [phone]   [facility] [place], [place] [address] [phone]   [place] [address], [place] [phone]   Planned Parenthood    [phone]   [facility]    [phone]   [place] [address], [place] Phone:  [phone], Fax:  [phone] Hours of Operation:  9 am - 6 pm, M-F.  Also accepts Medicaid/Medicare and self-pay.  [facility] [place], [address], [place] Phone: [phone], Fax: [phone]. Hours of Operation:  8:30 am - 5:30 pm, M-F.  Also accepts Medicaid and self-pay.  [facility] [place] [address] [place] Phone: [phone]   [place], [place], [place] [phone], Ext. [phone] Mondays &  Thursdays: 7-9 AM.  First 15 patients are seen on a first come, first serve basis.    [place] Providers:  Organization         Address  Phone   Notes  [facility] [address], [place] [phone] Also accepts self-pay patients.  [facility] [address], [place]  [phone]   [place], [address], [place] [phone]   [facility] [address] [place] [phone]   Lucianne Lei [address], [place]   [phone] Only accepts [place] Access [place] patients after they have their name applied to their card.   Self-Pay (no insurance) in [facility]:  Organization         Address  Phone   Notes  Sickle Cell Patients, [facility]  Internal Medicine [place] [phone]   [facility] Urgent Care [place] [phone]   [facility] [place]  [place], [place], [place] [phone]   [facility]/Dr. Osei-Bonsu  [address], [place] or [address], [place] [phone] Phone number for both [place] and [place] locations is the same.  Urgent Medical and [facility] [address] [place] [phone]   [facility] [address] [place] or [address] [phone]   [facility] [address], [place] [phone], phone; [phone], fax Sees patients 1st and 3rd Saturday of every month.  Must not qualify for public or private insurance (i.e. Medicaid, Medicare, [place] Health Choice, Veterans' Benefits)  Household income should be no more than 200% of the poverty level The clinic cannot treat you if you are pregnant or think you are pregnant  Sexually transmitted diseases are not treated at the clinic.    Dental Care: Organization         Address  Phone  Notes  [facility] [place] [phone] Accepts children up to age 89 who are enrolled in [place] or [place]; pregnant women with a Medicaid card; and children who have applied for Medicaid or [place] Health Choice, but were declined, whose parents can pay a reduced fee at time of service.  [facility] Department of [facility]  [address], [place] [phone] Accepts children up to age 41 who are enrolled in [place] or [place]; pregnant women with a Medicaid card; and children who have applied for Medicaid or [place] Health Choice, but were declined, whose parents can pay a reduced fee at time of service.  [place] Adult [facility] Access PROGRAM  [place] [phone] Patients are seen by appointment only. Walk-ins are not accepted. [place] will see patients 80 years of age and older. Monday - Tuesday (8am-5pm) Most Wednesdays (8:30-5pm) $30 per visit, cash only  [facility] Adult Dental Access PROGRAM  [address], [facility] [phone] Patients are seen by appointment only. Walk-ins are not accepted. [place] will see patients 48 years of age and older. One Wednesday Evening (Monthly: Volunteer Based).  $30 per visit, cash only  [place]  [phone] for adults; Children under  age 85, call Graduate Pediatric Dentistry at [phone]. Children aged 28-14, please call [phone] to request a pediatric application.  Dental services are provided in all areas of dental care including fillings, crowns and bridges, complete and partial dentures, implants, gum treatment, root canals, and extractions. Preventive care is also provided. Treatment is provided to both adults and children. Patients are selected via a lottery and there is often a waiting list.   [facility] [address], [place]  [phone] [email or web address]   [facility] [address] [place], [place] [phone], Ext. [phone] Second and Fourth Thursday of each month, opens at 6:30 AM; Clinic ends at 9 AM.  Patients are seen on a first-come first-served basis, and a limited number are seen during each clinic.   [facility]  [address] [place], [place] [phone]   Eligibility Requirements You must have lived in [place], [place], or [place] counties for at least the last three months.   You cannot be eligible for state or federal sponsored [employer], including [employer], [place], or [employer].   You generally cannot be eligible for healthcare insurance through your employer.    How to apply: Eligibility screenings are held every  Tuesday and Wednesday afternoon from 1:00 pm until 4:00 pm. You do not need an appointment for the interview!  [facility] [address] [place], [place]   [place]  [place] Department  [place]  [phone]    Behavioral Health Resources in the Community: Intensive Outpatient Programs Organization         Address  Phone  Notes  [place]. [address], [place], [place] [phone]   [facility] Outpatient [address] [place], [place]   ADS: Alcohol & Drug Svcs [address] [place], [place]   [place] [address],  [place], [place] or [phone]   Substance Abuse Resources Organization         Address  Phone  Notes  Alcohol and Drug Services  [phone]   [place]  [phone]   [facility] [place]   Chinita Pester  [phone]   Residential & Outpatient Substance Abuse Program  [phone]   Psychological Services Organization         Address  Phone  Notes  [facility] [place]  [place]  [phone]   [place] [address], [place] or [phone]    Mobile Crisis Teams Organization         Address  Phone  Notes  [facility], Mobile Crisis Care Unit  [phone]   [facility]  [address] [place], [place]   [place] [address] [place] [phone]    Self-Help/Support Groups Organization         Address  Phone             Notes  [place]. of [place] - variety of support groups  [place] Call for more information  Narcotics Anonymous (NA), [facility] [address], [employer] [place]  2 meetings at this location    Special educational needs teacher         Address  Phone  Notes  [facility]idential [facility] [phone]  [address],    [place]  [phone]   [facility]  [address], [place] 686168, [place], [place]   [place], [place] [phone] Admissions: 8am-3pm M-F  Incentives Substance [place] [address],    [place], [place] [phone]   [facility] [address] [employer], [place], [place]   [facility] [address],  [place], [place]   [facility] Programs - Intensive Outpatient [address] [place], [place], [place]   [facility] ([place].,  [place], [place] [phone] or [phone]   [facility]idential Treatment Services (RTS) [address] [place], [place] Accepts Medicaid  [place] [address]  [place] [phone] Substance Abuse/Addiction Treatment   [facility] Organization         Address  Phone  Notes  [facility]  [phone]   Domenic Schwab, PhD [address] [place], [place]   [phone] or [phone]   [place], [place] [phone]   [facility] [phone] [place], [place] [phone] Insurance/Medicaid/sponsorship through [facility] and Families [address]                                    [place], [place] [phone] [place] [address], [place] [phone]    Dr. Adele Schilder  [phone]   [facility] Dept. 1) [address] [place] 2) [place] 3)  [place] [address], [place] [phone]  [phone]   [place] [phone] or [phone] (After  Hours)

## 2015-01-06 NOTE — ED Notes (Signed)
Patient vomited x1

## 2015-01-06 NOTE — ED Notes (Signed)
Patient eating lunch.  Sitter at bedside.  Patient reports feeling "loop."  No agitation at this time.  Patient taking PO medications without difficulty.

## 2015-01-06 NOTE — ED Notes (Signed)
EDP Plunkett completed Notice of Commitment Change to rescind earlier IVC.  Document complete and placed in patient's chart for medical records.  Patient denies hallucinations - audio or visual.  Patient denies SI/HI.  Patient provided her husband's phone number.  Patient's husband refused to come get her.  I spoke with patient's daughter because husband gave her the phone.  Patient's daughter states patient lives alone and is "always drunk."  Daughter refused to come get patient as well.

## 2015-01-06 NOTE — ED Notes (Signed)
Zofran given for nausea.

## 2015-01-06 NOTE — ED Notes (Signed)
She has just been brought back to my hall bed.  She is drowsy and is in the presence of a sitter Garment/textile technologist).  Her skin is normal, warm and dry and she is breathing normally.

## 2015-01-06 NOTE — ED Notes (Signed)
Pt belongings bags were placed behind nurses station in 9-12 by tcu nurse.

## 2015-01-06 NOTE — ED Notes (Signed)
Pt belongings in [place] 31

## 2015-01-06 NOTE — ED Notes (Signed)
Pt come out to desk to call husband, no answer, she become upset and through the urine container with urine cup full of urine at [employer], [facility] then proceeded to yell. Pt at this time will be IVC'd due to behavior by Dr Omar Person, pt loud and calling staff names, Geodon IM given, she would like to call [employer] to file a report on staff. Off duty [employer] notified.
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
## 2015-01-06 NOTE — ED Notes (Addendum)
Went in to assist RN with patient.  Patient was cursing and stating that she will beat our ass.  Security was call and is now with patient.

## 2015-01-06 NOTE — ED Notes (Signed)
Patient started vomiting and did not keep gabapentin down.
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
## 2015-01-06 NOTE — ED Notes (Addendum)
Pt from home via [facility] wanting detox from alcohol and she reports that her neigbor called EMS because she fell outside.Right front tooth  Broken. Denies LOC. Per EMS patient reports having about 50 beers this am. Per EMS patient's room was covered in beer cans.

## 2015-01-06 NOTE — ED Provider Notes (Signed)
Patient is now sober. She denies suicidal or homicidal ideation. She just once to go home and is not interested in inpatient detox. IVC rescinded and patient discharged home  Blanchie Dessert, MD 01/06/15 2215

## 2015-01-06 NOTE — ED Notes (Signed)
Patient states Natalie Mcdaniel, her husband's son's girlfriend is going to come get her.

## 2015-01-06 NOTE — ED Notes (Signed)
White sneakers, blue and pink pants, black coat, light blue shirt. Coach wallet with cards, DL, 4 dollars and ear phones in it... All of it is inside the brown purse... [place] 31.

## 2015-01-06 NOTE — ED Notes (Signed)
Patient used a walker with which to ambulate.  Cecille Rubin [facility], contacted and she will bring a cab voucher to ED so patient can get home.

## 2015-01-06 NOTE — ED Notes (Signed)
Pt wants to go home .

## 2015-03-01 ENCOUNTER — Encounter (HOSPITAL_COMMUNITY): Payer: Self-pay | Admitting: Emergency Medicine

## 2015-03-01 ENCOUNTER — Inpatient Hospital Stay (HOSPITAL_COMMUNITY)
Admission: EM | Admit: 2015-03-01 | Discharge: 2015-03-05 | DRG: 602 | Disposition: A | Discharge status: Skilled Nursing Facility | Payer: Managed Care, Other (non HMO) | Attending: Internal Medicine | Admitting: Internal Medicine

## 2015-03-01 DIAGNOSIS — F10239 Alcohol dependence with withdrawal, unspecified: Secondary | ICD-10-CM | POA: Diagnosis present

## 2015-03-01 DIAGNOSIS — L8990 Pressure ulcer of unspecified site, unspecified stage: Secondary | ICD-10-CM | POA: Diagnosis present

## 2015-03-01 DIAGNOSIS — G629 Polyneuropathy, unspecified: Secondary | ICD-10-CM

## 2015-03-01 DIAGNOSIS — F10230 Alcohol dependence with withdrawal, uncomplicated: Secondary | ICD-10-CM | POA: Diagnosis not present

## 2015-03-01 DIAGNOSIS — F1020 Alcohol dependence, uncomplicated: Secondary | ICD-10-CM | POA: Diagnosis present

## 2015-03-01 DIAGNOSIS — R740 Nonspecific elevation of levels of transaminase and lactic acid dehydrogenase [LDH]: Secondary | ICD-10-CM | POA: Diagnosis present

## 2015-03-01 DIAGNOSIS — E871 Hypo-osmolality and hyponatremia: Secondary | ICD-10-CM | POA: Diagnosis present

## 2015-03-01 DIAGNOSIS — R7401 Elevation of levels of liver transaminase levels: Secondary | ICD-10-CM | POA: Diagnosis present

## 2015-03-01 DIAGNOSIS — Z599 Problem related to housing and economic circumstances, unspecified: Secondary | ICD-10-CM

## 2015-03-01 DIAGNOSIS — L989 Disorder of the skin and subcutaneous tissue, unspecified: Secondary | ICD-10-CM | POA: Insufficient documentation

## 2015-03-01 DIAGNOSIS — IMO0001 Reserved for inherently not codable concepts without codable children: Secondary | ICD-10-CM | POA: Diagnosis present

## 2015-03-01 DIAGNOSIS — R532 Functional quadriplegia: Secondary | ICD-10-CM | POA: Diagnosis present

## 2015-03-01 DIAGNOSIS — Z811 Family history of alcohol abuse and dependence: Secondary | ICD-10-CM

## 2015-03-01 DIAGNOSIS — F329 Major depressive disorder, single episode, unspecified: Secondary | ICD-10-CM | POA: Diagnosis present

## 2015-03-01 DIAGNOSIS — L0390 Cellulitis, unspecified: Secondary | ICD-10-CM | POA: Diagnosis present

## 2015-03-01 DIAGNOSIS — L03317 Cellulitis of buttock: Secondary | ICD-10-CM | POA: Diagnosis not present

## 2015-03-01 DIAGNOSIS — F419 Anxiety disorder, unspecified: Secondary | ICD-10-CM | POA: Diagnosis present

## 2015-03-01 DIAGNOSIS — Z79899 Other long term (current) drug therapy: Secondary | ICD-10-CM

## 2015-03-01 DIAGNOSIS — F1010 Alcohol abuse, uncomplicated: Secondary | ICD-10-CM

## 2015-03-01 DIAGNOSIS — Z8249 Family history of ischemic heart disease and other diseases of the circulatory system: Secondary | ICD-10-CM

## 2015-03-01 DIAGNOSIS — L89319 Pressure ulcer of right buttock, unspecified stage: Secondary | ICD-10-CM | POA: Diagnosis present

## 2015-03-01 DIAGNOSIS — G621 Alcoholic polyneuropathy: Secondary | ICD-10-CM | POA: Diagnosis present

## 2015-03-01 DIAGNOSIS — Z6825 Body mass index (BMI) 25.0-25.9, adult: Secondary | ICD-10-CM

## 2015-03-01 DIAGNOSIS — L299 Pruritus, unspecified: Secondary | ICD-10-CM | POA: Diagnosis present

## 2015-03-01 DIAGNOSIS — Z801 Family history of malignant neoplasm of trachea, bronchus and lung: Secondary | ICD-10-CM

## 2015-03-01 DIAGNOSIS — L89329 Pressure ulcer of left buttock, unspecified stage: Secondary | ICD-10-CM | POA: Diagnosis present

## 2015-03-01 DIAGNOSIS — E46 Unspecified protein-calorie malnutrition: Secondary | ICD-10-CM | POA: Diagnosis present

## 2015-03-01 DIAGNOSIS — F1994 Other psychoactive substance use, unspecified with psychoactive substance-induced mood disorder: Secondary | ICD-10-CM | POA: Diagnosis present

## 2015-03-01 DIAGNOSIS — Z91030 Bee allergy status: Secondary | ICD-10-CM

## 2015-03-01 DIAGNOSIS — Z9884 Bariatric surgery status: Secondary | ICD-10-CM

## 2015-03-01 HISTORY — DX: Anemia, unspecified: D64.9

## 2015-03-01 LAB — COMPREHENSIVE METABOLIC PANEL
ALT: 40 U/L — ABNORMAL HIGH (ref 0–35)
AST: 120 U/L — ABNORMAL HIGH (ref 0–37)
Albumin: 3.9 g/dL (ref 3.5–5.2)
Alkaline Phosphatase: 103 U/L (ref 39–117)
Anion gap: 13 (ref 5–15)
BUN: 9 mg/dL (ref 6–23)
CO2: 24 mmol/L (ref 19–32)
Calcium: 9.3 mg/dL (ref 8.4–10.5)
Chloride: 96 mmol/L (ref 96–112)
Creatinine, Ser: 0.44 mg/dL — ABNORMAL LOW (ref 0.50–1.10)
GFR calc Af Amer: >90 mL/min (ref >90)
GFR calc non Af Amer: >90 mL/min (ref >90)
Glucose, Bld: 76 mg/dL (ref 70–99)
Potassium: 3.9 mmol/L (ref 3.5–5.1)
Sodium: 133 mmol/L — ABNORMAL LOW (ref 135–145)
Total Bilirubin: 0.8 mg/dL (ref 0.3–1.2)
Total Protein: 7.7 g/dL (ref 6.0–8.3)

## 2015-03-01 LAB — VITAMIN B12: Vitamin B-12: 365 pg/mL (ref 211–911)

## 2015-03-01 LAB — CBC WITH DIFFERENTIAL/PLATELET
Basophils Absolute: 0 10*3/uL (ref 0.0–0.1)
Basophils Relative: 0 % (ref 0–1)
Eosinophils Absolute: 0.1 10*3/uL (ref 0.0–0.7)
Eosinophils Relative: 2 % (ref 0–5)
HCT: 35.8 % — ABNORMAL LOW (ref 36.0–46.0)
Hemoglobin: 12.3 g/dL (ref 12.0–15.0)
Lymphocytes Relative: 25 % (ref 12–46)
Lymphs Abs: 1.3 10*3/uL (ref 0.7–4.0)
MCH: 32 pg (ref 26.0–34.0)
MCHC: 34.4 g/dL (ref 30.0–36.0)
MCV: 93.2 fL (ref 78.0–100.0)
Monocytes Absolute: 0.7 10*3/uL (ref 0.1–1.0)
Monocytes Relative: 13 % — ABNORMAL HIGH (ref 3–12)
Neutro Abs: 3.1 10*3/uL (ref 1.7–7.7)
Neutrophils Relative %: 60 % (ref 43–77)
Platelets: 138 10*3/uL — ABNORMAL LOW (ref 150–400)
RBC: 3.84 MIL/uL — ABNORMAL LOW (ref 3.87–5.11)
RDW: 14.3 % (ref 11.5–15.5)
WBC: 5.3 10*3/uL (ref 4.0–10.5)

## 2015-03-01 LAB — ETHANOL: ALCOHOL ETHYL (B): 41 mg/dL — AB (ref 0–9)

## 2015-03-01 LAB — TSH: TSH: 2.252 u[IU]/mL (ref 0.350–4.500)

## 2015-03-01 MED ORDER — AMOXICILLIN-POT CLAVULANATE 875-125 MG PO TABS
1.0000 | ORAL_TABLET | Freq: Two times a day (BID) | ORAL | Status: DC
  Administered 2015-03-01 – 2015-03-05 (×8): 1 via ORAL
  Filled 2015-03-01 (×9): qty 1

## 2015-03-01 MED ORDER — VITAMIN B-1 100 MG PO TABS
100.0000 mg | ORAL_TABLET | Freq: Every day | ORAL | Status: DC
  Administered 2015-03-01 – 2015-03-05 (×5): 100 mg via ORAL
  Filled 2015-03-01 (×5): qty 1

## 2015-03-01 MED ORDER — THIAMINE HCL 100 MG/ML IJ SOLN
100.0000 mg | Freq: Every day | INTRAMUSCULAR | Status: DC
  Filled 2015-03-01 (×4): qty 1

## 2015-03-01 MED ORDER — HEPARIN SODIUM (PORCINE) 5000 UNIT/ML IJ SOLN
5000.0000 [IU] | Freq: Three times a day (TID) | INTRAMUSCULAR | Status: DC
  Administered 2015-03-01 – 2015-03-05 (×10): 5000 [IU] via SUBCUTANEOUS
  Filled 2015-03-01 (×14): qty 1

## 2015-03-01 MED ORDER — GABAPENTIN 100 MG PO CAPS
100.0000 mg | ORAL_CAPSULE | Freq: Three times a day (TID) | ORAL | Status: DC
  Administered 2015-03-01 – 2015-03-02 (×2): 100 mg via ORAL
  Filled 2015-03-01 (×5): qty 1

## 2015-03-01 MED ORDER — SODIUM CHLORIDE 0.9 % IV BOLUS (SEPSIS)
500.0000 mL | Freq: Once | INTRAVENOUS | Status: AC
  Administered 2015-03-01: 500 mL via INTRAVENOUS

## 2015-03-01 MED ORDER — SODIUM CHLORIDE 0.9 % IJ SOLN
3.0000 mL | Freq: Two times a day (BID) | INTRAMUSCULAR | Status: DC
  Administered 2015-03-01 – 2015-03-02 (×3): 3 mL via INTRAVENOUS

## 2015-03-01 MED ORDER — IBUPROFEN 200 MG PO TABS
400.0000 mg | ORAL_TABLET | Freq: Once | ORAL | Status: AC
  Administered 2015-03-01: 400 mg via ORAL
  Filled 2015-03-01: qty 2

## 2015-03-01 MED ORDER — ONDANSETRON HCL 4 MG PO TABS
4.0000 mg | ORAL_TABLET | Freq: Four times a day (QID) | ORAL | Status: DC | PRN
  Administered 2015-03-02: 4 mg via ORAL
  Filled 2015-03-01: qty 1

## 2015-03-01 MED ORDER — CHLORHEXIDINE GLUCONATE 0.12 % MT SOLN
15.0000 mL | Freq: Two times a day (BID) | OROMUCOSAL | Status: DC
  Administered 2015-03-02 – 2015-03-04 (×6): 15 mL via OROMUCOSAL
  Filled 2015-03-01 (×10): qty 15

## 2015-03-01 MED ORDER — LORAZEPAM 1 MG PO TABS
0.0000 mg | ORAL_TABLET | Freq: Two times a day (BID) | ORAL | Status: DC
  Administered 2015-03-03 – 2015-03-04 (×3): 2 mg via ORAL
  Filled 2015-03-01 (×3): qty 2

## 2015-03-01 MED ORDER — ACETAMINOPHEN 325 MG PO TABS
650.0000 mg | ORAL_TABLET | Freq: Four times a day (QID) | ORAL | Status: DC | PRN
Start: 2015-03-01 — End: 2015-03-05

## 2015-03-01 MED ORDER — ONDANSETRON HCL 4 MG/2ML IJ SOLN: 4.0000 mg | Freq: Four times a day (QID) | INTRAMUSCULAR | Status: DC | PRN

## 2015-03-01 MED ORDER — ACETAMINOPHEN 650 MG RE SUPP: 650.0000 mg | Freq: Four times a day (QID) | RECTAL | Status: DC | PRN

## 2015-03-01 MED ORDER — SODIUM CHLORIDE 0.9 % IV SOLN: 250.0000 mL | INTRAVENOUS | Status: DC | PRN

## 2015-03-01 MED ORDER — HYDROCODONE-ACETAMINOPHEN 5-325 MG PO TABS
1.0000 | ORAL_TABLET | Freq: Four times a day (QID) | ORAL | Status: DC | PRN
  Administered 2015-03-01 (×2): 1 via ORAL
  Filled 2015-03-01 (×3): qty 1

## 2015-03-01 MED ORDER — ALUM & MAG HYDROXIDE-SIMETH 200-200-20 MG/5ML PO SUSP: 30.0000 mL | Freq: Four times a day (QID) | ORAL | Status: DC | PRN

## 2015-03-01 MED ORDER — SODIUM CHLORIDE 0.9 % IJ SOLN: 3.0000 mL | INTRAMUSCULAR | Status: DC | PRN

## 2015-03-01 MED ORDER — LORAZEPAM 1 MG PO TABS
0.0000 mg | ORAL_TABLET | Freq: Four times a day (QID) | ORAL | Status: AC
  Administered 2015-03-01: 2 mg via ORAL
  Administered 2015-03-01 (×2): 1 mg via ORAL
  Administered 2015-03-02 – 2015-03-03 (×5): 2 mg via ORAL
  Filled 2015-03-01 (×3): qty 2
  Filled 2015-03-01: qty 1
  Filled 2015-03-01: qty 2
  Filled 2015-03-01: qty 1
  Filled 2015-03-01 (×2): qty 2

## 2015-03-01 MED ORDER — CETYLPYRIDINIUM CHLORIDE 0.05 % MT LIQD
7.0000 mL | Freq: Two times a day (BID) | OROMUCOSAL | Status: DC
  Administered 2015-03-02 – 2015-03-05 (×7): 7 mL via OROMUCOSAL
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
## 2015-03-01 NOTE — ED Notes (Addendum)
Temp 99 blanket X 3 removed

## 2015-03-01 NOTE — Progress Notes (Signed)
Pt arrived to unit via stretcher. Able to verbalize needs. Oriented x4. Patient oriented to call light, room. No questions or concerns at this time.

## 2015-03-01 NOTE — ED Notes (Signed)
Per EMS: EMS arrived to find pt laying in bed on her rt side.  States that she has been "unable to get out of bed for 6 months due to peripheral neuropathy".  Pt's neighbors state that pt drinks every day and will not get out of bed.  Pt c/o bedsore to buttocks.  Pt states she cannot walk however, she walked to stretcher.  Pt drinking beer upon EMS arrival.

## 2015-03-01 NOTE — ED Notes (Addendum)
Pt c/o of generalized weakness of lower ext. With numbness and tingling. Pt weakness is equal. Pt able to lift legs off bed with effort. Pt states she has a walker at home. Pt states she lives at home alone.
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
## 2015-03-02 DIAGNOSIS — F1099 Alcohol use, unspecified with unspecified alcohol-induced disorder: Secondary | ICD-10-CM

## 2015-03-02 DIAGNOSIS — F10230 Alcohol dependence with withdrawal, uncomplicated: Secondary | ICD-10-CM | POA: Diagnosis not present

## 2015-03-02 DIAGNOSIS — G629 Polyneuropathy, unspecified: Secondary | ICD-10-CM | POA: Diagnosis not present

## 2015-03-02 DIAGNOSIS — L989 Disorder of the skin and subcutaneous tissue, unspecified: Secondary | ICD-10-CM

## 2015-03-02 DIAGNOSIS — F1994 Other psychoactive substance use, unspecified with psychoactive substance-induced mood disorder: Secondary | ICD-10-CM | POA: Diagnosis not present

## 2015-03-02 DIAGNOSIS — L8990 Pressure ulcer of unspecified site, unspecified stage: Secondary | ICD-10-CM | POA: Diagnosis not present

## 2015-03-02 DIAGNOSIS — L03317 Cellulitis of buttock: Secondary | ICD-10-CM | POA: Diagnosis not present

## 2015-03-02 LAB — BASIC METABOLIC PANEL
ANION GAP: 15 (ref 5–15)
BUN: 15 mg/dL (ref 6–23)
CHLORIDE: 95 mmol/L — AB (ref 96–112)
CO2: 27 mmol/L (ref 19–32)
CREATININE: 0.59 mg/dL (ref 0.50–1.10)
Calcium: 10.1 mg/dL (ref 8.4–10.5)
GFR calc non Af Amer: >90 mL/min (ref >90)
Glucose, Bld: 85 mg/dL (ref 70–99)
POTASSIUM: 4.1 mmol/L (ref 3.5–5.1)
Sodium: 137 mmol/L (ref 135–145)

## 2015-03-02 MED ORDER — DULOXETINE HCL 30 MG PO CPEP
30.0000 mg | ORAL_CAPSULE | Freq: Every day | ORAL | Status: DC
Start: 2015-03-02 — End: 2015-03-05
  Administered 2015-03-02 – 2015-03-05 (×4): 30 mg via ORAL
  Filled 2015-03-02 (×4): qty 1

## 2015-03-02 MED ORDER — GABAPENTIN 100 MG PO CAPS
200.0000 mg | ORAL_CAPSULE | Freq: Three times a day (TID) | ORAL | Status: DC
  Administered 2015-03-02 – 2015-03-05 (×9): 200 mg via ORAL
  Filled 2015-03-02 (×11): qty 2

## 2015-03-02 MED ORDER — TRAZODONE HCL 50 MG PO TABS
50.0000 mg | ORAL_TABLET | Freq: Every day | ORAL | Status: DC
  Administered 2015-03-02 – 2015-03-04 (×3): 50 mg via ORAL
  Filled 2015-03-02 (×5): qty 1

## 2015-03-02 MED ORDER — ADULT MULTIVITAMIN W/MINERALS CH
1.0000 | ORAL_TABLET | Freq: Every day | ORAL | Status: DC
Start: 2015-03-02 — End: 2015-03-05
  Administered 2015-03-02 – 2015-03-05 (×4): 1 via ORAL
  Filled 2015-03-02 (×4): qty 1

## 2015-03-02 MED ORDER — PRO-STAT SUGAR FREE PO LIQD
30.0000 mL | Freq: Two times a day (BID) | ORAL | Status: DC
Start: 2015-03-02 — End: 2015-03-05
  Administered 2015-03-02 – 2015-03-05 (×6): 30 mL via ORAL
  Filled 2015-03-02 (×8): qty 30

## 2015-03-02 MED ORDER — ENSURE COMPLETE PO LIQD
237.0000 mL | Freq: Two times a day (BID) | ORAL | Status: DC
  Administered 2015-03-02 – 2015-03-03 (×3): 237 mL via ORAL
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
## 2015-03-02 NOTE — Progress Notes (Signed)
UR complete 

## 2015-03-02 NOTE — Progress Notes (Addendum)
Clinical Social Work Department CLINICAL SOCIAL WORK PLACEMENT NOTE 03/02/2015  Patient:  ALLEAN, MONTFORT  Account Number:  [id number] Admit date:  03/01/2015  Clinical Social Worker:  Sindy Messing, LCSW  Date/time:  03/02/2015 03:00 PM  Clinical Social Work is seeking post-discharge placement for this patient at the following level of care:   [place]   (*CSW will update this form in Epic as items are completed)   03/02/2015  Patient/family provided with [facility] Department of Clinical Social Work's list of facilities offering this level of care within the geographic area requested by the patient (or if unable, by the patient's family).  03/02/2015  Patient/family informed of their freedom to choose among providers that offer the needed level of care, that participate in Medicare, Medicaid or managed care program needed by the patient, have an available bed and are willing to accept the patient.  03/02/2015  Patient/family informed of [facility]' ownership interest in [facility], as well as of the fact that they are under no obligation to receive care at this facility.  PASARR submitted to EDS on 03/02/2015 PASARR number received on 03/02/2015  FL2 transmitted to all facilities in geographic area requested by pt/family on  03/02/2015 FL2 transmitted to all facilities within larger geographic area on   Patient informed that his/her managed care company has contracts with or will negotiate with  certain facilities, including the following:     Patient/family informed of bed offers received:  03/04/15 Patient chooses bed at [facility] Physician recommends and patient chooses bed at    Patient to be transferred to [facility] on  03/05/15 Patient to be transferred to facility by [facility] Patient and family notified of transfer on 03/05/15 Name of family member notified:  Husband-Rafeal  The following physician request were entered in Epic:   Additional  Comments:   03/02/15- Additional clinicals faxed to [facility] for Export authorization

## 2015-03-02 NOTE — Progress Notes (Signed)
Progress Note   Natalie Mcdaniel BLT:[id number] DOB: December 21, 1966 DOA: 03/01/2015 PCP: Harrison Mons, PA-C   Brief Narrative:   Natalie Mcdaniel is an 49 y.o. 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  Assessment/Plan:   Principal Problem:  Cellulitis / moisture associated skin damage versus decubitus ulcer - Uncomplicated, nonpustular, patient nontoxic appearing so being treated with Augmentin 3 times a day. - Wound care RN to evaluate skin erosions related to moisture associated damage from urinary incontinence. - Wound care per nursing staff.  Active Problems:  Functional quadriplegia - B 12 level WNL, RBC folate pending. TSH 2.252.  - PT evaluation.   Substance induced mood disorder - Psychiatry consultation performed.  Cymbalta and Trazodone resumed.  - Outpatient treatment recommended.   Neuropathy - Likely from nutritional deficiency related to alcoholism. - Dietician consulted. - Continue neurontin, increase to 200 mg TID.   Alcohol dependence with uncomplicated withdrawal - Placed on Ativan detox CIWA protocol. - Supplement thiamine and folic acid. - SW consult. Psych consult.   Hyponatremia - Likely from your drinkers Potomania. Mild.   Transaminitis - Likely from liver inflammation secondary to alcohol abuse.   DVT prophylaxis - Subcutaneous heparin ordered.  Code Status: Full. Family Communication: Natalie Mcdaniel (husband) [phone] is emergency contact.  No family at bedside. Disposition Plan: Home vs. SNF when stable.   IV Access:    Peripheral IV   Procedures and diagnostic studies:   No results found.   Medical Consultants:    Ambrose Finland, MD, Psychiatry  Anti-Infectives:    Augmentin 03/01/15--->  Subjective:   Natalie Mcdaniel is still having LE pain.  No complaints of tremulousness or siginificant anxiety.  No nausea or vomiting.  Appetite improving.  Objective:    Filed Vitals:   03/01/15 1534 03/01/15 1625 03/01/15 2233 03/02/15 0535  BP: 126/74 124/65 136/81 141/83  Pulse: 74 84 76 73  Temp: 99 F (37.2 C) 98.7 F (37.1 C) 98.6 F (37 C) 98.3 F (36.8 C)  TempSrc: Oral Oral Oral Oral  Resp: 14 16 18 16   Height:  5\' 6"  (1.676 m)    Weight:  70.67 kg (155 lb 12.8 oz)    SpO2: 98% 99% 99% 100%    Intake/Output Summary (Last 24 hours) at 03/02/15 1200 Last data filed at 03/01/15 1531  Gross per 24 hour  Intake    750 ml  Output      0 ml  Net    750 ml    Exam: Gen:  NAD Cardiovascular:  RRR, No M/R/G Respiratory:  Lungs CTAB Gastrointestinal:  Abdomen soft, NT/ND, + BS Extremities:  No C/E/C   Data Reviewed:    Labs: Basic Metabolic Panel:  Recent Labs Lab 03/01/15 1322 03/02/15 0600  NA 133* 137  K 3.9 4.1  CL 96 95*  CO2 24 27  GLUCOSE 76 85  BUN 9 15  CREATININE 0.44* 0.59  CALCIUM 9.3 10.1   GFR Estimated Creatinine Clearance: 78.8 mL/min (by C-G formula based on Cr of 0.59). Liver Function Tests:  Recent Labs Lab 03/01/15 1322  AST 120*  ALT 40*  ALKPHOS 103  BILITOT 0.8  PROT 7.7  ALBUMIN 3.9   CBC:  Recent Labs Lab 03/01/15 1322  WBC 5.3  NEUTROABS 3.1  HGB 12.3  HCT 35.8*  MCV  93.2  PLT 138*   Thyroid function studies:  Recent Labs  03/01/15 1610  TSH 2.252   Anemia work up:  Recent Labs  03/01/15 1610  VITAMINB12 365   Sepsis Labs:  Recent Labs Lab 03/01/15 1322  WBC 5.3   Microbiology No results found for this or any previous visit (from the past 240 hour(s)).   Medications:   . amoxicillin-clavulanate  1 tablet Oral Q12H  . antiseptic oral rinse  7 mL Mouth Rinse q12n4p  . chlorhexidine  15 mL Mouth Rinse BID  . DULoxetine  30 mg Oral Daily  . gabapentin  200 mg Oral TID  . heparin  5,000 Units Subcutaneous  3 times per day  . LORazepam  0-4 mg Oral 4 times per day   Followed by  . [START ON 03/03/2015] LORazepam  0-4 mg Oral Q12H  . sodium chloride  3 mL Intravenous Q12H  . thiamine  100 mg Oral Daily   Or  . thiamine  100 mg Intravenous Daily  . traZODone  50 mg Oral QHS   Continuous Infusions:   Time spent: 25 minutes.   LOS: 1 day   RAMA,CHRISTINA  [facility] Pager [phone]. If unable to reach me by pager, please call my cell phone at [phone].  *Please refer to [email or web address], password [email or web address] to get updated schedule on who will round on this patient, as hospitalists switch teams weekly. If 7PM-7AM, please contact night-coverage at [email or web address], password [email or web address] for any overnight needs.  03/02/2015, 12:00 PM

## 2015-03-02 NOTE — Progress Notes (Signed)
Clinical Social Work Department CLINICAL SOCIAL WORK PSYCHIATRY SERVICE LINE ASSESSMENT 03/02/2015  Patient:  Natalie Mcdaniel  Account:  [id number]  [place] Date:  03/01/2015  Clinical Social Worker:  Sindy Messing, LCSW  Date/Time:  03/02/2015 11:30 AM Referred by:  Physician  Date referred:  03/02/2015 Reason for Referral  Psychosocial assessment   Presenting Symptoms/Problems (In the person's/family's own words):   Psych consulted due to substance use.   Abuse/Neglect/Trauma History (check all that apply)  Denies history   Abuse/Neglect/Trauma Comments:   Psychiatric History (check all that apply)  Outpatient treatment  Inpatient/hospitilization   Psychiatric medications:  Cymbalta 30 mg  Ativan 0-4 mg  Trazodone 50 mg   Current Mental Health Hospitalizations/Previous Mental Health History:   Patient has been diagnosed with depression in the past and feels her depression is directly related to substance use and strain it places on her relationships.   Current provider:   Patient had been to [facility] in the past but no current treatment.   Place and Date:   N/A   Current Medications:   Scheduled Meds:      . amoxicillin-clavulanate  1 tablet Oral Q12H  . antiseptic oral rinse  7 mL Mouth Rinse q12n4p  . chlorhexidine  15 mL Mouth Rinse BID  . DULoxetine  30 mg Oral Daily  . gabapentin  200 mg Oral TID  . heparin  5,000 Units Subcutaneous 3 times per day  . LORazepam  0-4 mg Oral 4 times per day   Followed by     . [START ON 03/03/2015] LORazepam  0-4 mg Oral Q12H  . sodium chloride  3 mL Intravenous Q12H  . thiamine  100 mg Oral Daily   Or     . thiamine  100 mg Intravenous Daily  . traZODone  50 mg Oral QHS        Continuous Infusions:      PRN Meds:.sodium chloride, acetaminophen **OR** acetaminophen, alum & mag hydroxide-simeth, HYDROcodone-acetaminophen, ondansetron **OR** ondansetron (ZOFRAN) IV, sodium chloride       Previous Impatient  Admission/Date/Reason:   Patient was at [facility] for detox in December 2015.   Emotional Health / Current Symptoms    Suicide/Self Harm  None reported   Suicide attempt in the past:   Patient denies any SI or HI.   Other harmful behavior:   None reported   Psychotic/Dissociative Symptoms  None reported   Other Psychotic/Dissociative Symptoms:   N/A    Attention/Behavioral Symptoms  Within Normal Limits   Other Attention / Behavioral Symptoms:   Patient engaged during assessment but reports she has been sleepy due to pain meds.    Cognitive Impairment  Within Normal Limits   Other Cognitive Impairment:   Patient alert and oriented during assessment.    Mood and Adjustment  Flat    Stress, Anxiety, Trauma, Any Recent Loss/Stressor  Relationship   Anxiety (frequency):   N/A   Phobia (specify):   N/A   Compulsive behavior (specify):   N/A   Obsessive behavior (specify):   N/A   Other:   Patient reports strained relationship with family due to alcohol abuse.   Substance Abuse/Use  Current substance use   SBIRT completed (please refer for detailed history):  Y  Self-reported substance use:   Patient reports she has been drinking for about 4 days. Patient reports that she drinks about 24 beers a day and drinks vodka as well. Patient reports she has been for detox and  intensive outpatient (IOP) in the past as well. Patient is interested in treatment once she can improve her physical health.   Urinary Drug Screen Completed:  Y Alcohol level:   41    Environmental/Housing/Living Arrangement  Stable housing   Who is in the home:   Husband   Emergency contact:  [place]   Patient's Strengths and Goals (patient's own words):   Patient reports she is motivated to stay sober. Patient has participated in treatment in the past.   Clinical Social Worker's Interpretive Summary:   CSW received referral in order to  complete psychosocial assessment. CSW reviewed chart and met with patient at bedside. CSW introduced myself and explained role.    Patient reports she was living at home with her husband prior to admission. Patient has two children and a step child but reports poor relationships with family at this time due to alcohol abuse. Patient started drinking about 4 years ago after losing her job. Patient reports she has not worked since then and has become isolated and depressed. Patient sought treatment in the past for MH and SA but is not currently active in any treatment. Patient reports that due to alcohol use she is unable to get out of bed and has become very weak.    CSW spoke with patient about SA treatment but patient is not able to participate in SA treatment until she is physically better. Patient reports that she has never been to SNF but understands that PT is recommending a ST SNF stay so that she can regain her strength. Patient is agreeable for [facility] search and asked CSW to contact her husband. CSW spoke with husband and answered questions re: SNF. Husband confirmed information about strain on relationship and is hopeful that patient will follow up with SA treatment after DC from SNF.    CSW completed Fl2 and faxed out. CSW will follow up with bed offers.   Disposition:  Recommend Psych CSW continuing to support while in hospital   [place], [place] [phone]

## 2015-03-02 NOTE — Evaluation (Signed)
Physical Therapy Evaluation Patient Details Name: Natalie Mcdaniel MRN: [id number] DOB: 1966-11-22 Today's Date: 03/02/2015   History of Present Illness  Natalie Mcdaniel is an 49 y.o. female with a PMH alcoholism (admits to 24 beers/day intake) and Roux-en-Y gastric bypass who presents with skin irritation of bilateral buttocks from sitting in her own urine due to immobility and pain from bilateral lower extremity peripheral neuropathy.  Clinical Impression  Pt admitted with above diagnosis. Pt currently with functional limitations due to the deficits listed below (see PT Problem List). Pt ambulated 20' with RW and min A for safety/balance. She is alone much of the time at home wehre she's had 8 falls in the past year. She is significantly deconditioned, so SNF recommended for rehab.  Pt will benefit from skilled PT to increase their independence and safety with mobility to allow discharge to the venue listed below.       Follow Up Recommendations SNF    Equipment Recommendations  Other (comment) (transfer tub bench)    Recommendations for Other Services OT consult     Precautions / Restrictions Precautions Precautions: Fall Precaution Comments: 8 falls in past year, most recently fell on stairs just prior to admission Restrictions Weight Bearing Restrictions: No      Mobility  Bed Mobility               General bed mobility comments: pt sitting on EOB at start of eval  Transfers Overall transfer level: Needs assistance Equipment used: Rolling walker (2 wheeled) Transfers: Sit to/from Stand Sit to Stand: From elevated surface;Mod assist         General transfer comment: increased time and effort, cues for hand placement  Ambulation/Gait Ambulation/Gait assistance: Min assist Ambulation Distance (Feet): 20 Feet Assistive device: Rolling walker (2 wheeled) Gait Pattern/deviations: Step-to pattern;Wide base of support;Trunk flexed   Gait velocity interpretation:  Below normal speed for age/gender General Gait Details: distance limited by fatigue, min A for balance/safety due to h/o falls, pt tends to lean to left  Stairs            Wheelchair Mobility    Modified Rankin (Stroke Patients Only)       Balance Overall balance assessment: Needs assistance   Sitting balance-Leahy Scale: Good       Standing balance-Leahy Scale: Poor Standing balance comment: requires BUE support                             Pertinent Vitals/Pain Pain Assessment: No/denies pain    Home Living Family/patient expects to be discharged to:: Private residence Living Arrangements: Spouse/significant other Available Help at Discharge: Available PRN/intermittently (husband is gone most of the time per pt) Type of Home: House Home Access: Stairs to enter Entrance Stairs-Rails: Right;Left;Can reach both Entrance Stairs-Number of Steps: 6 Home Layout: Two level;Able to live on main level with bedroom/bathroom Home Equipment: Walker - 2 wheels;Grab bars - tub/shower      Prior Function Level of Independence: Needs assistance   Gait / Transfers Assistance Needed: short distances with RW, has been declining with mobility recently due to peripheral neuropathy worsening  ADL's / Homemaking Assistance Needed: used RW in tub shower        Hand Dominance        Extremity/Trunk Assessment   Upper Extremity Assessment: Overall WFL for tasks assessed           Lower Extremity Assessment: Generalized weakness (-4/5  knee extension, sensation intact to light touch, pt reports "tingling" in B feet)      Cervical / Trunk Assessment: Kyphotic  Communication   Communication: No difficulties  Cognition Arousal/Alertness: Awake/alert Behavior During Therapy: WFL for tasks assessed/performed Overall Cognitive Status: Within Functional Limits for tasks assessed                      General Comments      Exercises         Assessment/Plan    PT Assessment Patient needs continued PT services  PT Diagnosis Difficulty walking;Generalized weakness   PT Problem List Decreased strength;Decreased activity tolerance;Decreased balance;Decreased mobility;Decreased safety awareness  PT Treatment Interventions Gait training;DME instruction;Functional mobility training;Stair training;Therapeutic activities;Patient/family education;Therapeutic exercise;Balance training   PT Goals (Current goals can be found in the Care Plan section) Acute Rehab PT Goals Patient Stated Goal: to walk better, to get legs stronger PT Goal Formulation: With patient Time For Goal Achievement: 03/16/15 Potential to Achieve Goals: Good    Frequency Min 3X/week   Barriers to discharge Decreased caregiver support      Co-evaluation               End of Session Equipment Utilized During Treatment: Gait belt Activity Tolerance: Patient limited by fatigue Patient left: in chair;with call bell/phone within reach;with chair alarm set Nurse Communication: Mobility status         Time: 6644-0347 PT Time Calculation (min) (ACUTE ONLY): 26 min   Charges:   PT Evaluation $Initial PT Evaluation Tier I: 1 Procedure PT Treatments $Gait Training: 8-22 mins   PT G Codes:        Philomena Doheny 03/02/2015, 10:43 AM [phone]

## 2015-03-02 NOTE — Consult Note (Signed)
WOC wound consult note Reason for Consult: evaluate buttock wounds.  Pt with history of lying in her own urine, unable to walk from neuropathy.  Today she is able to turn over to her side for me to examine her.  Wound type:MASD (moisture associated skin damage) bilateral buttocks, worse on the right than left Measurement:right buttock 3.5cm x 5cm x 0.1cm; left buttock wound is not really open mostly just slightly macerated  Wound TFT:DDUKG buttock pink, partial thickness, dry now.  Left buttock scattered pink partial thickness openings but very, very superficial  Drainage (amount, consistency, odor) none, dry Periwound:excoriated with apparent scratching from patient, she endorses removing the dressing placed by the nurse to "scratch herself" I have explained the rationale for not scratching this area.  Dressing procedure/placement/frequency: Soft silicone foam to protect and insulate the right buttock wound, barrier cream only for the left buttock.  Antifungal powder for areas that are pruritic.   Discussed POC with patient and bedside nurse.  Re consult if needed, will not follow at this time. Thanks  Donnis Pecha [place], [place] [phone])
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
## 2015-03-03 DIAGNOSIS — F10230 Alcohol dependence with withdrawal, uncomplicated: Secondary | ICD-10-CM | POA: Diagnosis not present

## 2015-03-03 DIAGNOSIS — L03317 Cellulitis of buttock: Secondary | ICD-10-CM | POA: Diagnosis not present

## 2015-03-03 DIAGNOSIS — G629 Polyneuropathy, unspecified: Secondary | ICD-10-CM | POA: Diagnosis not present

## 2015-03-03 DIAGNOSIS — R532 Functional quadriplegia: Secondary | ICD-10-CM | POA: Diagnosis not present

## 2015-03-03 NOTE — Progress Notes (Signed)
Progress Note   Natalie Mcdaniel WJX:[id number] DOB: 12-24-1965 DOA: 03/01/2015 PCP: Harrison Mons, PA-C   Brief Narrative:   Natalie Mcdaniel is an 49 y.o. 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  Assessment/Plan:   Principal Problem:  Cellulitis / moisture associated skin damage versus decubitus ulcer - Uncomplicated, nonpustular, patient nontoxic appearing so being treated with Augmentin 3 times a day. - Wound care RN to evaluate skin erosions related to moisture associated damage from urinary incontinence. - Wound care per nursing staff per wound care nurses recommendations.  Active Problems:  Functional quadriplegia - B 12 level WNL, RBC folate pending. TSH 2.252.  - Status post PT evaluation, SNF recommended.  SW consulted.   Substance induced mood disorder - Psychiatry consultation performed.  Cymbalta and Trazodone resumed.  - Outpatient treatment recommended.   Neuropathy - Likely from nutritional deficiency related to alcoholism. - Dietician consulted. - Continue neurontin, increase to 200 mg TID.   Alcohol dependence with uncomplicated withdrawal - Placed on Ativan detox CIWA protocol. - Supplement thiamine and folic acid. - SW consult. S/P Psych consult.  Outpatient treatment recommended.   Hyponatremia - Likely from your drinkers Potomania. Resolved with abstinence.   Transaminitis - Likely from liver inflammation secondary to alcohol abuse.   DVT prophylaxis - Subcutaneous heparin ordered.  Code Status: Full. Family Communication: Marcelia Petersen (husband) [phone] is emergency contact.  No family at bedside. Disposition Plan: Home vs. SNF when stable.   IV Access:    Peripheral IV   Procedures and diagnostic studies:   No results found.   Medical  Consultants:    Ambrose Finland, MD, Psychiatry  Anti-Infectives:    Augmentin 03/01/15--->  Subjective:   Natalie Mcdaniel is still having LE pain, but reports it has improved.  No complaints of tremulousness or siginificant anxiety.  No nausea or vomiting.  Appetite improving.  Objective:    Filed Vitals:   03/02/15 1216 03/02/15 1804 03/02/15 2147 03/03/15 0626  BP: 119/85 118/75 127/87 133/84  Pulse: 75 89 90 75  Temp: 98 F (36.7 C) 97.8 F (36.6 C) 98.4 F (36.9 C) 98.6 F (37 C)  TempSrc: Oral Oral Oral Oral  Resp: 18 18 18 18   Height:      Weight:      SpO2: 99% 99% 97% 100%    Intake/Output Summary (Last 24 hours) at 03/03/15 0916 Last data filed at 03/03/15 0844  Gross per 24 hour  Intake    120 ml  Output      0 ml  Net    120 ml    Exam: Gen:  NAD, affect brighter Cardiovascular:  RRR, No M/R/G Respiratory:  Lungs CTAB Gastrointestinal:  Abdomen soft, NT/ND, + BS Extremities:  No C/E/C   Data Reviewed:    Labs: Basic Metabolic Panel:  Recent Labs Lab 03/01/15 1322 03/02/15 0600  NA 133* 137  K 3.9 4.1  CL 96 95*  CO2 24 27  GLUCOSE 76 85  BUN 9 15  CREATININE 0.44* 0.59  CALCIUM 9.3 10.1   GFR Estimated Creatinine Clearance: 78.8 mL/min (by C-G formula based on Cr of 0.59). Liver Function Tests:  Recent Labs Lab 03/01/15 1322  AST 120*  ALT 40*  ALKPHOS 103  BILITOT 0.8  PROT 7.7  ALBUMIN 3.9   CBC:  Recent  Labs Lab 03/01/15 1322  WBC 5.3  NEUTROABS 3.1  HGB 12.3  HCT 35.8*  MCV 93.2  PLT 138*   Thyroid function studies:  Recent Labs  03/01/15 1610  TSH 2.252   Anemia work up:  Recent Labs  03/01/15 1610  VITAMINB12 365   Sepsis Labs:  Recent Labs Lab 03/01/15 1322  WBC 5.3   Microbiology No results found for this or any previous visit (from the past 240 hour(s)).   Medications:   . amoxicillin-clavulanate  1 tablet Oral Q12H  . antiseptic oral rinse  7 mL Mouth Rinse q12n4p    . chlorhexidine  15 mL Mouth Rinse BID  . DULoxetine  30 mg Oral Daily  . feeding supplement (ENSURE COMPLETE)  237 mL Oral BID BM  . feeding supplement (PRO-STAT SUGAR FREE 64)  30 mL Oral BID  . gabapentin  200 mg Oral TID  . heparin  5,000 Units Subcutaneous 3 times per day  . LORazepam  0-4 mg Oral Q12H  . multivitamin with minerals  1 tablet Oral Daily  . sodium chloride  3 mL Intravenous Q12H  . thiamine  100 mg Oral Daily   Or  . thiamine  100 mg Intravenous Daily  . traZODone  50 mg Oral QHS   Continuous Infusions:   Time spent: 25 minutes.   LOS: 2 days   RAMA,CHRISTINA  [facility] Pager [phone]. If unable to reach me by pager, please call my cell phone at [phone].  *Please refer to [email or web address], password [email or web address] to get updated schedule on who will round on this patient, as hospitalists switch teams weekly. If 7PM-7AM, please contact night-coverage at [email or web address], password [email or web address] for any overnight needs.  03/03/2015, 9:16 AM

## 2015-03-03 NOTE — Clinical Social Work Note (Signed)
CSW met with pt at bedside to provide SNF bed choices  CSW explained the facilities that could take pt and prompted pt to explore her thoughts and feelings on rehab  Pt chose [facility] for her rehab needs  Pt frustrated that she cannot walk and also feels so tired  CSW provided active and supportive listening and explained SNF protocol  CSW let [place] know that pt had chosen them and sent recent clinicals via carefinderpro system  CSW will continue to follow up with pt until discharge  .Dede Query, LCSW [facility] Clinical Social Worker - Weekend Coverage cell #: [phone]

## 2015-03-04 DIAGNOSIS — L03317 Cellulitis of buttock: Secondary | ICD-10-CM | POA: Diagnosis not present

## 2015-03-04 DIAGNOSIS — R532 Functional quadriplegia: Secondary | ICD-10-CM | POA: Diagnosis not present

## 2015-03-04 DIAGNOSIS — G629 Polyneuropathy, unspecified: Secondary | ICD-10-CM | POA: Diagnosis not present

## 2015-03-04 DIAGNOSIS — F10230 Alcohol dependence with withdrawal, uncomplicated: Secondary | ICD-10-CM | POA: Diagnosis not present

## 2015-03-04 MED ORDER — PRO-STAT SUGAR FREE PO LIQD: 30.0000 mL | Freq: Two times a day (BID) | ORAL | Status: DC

## 2015-03-04 MED ORDER — HYDROCODONE-ACETAMINOPHEN 5-325 MG PO TABS: 1.0000 | ORAL_TABLET | Freq: Four times a day (QID) | ORAL | Status: DC | PRN

## 2015-03-04 MED ORDER — THIAMINE HCL 100 MG PO TABS: 100.0000 mg | ORAL_TABLET | Freq: Every day | ORAL | Status: DC

## 2015-03-04 MED ORDER — AMOXICILLIN-POT CLAVULANATE 875-125 MG PO TABS: 1.0000 | ORAL_TABLET | Freq: Two times a day (BID) | ORAL | Status: AC

## 2015-03-04 MED ORDER — GABAPENTIN 100 MG PO CAPS: 200.0000 mg | ORAL_CAPSULE | Freq: Three times a day (TID) | ORAL | Status: DC

## 2015-03-04 NOTE — Progress Notes (Signed)
Progress Note   Natalie Mcdaniel:[id number] DOB: 13-Dec-1966 DOA: 03/01/2015 PCP: Harrison Mons, PA-C   Brief Narrative:   Natalie Mcdaniel is an 49 y.o. 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  Assessment/Plan:   Principal Problem:  Cellulitis / moisture associated skin damage versus decubitus ulcer - Uncomplicated, nonpustular, patient nontoxic appearing so being treated with Augmentin 3 times a day. - Wound care RN to evaluate skin erosions related to moisture associated damage from urinary incontinence. - Wound care per nursing staff per wound care nurses recommendations.  Active Problems:  Functional quadriplegia - B 12 level WNL, RBC folate pending. TSH 2.252.  - Status post PT evaluation, SNF recommended.  SW consulted.   Substance induced mood disorder - Psychiatry consultation performed.  Cymbalta and Trazodone resumed.  - Outpatient treatment recommended.   Neuropathy - Likely from nutritional deficiency related to alcoholism. - Dietician consulted. - Continue neurontin, increase to 200 mg TID.   Alcohol dependence with uncomplicated withdrawal - Placed on Ativan detox CIWA protocol. - Supplement thiamine and folic acid. - SW consult. S/P Psych consult.  Outpatient treatment recommended.   Hyponatremia - Likely from your drinkers Potomania. Resolved with abstinence.   Transaminitis - Likely from liver inflammation secondary to alcohol abuse.   DVT prophylaxis - Subcutaneous heparin ordered.  Code Status: Full. Family Communication: Evangelina Delancey (husband) [phone] is emergency contact.  No family at bedside. Disposition Plan: Home vs. SNF when stable.   IV Access:    Peripheral IV   Procedures and diagnostic studies:   No results found.   Medical  Consultants:    Ambrose Finland, MD, Psychiatry  Anti-Infectives:    Augmentin 03/01/15--->  Subjective:   Natalie Mcdaniel says the pain in her feet is much better, now just has "tingling".  No complaints of tremulousness or siginificant anxiety.  No nausea or vomiting.  Appetite improving but still has periods where she doesn't feel like eating.  Last BM was yesterday, loose stools triggered by Ensure.  Objective:    Filed Vitals:   03/03/15 0626 03/03/15 1650 03/03/15 2206 03/04/15 0600  BP: 133/84 99/64 117/73 131/86  Pulse: 75 88 79 94  Temp: 98.6 F (37 C) 98 F (36.7 C) 98.1 F (36.7 C) 98.5 F (36.9 C)  TempSrc: Oral Oral Oral Oral  Resp: 18 18 18 18   Height:      Weight:      SpO2: 100% 99% 100% 99%    Intake/Output Summary (Last 24 hours) at 03/04/15 1638 Last data filed at 03/03/15 1430  Gross per 24 hour  Intake    360 ml  Output      0 ml  Net    360 ml    Exam: Gen:  NAD, affect brighter Cardiovascular:  RRR, No M/R/G Respiratory:  Lungs CTAB Gastrointestinal:  Abdomen soft, NT/ND, + BS Skin: Buttock skin erythema has resolved.  Silicone dressing covering macerated area. Extremities:  No C/E/C   Data Reviewed:    Labs: Basic Metabolic Panel:  Recent Labs Lab 03/01/15 1322 03/02/15 0600  NA 133* 137  K 3.9 4.1  CL 96 95*  CO2 24 27  GLUCOSE 76 85  BUN 9 15  CREATININE 0.44* 0.59  CALCIUM 9.3 10.1   GFR Estimated Creatinine Clearance: 78.8 mL/min (by C-G formula based on  Cr of 0.59). Liver Function Tests:  Recent Labs Lab 03/01/15 1322  AST 120*  ALT 40*  ALKPHOS 103  BILITOT 0.8  PROT 7.7  ALBUMIN 3.9   CBC:  Recent Labs Lab 03/01/15 1322  WBC 5.3  NEUTROABS 3.1  HGB 12.3  HCT 35.8*  MCV 93.2  PLT 138*   Thyroid function studies:  Recent Labs  03/01/15 1610  TSH 2.252   Anemia work up:  Recent Labs  03/01/15 1610  VITAMINB12 365   Sepsis Labs:  Recent Labs Lab 03/01/15 1322  WBC 5.3    Microbiology No results found for this or any previous visit (from the past 240 hour(s)).   Medications:   . amoxicillin-clavulanate  1 tablet Oral Q12H  . antiseptic oral rinse  7 mL Mouth Rinse q12n4p  . chlorhexidine  15 mL Mouth Rinse BID  . DULoxetine  30 mg Oral Daily  . feeding supplement (ENSURE COMPLETE)  237 mL Oral BID BM  . feeding supplement (PRO-STAT SUGAR FREE 64)  30 mL Oral BID  . gabapentin  200 mg Oral TID  . heparin  5,000 Units Subcutaneous 3 times per day  . LORazepam  0-4 mg Oral Q12H  . multivitamin with minerals  1 tablet Oral Daily  . sodium chloride  3 mL Intravenous Q12H  . thiamine  100 mg Oral Daily   Or  . thiamine  100 mg Intravenous Daily  . traZODone  50 mg Oral QHS   Continuous Infusions:   Time spent: 25 minutes.   LOS: 3 days   [facility] Pager [phone]. If unable to reach me by pager, please call my cell phone at [phone].  *Please refer to [email or web address], password [email or web address] to get updated schedule on who will round on this patient, as hospitalists switch teams weekly. If 7PM-7AM, please contact night-coverage at [email or web address], password [email or web address] for any overnight needs.  03/04/2015, 8:08 AM

## 2015-03-04 NOTE — Discharge Summary (Addendum)
Physician Discharge Summary  Natalie Mcdaniel WJX:[id number] DOB: Feb 17, 1966 DOA: 03/01/2015  PCP: JEFFERY,CHELLE, PA-C  Admit date: 03/01/2015 Discharge date: 03/05/2015   Recommendations for Outpatient Follow-Up:   1. F/U with PCP in 2 weeks to ensure medically stable and reinforce abstinence. 2. F/U RBC folate level, pending at the time of discharge. 3. Needs outpatient psychiatry follow up per inpatient psychiatry recommendations.   Discharge Diagnosis:   Principal Problem:    Cellulitis Active Problems:    Alcohol dependence    Substance induced mood disorder    Neuropathy    Alcohol dependence with uncomplicated withdrawal    Hyponatremia    Transaminitis    Decubitus ulcer    Functional quadriplegia    Skin erosion   Discharge Condition: Improved.  Diet recommendation:  Regular.  Wound care: Soft silicone foam to protect and insulate the right buttock wound, barrier cream only for the left buttock. Antifungal powder for areas that are pruritic.   History of Present Illness:   Natalie Mcdaniel is an 49 y.o. 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  Hospital Course by Problem:   Principal Problem:  Cellulitis / moisture associated skin damage versus decubitus ulcer - Uncomplicated, nonpustular, patient nontoxic appearing so being treated with Augmentin 3 times a day, to complete course 03/08/15. - Wound care RN to evaluate skin erosions related to moisture associated damage from urinary incontinence. - Wound care per nursing staff per wound care nurses recommendations.  Active Problems:  Functional quadriplegia - B 12 level WNL, RBC folate pending. TSH 2.252.  - Status post PT evaluation, SNF recommended. SW consulted.   Substance induced mood disorder -  Psychiatry consultation performed. Cymbalta and Trazodone resumed.  - Outpatient treatment recommended.   Neuropathy - Likely from nutritional deficiency related to alcoholism. - Dietician consulted. - Continue neurontin, increase to 200 mg TID.   Alcohol dependence with uncomplicated withdrawal - Placed on Ativan detox CIWA protocol.  No DTs or evidence of complicated withdrawal. - Supplement thiamine and folic acid. - SW consult. S/P Psych consult. Outpatient treatment recommended.   Hyponatremia - Likely from your drinkers Potomania. Resolved with abstinence.   Transaminitis - Likely from liver inflammation secondary to alcohol abuse.    Medical Consultants:    Ambrose Finland, MD, Psychiatry   Discharge Exam:   Filed Vitals:   03/05/15 0515  BP: 100/55  Pulse: 90  Temp: 98.2 F (36.8 C)  Resp: 16   Filed Vitals:   03/04/15 0600 03/04/15 1530 03/04/15 2035 03/05/15 0515  BP: 131/86 118/67 101/51 100/55  Pulse: 94 86 95 90  Temp: 98.5 F (36.9 C) 98.6 F (37 C) 98.4 F (36.9 C) 98.2 F (36.8 C)  TempSrc: Oral Oral Oral Oral  Resp: 18 18 16 16   Height:      Weight:      SpO2: 99% 100% 100% 100%    Gen: NAD, affect brighter Cardiovascular: RRR, No M/R/G Respiratory: Lungs CTAB Gastrointestinal: Abdomen soft, NT/ND, + BS Skin: Buttock skin erythema has resolved. Silicone dressing covering macerated area. Extremities: No C/E/C   The results of significant diagnostics from this hospitalization (including imaging, microbiology, ancillary and laboratory) are listed below for reference.     Procedures and Diagnostic Studies:   None.   Labs:   Basic Metabolic Panel:  Recent Labs Lab 03/01/15  1322 03/02/15 0600  NA 133* 137  K 3.9 4.1  CL 96 95*  CO2 24 27  GLUCOSE 76 85  BUN 9 15  CREATININE 0.44* 0.59  CALCIUM 9.3 10.1   GFR Estimated Creatinine Clearance: 78.8 mL/min (by C-G formula based on Cr of 0.59). Liver  Function Tests:  Recent Labs Lab 03/01/15 1322  AST 120*  ALT 40*  ALKPHOS 103  BILITOT 0.8  PROT 7.7  ALBUMIN 3.9   CBC:  Recent Labs Lab 03/01/15 1322  WBC 5.3  NEUTROABS 3.1  HGB 12.3  HCT 35.8*  MCV 93.2  PLT 138*   Thyroid function studies No results for input(s): TSH, T4TOTAL, T3FREE, THYROIDAB in the last 72 hours.  Invalid input(s): FREET3 Anemia work up No results for input(s): VITAMINB12, FOLATE, FERRITIN, TIBC, IRON, RETICCTPCT in the last 72 hours. Microbiology No results found for this or any previous visit (from the past 240 hour(s)).   Discharge Instructions:       Discharge Instructions    Call MD for:  extreme fatigue    Complete by:  As directed      Call MD for:  severe uncontrolled pain    Complete by:  As directed      Call MD for:  temperature >100.4    Complete by:  As directed      Diet general    Complete by:  As directed      Discharge instructions    Complete by:  As directed   You were cared for by Dr. Jacquelynn Cree  (a hospitalist) during your hospital stay. If you have any questions about your discharge medications or the care you received while you were in the hospital after you are discharged, you can call the unit and ask to speak with the hospitalist on call if the hospitalist that took care of you is not available. Once you are discharged, your primary care physician will handle any further medical issues. Please note that NO REFILLS for any discharge medications will be authorized once you are discharged, as it is imperative that you return to your primary care physician (or establish a relationship with a primary care physician if you do not have one) for your aftercare needs so that they can reassess your need for medications and monitor your lab values.  Any outstanding tests can be reviewed by your PCP at your follow up visit.  It is also important to review any medicine changes with your PCP.  Please bring these d/c  instructions with you to your next visit so your physician can review these changes with you.  If you do not have a primary care physician, you can call [phone] for a physician referral.  It is highly recommended that you obtain a PCP for hospital follow up.     Discharge wound care:    Complete by:  As directed   Soft silicone foam to protect and insulate the right buttock wound, barrier cream only for the left buttock. Antifungal powder for areas that are pruritic.     Increase activity slowly    Complete by:  As directed      Walk with assistance    Complete by:  As directed      Walker     Complete by:  As directed             Medication List    TAKE these medications        amoxicillin-clavulanate 875-125 MG  per tablet  Commonly known as:  AUGMENTIN  Take 1 tablet by mouth every 12 (twelve) hours.     DULoxetine 30 MG capsule  Commonly known as:  CYMBALTA  Take 1 capsule (30 mg total) by mouth daily. For depression     feeding supplement (PRO-STAT SUGAR FREE 64) Liqd  Take 30 mLs by mouth 2 (two) times daily.     gabapentin 100 MG capsule  Commonly known as:  NEURONTIN  Take 2 capsules (200 mg total) by mouth 3 (three) times daily. For substance withdrawal syndrome     HYDROcodone-acetaminophen 5-325 MG per tablet  Commonly known as:  NORCO/VICODIN  Take 1 tablet by mouth every 6 (six) hours as needed for moderate pain.     multivitamin with minerals Tabs tablet  Take 1 tablet by mouth daily.     thiamine 100 MG tablet  Take 1 tablet (100 mg total) by mouth daily.     traZODone 50 MG tablet  Commonly known as:  DESYREL  Take 1 tablet (50 mg total) by mouth at bedtime. For sleep       Follow-up Information    Follow up with JEFFERY,CHELLE, PA-C.   Specialty:  Physician Assistant   Why:  Hospital follow up in 2 weeks.   Contact information:   [place] [address] [phone]        Time coordinating discharge: 35  minutes.  Signed:  Ayham Word  Pager [phone] [facility] 03/05/2015, 11:31 AM

## 2015-03-04 NOTE — Discharge Instructions (Signed)
Alcohol Use Disorder Alcohol use disorder is a mental disorder. It is not a one-time incident of heavy drinking. Alcohol use disorder is the excessive and uncontrollable use of alcohol over time that leads to problems with functioning in one or more areas of daily living. People with this disorder risk harming themselves and others when they drink to excess. Alcohol use disorder also can cause other mental disorders, such as mood and anxiety disorders, and serious physical problems. People with alcohol use disorder often misuse other drugs.  Alcohol use disorder is common and widespread. Some people with this disorder drink alcohol to cope with or escape from negative life events. Others drink to relieve chronic pain or symptoms of mental illness. People with a family history of alcohol use disorder are at higher risk of losing control and using alcohol to excess.  SYMPTOMS  Signs and symptoms of alcohol use disorder may include the following:   Consumption ofalcohol inlarger amounts or over a longer period of time than intended.  Multiple unsuccessful attempts to cutdown or control alcohol use.   A great deal of time spent obtaining alcohol, using alcohol, or recovering from the effects of alcohol (hangover).  A strong desire or urge to use alcohol (cravings).   Continued use of alcohol despite problems at work, school, or home because of alcohol use.   Continued use of alcohol despite problems in relationships because of alcohol use.  Continued use of alcohol in situations when it is physically hazardous, such as driving a car.  Continued use of alcohol despite awareness of a physical or psychological problem that is likely related to alcohol use. Physical problems related to alcohol use can involve the brain, heart, liver, stomach, and intestines. Psychological problems related to alcohol use include intoxication, depression, anxiety, psychosis, delirium, and dementia.   The need for  increased amounts of alcohol to achieve the same desired effect, or a decreased effect from the consumption of the same amount of alcohol (tolerance).  Withdrawal symptoms upon reducing or stopping alcohol use, or alcohol use to reduce or avoid withdrawal symptoms. Withdrawal symptoms include:  Racing heart.  Hand tremor.  Difficulty sleeping.  Nausea.  Vomiting.  Hallucinations.  Restlessness.  Seizures. DIAGNOSIS Alcohol use disorder is diagnosed through an assessment by your health care provider. Your health care provider may start by asking three or four questions to screen for excessive or problematic alcohol use. To confirm a diagnosis of alcohol use disorder, at least two symptoms must be present within a 12-month period. The severity of alcohol use disorder depends on the number of symptoms:  Mild--two or three.  Moderate--four or five.  Severe--six or more. Your health care provider may perform a physical exam or use results from lab tests to see if you have physical problems resulting from alcohol use. Your health care provider may refer you to a mental health professional for evaluation. TREATMENT  Some people with alcohol use disorder are able to reduce their alcohol use to low-risk levels. Some people with alcohol use disorder need to quit drinking alcohol. When necessary, mental health professionals with specialized training in substance use treatment can help. Your health care provider can help you decide how severe your alcohol use disorder is and what type of treatment you need. The following forms of treatment are available:   Detoxification. Detoxification involves the use of prescription medicines to prevent alcohol withdrawal symptoms in the first week after quitting. This is important for people with a history of symptoms   of withdrawal and for heavy drinkers who are likely to have withdrawal symptoms. Alcohol withdrawal can be dangerous and, in severe cases, cause  death. Detoxification is usually provided in a hospital or in-patient substance use treatment facility.  Counseling or talk therapy. Talk therapy is provided by substance use treatment counselors. It addresses the reasons people use alcohol and ways to keep them from drinking again. The goals of talk therapy are to help people with alcohol use disorder find healthy activities and ways to cope with life stress, to identify and avoid triggers for alcohol use, and to handle cravings, which can cause relapse.  Medicines.Different medicines can help treat alcohol use disorder through the following actions:  Decrease alcohol cravings.  Decrease the positive reward response felt from alcohol use.  Produce an uncomfortable physical reaction when alcohol is used (aversion therapy).  Support groups. Support groups are run by people who have quit drinking. They provide emotional support, advice, and guidance. These forms of treatment are often combined. Some people with alcohol use disorder benefit from intensive combination treatment provided by specialized substance use treatment centers. Both inpatient and outpatient treatment programs are available. Document Released: 01/15/2005 Document Revised: 04/24/2014 Document Reviewed: 03/17/2013 [employer] Patient Information 2015 [employer]. This information is not intended to replace advice given to you by your health care provider. Make sure you discuss any questions you have with your health care provider.  

## 2015-03-05 DIAGNOSIS — F1994 Other psychoactive substance use, unspecified with psychoactive substance-induced mood disorder: Secondary | ICD-10-CM | POA: Diagnosis not present

## 2015-03-05 DIAGNOSIS — F1099 Alcohol use, unspecified with unspecified alcohol-induced disorder: Secondary | ICD-10-CM | POA: Diagnosis not present

## 2015-03-05 LAB — FOLATE RBC
FOLATE, RBC: 769 ng/mL (ref >498)
Folate, Hemolysate: 257 ng/mL
Hematocrit: 33.4 % — ABNORMAL LOW (ref 34.0–46.6)
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
## 2015-03-05 NOTE — Progress Notes (Signed)
OT Cancellation Note  Patient Details Name: Natalie Mcdaniel MRN: [id number] DOB: [birth year]   Cancelled Treatment:    Reason Eval/Treat Not Completed: Other (comment) Note plan for d/c to SNF today. Will defer OT eval to SNF.  [place], [place] 03/05/2015, 11:57 AM

## 2015-03-05 NOTE — Progress Notes (Signed)
Patient d/c'd yesterday but did not have insurance authorization.  OK to d/c today.  No change in previously dictated d/c summary.  Natalie Mcdaniel 03/05/2015 11:32 AM

## 2015-03-05 NOTE — Progress Notes (Signed)
Clinical Social Work  CSW rounded with psych MD to assess patient. Patient alert and engaged in assessment. Patient reports she remains weak and deconditioned and wants SNF placement. PT worked with patient and only recommending [facility]. CSW spoke with SNF who reports they could still obtain insurance auth due to wound care needs and feels they could assist with patient becoming more steady. Patient reports she is happy to go to SNF because she knows she cannot drink alcohol there. Patient plans to work on physical health and then is considering admitting to inpatient substance abuse treatment facility. CSW provided resources and explained that patient could follow up after DC from SNF.  CSW spoke with [facility] who is agreeable to accept patient today and has all information. DC packet prepared with FL2, DC summary, hard scripts and PTAR forms included. Patient prefers PTAR and is aware of no guarantee of payment. Patient asked that CSW update her husband so CSW spoke with husband via phone and gave information for [facility]. RN to call report to SNF.  PTAR arranged for 12:30pm pick up. PTAR #:[id number].  CSW is signing of but available if needed.  [place],  [phone]

## 2015-03-05 NOTE — Progress Notes (Signed)
CARE MANAGEMENT NOTE 03/05/2015  Patient:  Natalie Mcdaniel, Natalie Mcdaniel   Account Number:  [id number]  Date Initiated:  03/05/2015  Documentation initiated by:  Edwyna Shell  Subjective/Objective Assessment:   49 yo admitted with cellulitis from home with spouse     Action/Plan:   discharge planning   Anticipated DC Date:  03/05/2015   Anticipated DC Plan:  [place]  In-house referral  Clinical Social Worker      DC Planning Services  CM consult      Choice offered to / List presented to:             Status of service:  Completed, signed off Medicare Important Message given?   (If response is "NO", the following Medicare IM given date fields will be blank) Date Medicare IM given:   Medicare IM given by:   Date Additional Medicare IM given:   Additional Medicare IM given by:    Discharge Disposition:    Per UR Regulation:    If discussed at Long Length of Stay Meetings, dates discussed:    Comments:  03/05/15 Edwyna Shell RN [facility] CM [phone] Received referral for medications needs. Patiernt stated that she has insurance and has people to pick up her medications for her if she needed anything. She stated that she has not been on regulary scheduled medications. Offered to provided patient with a list of pharmacies that deliver but patient declined. She stated that she thought she was going to a SNF for rehab upon discharge. This CM notified CSW to follow up with patient prior to dc. Will follow if any discharge needs.

## 2015-03-05 NOTE — Progress Notes (Signed)
Physical Therapy Treatment Patient Details Name: Natalie Mcdaniel MRN: [id number] DOB: May 25, 1966 Today's Date: 03/05/2015    History of Present Illness Natalie Mcdaniel is an 49 y.o. female with a PMH alcoholism (admits to 24 beers/day intake) and Roux-en-Y gastric bypass who presents with skin irritation of bilateral buttocks from sitting in her own urine due to immobility and pain from bilateral lower extremity peripheral neuropathy.    PT Comments    Pt has progressed well with mobility. She ambulated 400' with RW without loss of balance. She stated she very much wants to stop drinking and is worried that if she goes home she'll start drinking again. She expressed strong desire to go to inpt alcohol rehab. She stated, "I want to stop, but I need help". She would benefit from HHPT vs. outpt PT for LE strengthening and balance training.    Follow Up Recommendations  Home health PT (pt very interested in inpt  EtOH rehab)     Equipment Recommendations  Other (comment) (transfer tub bench)    Recommendations for Other Services OT consult     Precautions / Restrictions Precautions Precautions: Fall Precaution Comments: 8 falls in past year, most recently fell on stairs just prior to admission Restrictions Weight Bearing Restrictions: No    Mobility  Bed Mobility               General bed mobility comments: pt sitting on EOB at start of eval  Transfers   Equipment used: Rolling walker (2 wheeled) Transfers: Sit to/from Stand Sit to Stand: From elevated surface;Supervision         General transfer comment: supervision due to h/o falls, no LOB  Ambulation/Gait Ambulation/Gait assistance: Modified independent (Device/Increase time) Ambulation Distance (Feet): 400 Feet Assistive device: Rolling walker (2 wheeled) Gait Pattern/deviations: Wide base of support;Step-through pattern     General Gait Details: steady with RW, no LOB   Stairs             Wheelchair Mobility    Modified Rankin (Stroke Patients Only)       Balance             Standing balance-Leahy Scale: Fair                      Cognition Arousal/Alertness: Awake/alert Behavior During Therapy: WFL for tasks assessed/performed Overall Cognitive Status: Within Functional Limits for tasks assessed                      Exercises      General Comments        Pertinent Vitals/Pain Pain Assessment: 0-10 Pain Score: 9  Pain Location: B feet Pain Descriptors / Indicators: Tingling Pain Intervention(s): Monitored during session;Premedicated before session    Home Living                      Prior Function            PT Goals (current goals can now be found in the care plan section) Acute Rehab PT Goals Patient Stated Goal: to walk better, to get legs stronger PT Goal Formulation: With patient Time For Goal Achievement: 03/16/15 Potential to Achieve Goals: Good    Frequency  Min 3X/week    PT Plan Discharge plan needs to be updated    Co-evaluation             End of Session Equipment Utilized During Treatment: Gait belt Activity  Tolerance: Patient tolerated treatment well Patient left: with call bell/phone within reach;in bed     Time: 1045-1103 PT Time Calculation (min) (ACUTE ONLY): 18 min  Charges:  $Gait Training: 8-22 mins                    G Codes:      Natalie Mcdaniel 03/05/2015, 11:12 AM [phone]

## 2015-03-20 ENCOUNTER — Emergency Department (HOSPITAL_COMMUNITY)
Admission: EM | Admit: 2015-03-20 | Discharge: 2015-03-20 | Disposition: A | Discharge status: Home / Self Care | Payer: Managed Care, Other (non HMO)

## 2015-03-20 ENCOUNTER — Encounter (HOSPITAL_COMMUNITY): Payer: Self-pay | Admitting: Emergency Medicine

## 2015-03-20 NOTE — ED Notes (Signed)
Pt called to do vital signs. No answer in lobby or outside lobby.

## 2015-03-20 NOTE — ED Notes (Signed)
Pt was getting out of bed today, walks with walker, and fell, hitting her R side of her head on a step stool. No bleeding or deformity. No blood thinners. Alert and oriented. ETOH on board.

## 2015-03-20 NOTE — ED Notes (Signed)
Pt called multiple times for vital signs in triage. No answer/sign of pt in lobby or outside lobby.

## 2015-03-21 ENCOUNTER — Emergency Department (HOSPITAL_COMMUNITY)
Admission: EM | Admit: 2015-03-21 | Discharge: 2015-03-21 | Disposition: A | Discharge status: Home / Self Care | Payer: Managed Care, Other (non HMO) | Attending: Emergency Medicine | Admitting: Emergency Medicine

## 2015-03-21 DIAGNOSIS — Y9289 Other specified places as the place of occurrence of the external cause: Secondary | ICD-10-CM | POA: Insufficient documentation

## 2015-03-21 DIAGNOSIS — F10129 Alcohol abuse with intoxication, unspecified: Secondary | ICD-10-CM | POA: Insufficient documentation

## 2015-03-21 DIAGNOSIS — Y998 Other external cause status: Secondary | ICD-10-CM | POA: Insufficient documentation

## 2015-03-21 DIAGNOSIS — X58XXXA Exposure to other specified factors, initial encounter: Secondary | ICD-10-CM | POA: Insufficient documentation

## 2015-03-21 DIAGNOSIS — Z9884 Bariatric surgery status: Secondary | ICD-10-CM | POA: Insufficient documentation

## 2015-03-21 DIAGNOSIS — F1010 Alcohol abuse, uncomplicated: Secondary | ICD-10-CM

## 2015-03-21 DIAGNOSIS — F419 Anxiety disorder, unspecified: Secondary | ICD-10-CM | POA: Insufficient documentation

## 2015-03-21 DIAGNOSIS — Z87828 Personal history of other (healed) physical injury and trauma: Secondary | ICD-10-CM | POA: Insufficient documentation

## 2015-03-21 DIAGNOSIS — S61401A Unspecified open wound of right hand, initial encounter: Secondary | ICD-10-CM | POA: Insufficient documentation

## 2015-03-21 DIAGNOSIS — Y9389 Activity, other specified: Secondary | ICD-10-CM | POA: Insufficient documentation

## 2015-03-21 DIAGNOSIS — E669 Obesity, unspecified: Secondary | ICD-10-CM | POA: Insufficient documentation

## 2015-03-21 DIAGNOSIS — Z79899 Other long term (current) drug therapy: Secondary | ICD-10-CM | POA: Insufficient documentation

## 2015-03-21 DIAGNOSIS — F329 Major depressive disorder, single episode, unspecified: Secondary | ICD-10-CM | POA: Insufficient documentation

## 2015-03-21 DIAGNOSIS — Z862 Personal history of diseases of the blood and blood-forming organs and certain disorders involving the immune mechanism: Secondary | ICD-10-CM | POA: Insufficient documentation

## 2015-03-21 MED ORDER — ADULT MULTIVITAMIN W/MINERALS CH
1.0000 | ORAL_TABLET | Freq: Once | ORAL | Status: AC
  Administered 2015-03-21: 1 via ORAL
  Filled 2015-03-21: qty 1

## 2015-03-21 MED ORDER — VITAMIN B-1 100 MG PO TABS
100.0000 mg | ORAL_TABLET | Freq: Once | ORAL | Status: AC
  Administered 2015-03-21: 100 mg via ORAL
  Filled 2015-03-21: qty 1

## 2015-03-21 MED ORDER — FOLIC ACID 1 MG PO TABS
1.0000 mg | ORAL_TABLET | Freq: Once | ORAL | Status: AC
  Administered 2015-03-21: 1 mg via ORAL
  Filled 2015-03-21: qty 1

## 2015-03-21 NOTE — Discharge Instructions (Signed)
Alcohol Intoxication °Alcohol intoxication occurs when you drink enough alcohol that it affects your ability to function. It can be mild or very severe. Drinking a lot of alcohol in a short time is called binge drinking. This can be very harmful. Drinking alcohol can also be more dangerous if you are taking medicines or other drugs. Some of the effects caused by alcohol may include: °· Loss of coordination. °· Changes in mood and behavior. °· Unclear thinking. °· Trouble talking (slurred speech). °· Throwing up (vomiting). °· Confusion. °· Slowed breathing. °· Twitching and shaking (seizures). °· Loss of consciousness. °HOME CARE °· Do not drive after drinking alcohol. °· Drink enough water and fluids to keep your pee (urine) clear or pale yellow. Avoid caffeine. °· Only take medicine as told by your doctor. °GET HELP IF: °· You throw up (vomit) many times. °· You do not feel better after a few days. °· You frequently have alcohol intoxication. Your doctor can help decide if you should see a substance use treatment counselor. °GET HELP RIGHT AWAY IF: °· You become shaky when you stop drinking. °· You have twitching and shaking. °· You throw up blood. It may look bright red or like coffee grounds. °· You notice blood in your poop (bowel movements). °· You become lightheaded or pass out (faint). °MAKE SURE YOU:  °· Understand these instructions. °· Will watch your condition. °· Will get help right away if you are not doing well or get worse. °Document Released: 05/26/2008 Document Revised: 08/10/2013 Document Reviewed: 05/13/2013 °ExitCare® Patient Information ©2015 [employer]. This information is not intended to replace advice given to you by your health care provider. Make sure you discuss any questions you have with your health care provider. ° °

## 2015-03-21 NOTE — ED Notes (Signed)
Pt given crackers and water  Pt normally uses a walker at home  Took pt to bathroom in wheelchair  Was able to ambulate holding on to rail in restroom

## 2015-03-21 NOTE — ED Notes (Signed)
Pt brought in by EMS requesting ETOH detox. Pt was seen previously on 3/29 for a fall and ETOH intoxication. EMS states that the pt called herself, stating that she fell and hit her head, but the pt was able to walk to the door to open the door for EMS. Pt states she passed out and her cats were licking on her which is why she was awakened. Pt was in C-collar placed by EMS but the pt started to yell and scream that she wanted it off. C-collar taken off and pt ambulated to the bathroom. Per EMS multiple bottles of beer in the pt's room. Skin tear noted between right thumb and first finger.

## 2015-03-21 NOTE — ED Notes (Signed)
Pt calling people trying to find a way home

## 2015-03-21 NOTE — ED Notes (Signed)
Bed: KD59 Expected date: 03/21/15 Expected time: 1:35 AM Means of arrival: Ambulance Comments: ETOH, fall
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
## 2015-03-21 NOTE — ED Notes (Signed)
Pt states she did not call call EMS, that her neighbor called because she fell and hurt herself. Pt states she chipped her tooth. Pt states she is not able to walk without a walker. Pt is complaining of pain in her mouth.

## 2015-03-22 ENCOUNTER — Emergency Department (HOSPITAL_COMMUNITY)
Admission: EM | Admit: 2015-03-22 | Discharge: 2015-03-22 | Discharge status: Against Medical Advice | Payer: Managed Care, Other (non HMO) | Attending: Emergency Medicine | Admitting: Emergency Medicine

## 2015-03-22 ENCOUNTER — Encounter (HOSPITAL_COMMUNITY): Payer: Self-pay | Admitting: *Deleted

## 2015-03-22 DIAGNOSIS — F10129 Alcohol abuse with intoxication, unspecified: Secondary | ICD-10-CM | POA: Insufficient documentation

## 2015-03-22 DIAGNOSIS — E669 Obesity, unspecified: Secondary | ICD-10-CM | POA: Insufficient documentation

## 2015-03-22 NOTE — ED Notes (Signed)
Bed: [facility] Expected date:  Expected time:  Means of arrival:  Comments: EMS 49 yo female intoxicated

## 2015-03-22 NOTE — ED Notes (Signed)
Pt states she is intoxicated, has drank 48 beers today, pt walking to restroom w/ walker which is what she uses at home to walk with, pt in no distress.

## 2015-03-22 NOTE — ED Notes (Signed)
Per EMS pt from home, pt has drank 2 24 packs of beer today, has been here past 2 days for same, pt called and stated she drank too much, when EMS arrived she was walking around.

## 2015-03-23 ENCOUNTER — Emergency Department (HOSPITAL_COMMUNITY)
Admission: EM | Admit: 2015-03-23 | Discharge: 2015-03-23 | Discharge status: Against Medical Advice | Payer: Managed Care, Other (non HMO) | Attending: Emergency Medicine | Admitting: Emergency Medicine

## 2015-03-23 ENCOUNTER — Encounter (HOSPITAL_COMMUNITY): Payer: Self-pay | Admitting: Emergency Medicine

## 2015-03-23 DIAGNOSIS — R1110 Vomiting, unspecified: Secondary | ICD-10-CM | POA: Insufficient documentation

## 2015-03-23 DIAGNOSIS — E669 Obesity, unspecified: Secondary | ICD-10-CM | POA: Insufficient documentation

## 2015-03-23 DIAGNOSIS — Z862 Personal history of diseases of the blood and blood-forming organs and certain disorders involving the immune mechanism: Secondary | ICD-10-CM | POA: Insufficient documentation

## 2015-03-23 DIAGNOSIS — R197 Diarrhea, unspecified: Secondary | ICD-10-CM

## 2015-03-23 DIAGNOSIS — Z9884 Bariatric surgery status: Secondary | ICD-10-CM | POA: Insufficient documentation

## 2015-03-23 DIAGNOSIS — Z79899 Other long term (current) drug therapy: Secondary | ICD-10-CM | POA: Insufficient documentation

## 2015-03-23 DIAGNOSIS — F419 Anxiety disorder, unspecified: Secondary | ICD-10-CM | POA: Insufficient documentation

## 2015-03-23 DIAGNOSIS — F329 Major depressive disorder, single episode, unspecified: Secondary | ICD-10-CM | POA: Insufficient documentation

## 2015-03-23 DIAGNOSIS — F1010 Alcohol abuse, uncomplicated: Secondary | ICD-10-CM | POA: Insufficient documentation

## 2015-03-23 DIAGNOSIS — Z87828 Personal history of other (healed) physical injury and trauma: Secondary | ICD-10-CM | POA: Insufficient documentation

## 2015-03-23 LAB — BASIC METABOLIC PANEL
Anion gap: 16 — ABNORMAL HIGH (ref 5–15)
BUN: <5 mg/dL — ABNORMAL LOW (ref 6–23)
CO2: 27 mmol/L (ref 19–32)
Calcium: 9.1 mg/dL (ref 8.4–10.5)
Chloride: 92 mmol/L — ABNORMAL LOW (ref 96–112)
Creatinine, Ser: 0.52 mg/dL (ref 0.50–1.10)
GFR calc Af Amer: >90 mL/min (ref >90)
GFR calc non Af Amer: >90 mL/min (ref >90)
Glucose, Bld: 91 mg/dL (ref 70–99)
Potassium: 3.5 mmol/L (ref 3.5–5.1)
Sodium: 135 mmol/L (ref 135–145)

## 2015-03-23 LAB — CBC WITH DIFFERENTIAL/PLATELET
Basophils Absolute: 0 10*3/uL (ref 0.0–0.1)
Basophils Relative: 0 % (ref 0–1)
Eosinophils Absolute: 0.1 10*3/uL (ref 0.0–0.7)
Eosinophils Relative: 2 % (ref 0–5)
HCT: 37.4 % (ref 36.0–46.0)
Hemoglobin: 13.1 g/dL (ref 12.0–15.0)
Lymphocytes Relative: 31 % (ref 12–46)
Lymphs Abs: 1.5 10*3/uL (ref 0.7–4.0)
MCH: 32.3 pg (ref 26.0–34.0)
MCHC: 35 g/dL (ref 30.0–36.0)
MCV: 92.3 fL (ref 78.0–100.0)
Monocytes Absolute: 0.3 10*3/uL (ref 0.1–1.0)
Monocytes Relative: 5 % (ref 3–12)
Neutro Abs: 2.9 10*3/uL (ref 1.7–7.7)
Neutrophils Relative %: 62 % (ref 43–77)
Platelets: 132 10*3/uL — ABNORMAL LOW (ref 150–400)
RBC: 4.05 MIL/uL (ref 3.87–5.11)
RDW: 15 % (ref 11.5–15.5)
WBC: 4.8 10*3/uL (ref 4.0–10.5)

## 2015-03-23 MED ORDER — SODIUM CHLORIDE 0.9 % IV BOLUS (SEPSIS)
1000.0000 mL | Freq: Once | INTRAVENOUS | Status: AC
  Administered 2015-03-23: 1000 mL via INTRAVENOUS

## 2015-03-23 NOTE — Discharge Instructions (Signed)
Alcohol and Nutrition Nutrition serves two purposes. It provides energy. It also maintains body structure and function. Food supplies energy. It also provides the building blocks needed to replace worn or damaged cells. Alcoholics often eat poorly. This limits their supply of essential nutrients. This affects energy supply and structure maintenance. Alcohol also affects the body's nutrients in:  Digestion.  Storage.  Using and getting rid of waste products. IMPAIRMENT OF NUTRIENT DIGESTION AND UTILIZATION   Once ingested, food must be broken down into small components (digested). Then it is available for energy. It helps maintain body structure and function. Digestion begins in the mouth. It continues in the stomach and intestines, with help from the pancreas. The nutrients from digested food are absorbed from the intestines into the blood. Then they are carried to the liver. The liver prepares nutrients for:  Immediate use.  Storage and future use.  Alcohol inhibits the breakdown of nutrients into usable molecules.  It decreases secretion of digestive enzymes from the pancreas.  Alcohol impairs nutrient absorption by damaging the cells lining the stomach and intestines.  It also interferes with moving some nutrients into the blood.  In addition, nutritional deficiencies themselves may lead to further absorption problems.  For example, folate deficiency changes the cells that line the small intestine. This impairs how water is absorbed. It also affects absorbed nutrients. These include glucose, sodium, and additional folate.  Even if nutrients are digested and absorbed, alcohol can prevent them from being fully used. It changes their transport, storage, and excretion. Impaired utilization of nutrients by alcoholics is indicated by:  Decreased liver stores of vitamins, such as vitamin A.  Increased excretion of nutrients such as fat. ALCOHOL AND ENERGY SUPPLY   Three basic  nutritional components found in food are:  Carbohydrates.  Proteins.  Fats.  These are used as energy. Some alcoholics take in as much as 50% of their total daily calories from alcohol. They often neglect important foods.  Even when enough food is eaten, alcohol can impair the ways the body controls blood sugar (glucose) levels. It may either increase or decrease blood sugar.  In non-diabetic alcoholics, increased blood sugar (hyperglycemia) is caused by poor insulin secretion. It is usually temporary.  Decreased blood sugar (hypoglycemia) can cause serious injury even if this condition is short-lived. Low blood sugar can happen when a fasting or malnourished person drinks alcohol. When there is no food to supply energy, stored sugar is used up. The products of alcohol inhibit forming glucose from other compounds such as amino acids. As a result, alcohol causes the brain and other body tissue to lack glucose. It is needed for energy and function.  Alcohol is an energy source. But how the body processes and uses the energy from alcohol is complex. Also, when alcohol is substituted for carbohydrates, subjects tend to lose weight. This indicates that they get less energy from alcohol than from food. ALCOHOL - MAINTAINING CELL STRUCTURE AND FUNCTION  Structure Cells are made mostly of protein. So an adequate protein diet is important for maintaining cell structure. This is especially true if cells are being damaged. Research indicates that alcohol affects protein nutrition by causing impaired:  Digestion of proteins to amino acids.  Processing of amino acids by the small intestine and liver.  Synthesis of proteins from amino acids.  Protein secretion by the liver. Function Nutrients are essential for the body to function well. They provide the tools that the body needs to work well:  Proteins.  Vitamins.  Minerals. Alcohol can disrupt body function. It may cause nutrient  deficiencies. And it may interfere with the way nutrients are processed. Vitamins  Vitamins are essential to maintain growth and normal metabolism. They regulate many of the body`s processes. Chronic heavy drinking causes deficiencies in many vitamins. This is caused by eating less. And, in some cases, vitamins may be poorly absorbed. For example, alcohol inhibits fat absorption. It impairs how the vitamins A, E, and D are normally absorbed along with dietary fats. Not enough vitamin A may cause night blindness. Not enough vitamin D may cause softening of the bones.  Some alcoholics lack vitamins A, C, D, E, K, and the B vitamins. These are all involved in wound healing and cell maintenance. In particular, because vitamin K is necessary for blood clotting, lacking that vitamin can cause delayed clotting. The result is excess bleeding. Lacking other vitamins involved in brain function may cause severe neurological damage. Minerals Deficiencies of minerals such as calcium, magnesium, iron, and zinc are common in alcoholics. The alcohol itself does not seem to affect how these minerals are absorbed. Rather, they seem to occur secondary to other alcohol-related problems, such as:  Less calcium absorbed.  Not enough magnesium.  More urinary excretion.  Vomiting.  Diarrhea.  Not enough iron due to gastrointestinal bleeding.  Not enough zinc or losses related to other nutrient deficiencies.  Mineral deficiencies can cause a variety of medical consequences. These range from calcium-related bone disease to zinc-related night blindness and skin lesions. ALCOHOL, MALNUTRITION, AND MEDICAL COMPLICATIONS  Liver Disease   Alcoholic liver damage is caused primarily by alcohol itself. But poor nutrition may increase the risk of alcohol-related liver damage. For example, nutrients normally found in the liver are known to be affected by drinking alcohol. These include carotenoids, which are the major  sources of vitamin A, and vitamin E compounds. Decreases in such nutrients may play some role in alcohol-related liver damage. Pancreatitis  Research suggests that malnutrition may increase the risk of developing alcoholic pancreatitis. Research suggests that a diet lacking in protein may increase alcohol's damaging effect on the pancreas. Brain  Nutritional deficiencies may have severe effects on brain function. These may be permanent. Specifically, thiamine deficiencies are often seen in alcoholics. They can cause severe neurological problems. These include:  Impaired movement.  Memory loss seen in Wernicke-Korsakoff syndrome. Pregnancy  Alcohol has toxic effects on fetal development. It causes alcohol-related birth defects. They include fetal alcohol syndrome. Alcohol itself is toxic to the fetus. Also, the nutritional deficiency can affect how the fetus develops. That may compound the risk of developmental damage.  Nutritional needs during pregnancy are 10% to 30% greater than normal. Food intake can increase by as much as 140% to cover the needs of both mother and fetus. An alcoholic mother`s nutritional problems may adversely affect the nutrition of the fetus. And alcohol itself can also restrict nutrition flow to the fetus. NUTRITIONAL STATUS OF ALCOHOLICS  Techniques for assessing nutritional status include:  Taking body measurements to estimate fat reserves. They include:  Weight.  Height.  Mass.  Skin fold thickness.  Performing blood analysis to provide measurements of circulating:  Proteins.  Vitamins.  Minerals.  These techniques tend to be imprecise. For many nutrients, there is no clear "cut-off" point that would allow an accurate definition of deficiency. So assessing the nutritional status of alcoholics is limited by these techniques. Dietary status may provide information about the risk of developing nutritional problems.  Dietary status is assessed by:  Taking  patients' dietary histories.  Evaluating the amount and types of food they are eating.  It is difficult to determine what exact amount of alcohol begins to have damaging effects on nutrition. In general, moderate drinkers have 2 drinks or less per day. They seem to be at little risk for nutritional problems. Various medical disorders begin to appear at greater levels.  Research indicates that the majority of even the heaviest drinkers have few obvious nutritional deficiencies. Many alcoholics who are hospitalized for medical complications of their disease do have severe malnutrition. Alcoholics tend to eat poorly. Often they eat less than the amounts of food necessary to provide enough:  Carbohydrates.  Protein.  Fat.  Vitamins A and C.  B vitamins.  Minerals like calcium and iron. Of major concern is alcohol's effect on digesting food and use of nutrients. It may shift a mildly malnourished person toward severe malnutrition. Document Released: 10/02/2005 Document Revised: 03/01/2012 Document Reviewed: 03/18/2006 [facility] Patient Information 2015 [place], [place]. This information is not intended to replace advice given to you by your health care provider. Make sure you discuss any questions you have with your health care provider.

## 2015-03-23 NOTE — ED Notes (Signed)
Upon arrival pt has feces down legs and request needs to use the restroom. Pt assisted via wheelchair by [facility] NT for restroom assistance.

## 2015-03-23 NOTE — ED Notes (Signed)
Pt reports worsening n/v/d for four days. Pt seen yesterday for same.

## 2015-03-23 NOTE — ED Notes (Addendum)
Pt pulled out IV refusing fluids or additional care. Explained to pt awaiting lab results. Pt uses walker and walks self out refusing vital signs or additional care. [place] notified.

## 2015-03-23 NOTE — ED Notes (Signed)
Bed: WA02 Expected date:  Expected time:  Means of arrival:  Comments: EMS-diarrhea 

## 2015-03-24 ENCOUNTER — Emergency Department (HOSPITAL_COMMUNITY)
Admission: EM | Admit: 2015-03-24 | Discharge: 2015-03-25 | Disposition: A | Discharge status: Home / Self Care | Payer: Managed Care, Other (non HMO) | Attending: Emergency Medicine | Admitting: Emergency Medicine

## 2015-03-24 ENCOUNTER — Encounter (HOSPITAL_COMMUNITY): Payer: Self-pay | Admitting: *Deleted

## 2015-03-24 DIAGNOSIS — F10920 Alcohol use, unspecified with intoxication, uncomplicated: Secondary | ICD-10-CM

## 2015-03-24 DIAGNOSIS — Z862 Personal history of diseases of the blood and blood-forming organs and certain disorders involving the immune mechanism: Secondary | ICD-10-CM | POA: Insufficient documentation

## 2015-03-24 DIAGNOSIS — Z79899 Other long term (current) drug therapy: Secondary | ICD-10-CM | POA: Insufficient documentation

## 2015-03-24 DIAGNOSIS — G629 Polyneuropathy, unspecified: Secondary | ICD-10-CM | POA: Insufficient documentation

## 2015-03-24 DIAGNOSIS — F10120 Alcohol abuse with intoxication, uncomplicated: Secondary | ICD-10-CM | POA: Insufficient documentation

## 2015-03-24 DIAGNOSIS — Z9884 Bariatric surgery status: Secondary | ICD-10-CM | POA: Insufficient documentation

## 2015-03-24 DIAGNOSIS — F419 Anxiety disorder, unspecified: Secondary | ICD-10-CM | POA: Insufficient documentation

## 2015-03-24 DIAGNOSIS — F329 Major depressive disorder, single episode, unspecified: Secondary | ICD-10-CM | POA: Insufficient documentation

## 2015-03-24 DIAGNOSIS — E669 Obesity, unspecified: Secondary | ICD-10-CM | POA: Insufficient documentation

## 2015-03-24 LAB — COMPREHENSIVE METABOLIC PANEL
ALT: 33 U/L (ref 0–35)
ANION GAP: 11 (ref 5–15)
AST: 67 U/L — AB (ref 0–37)
Albumin: 4.5 g/dL (ref 3.5–5.2)
Alkaline Phosphatase: 85 U/L (ref 39–117)
BUN: <5 mg/dL — ABNORMAL LOW (ref 6–23)
CALCIUM: 8.7 mg/dL (ref 8.4–10.5)
CHLORIDE: 93 mmol/L — AB (ref 96–112)
CO2: 25 mmol/L (ref 19–32)
CREATININE: 0.36 mg/dL — AB (ref 0.50–1.10)
GFR calc Af Amer: >90 mL/min (ref >90)
GFR calc non Af Amer: >90 mL/min (ref >90)
Glucose, Bld: 96 mg/dL (ref 70–99)
POTASSIUM: 3.8 mmol/L (ref 3.5–5.1)
Sodium: 129 mmol/L — ABNORMAL LOW (ref 135–145)
Total Bilirubin: 0.6 mg/dL (ref 0.3–1.2)
Total Protein: 7.9 g/dL (ref 6.0–8.3)

## 2015-03-24 LAB — RAPID URINE DRUG SCREEN, HOSP PERFORMED
Amphetamines: NOT DETECTED
BARBITURATES: NOT DETECTED
Benzodiazepines: NOT DETECTED
COCAINE: NOT DETECTED
Opiates: NOT DETECTED
Tetrahydrocannabinol: NOT DETECTED

## 2015-03-24 LAB — CBC
HCT: 36 % (ref 36.0–46.0)
Hemoglobin: 12.5 g/dL (ref 12.0–15.0)
MCH: 32 pg (ref 26.0–34.0)
MCHC: 34.7 g/dL (ref 30.0–36.0)
MCV: 92.1 fL (ref 78.0–100.0)
PLATELETS: 99 10*3/uL — AB (ref 150–400)
RBC: 3.91 MIL/uL (ref 3.87–5.11)
RDW: 15 % (ref 11.5–15.5)
WBC: 3.8 10*3/uL — ABNORMAL LOW (ref 4.0–10.5)

## 2015-03-24 LAB — SALICYLATE LEVEL: Salicylate Lvl: <4 mg/dL (ref 2.8–20.0)

## 2015-03-24 LAB — ETHANOL: ALCOHOL ETHYL (B): 442 mg/dL — AB (ref 0–9)

## 2015-03-24 LAB — ACETAMINOPHEN LEVEL: Acetaminophen (Tylenol), Serum: <10 ug/mL — ABNORMAL LOW (ref 10–30)

## 2015-03-24 NOTE — ED Notes (Signed)
Bed: BP79 Expected date:  Expected time:  Means of arrival:  Comments: Rm 12

## 2015-03-24 NOTE — ED Notes (Signed)
Patient ambulated out into hallway without scrub top on Patient redirected back into room New scrub top given to patient
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
## 2015-03-24 NOTE — ED Notes (Signed)
Bed: RESB Expected date:  Expected time:  Means of arrival:  Comments: EMS 48 SI, combative

## 2015-03-25 MED ORDER — CARBAMAZEPINE 200 MG PO TABS: ORAL_TABLET | ORAL | Status: DC

## 2015-03-25 NOTE — ED Provider Notes (Signed)
Received signout at the beginning of shift. Patient with a significant history of alcohol abuse who was brought here yesterday for evaluation of alcohol intoxication. Initially she was noted that she was expressing SI however patient denies and does not have any active suicidal ideation during this ER visit. She has a fairly unremarkable night, sleeping. At the time of reevaluation, patient was sleeping soundly, will monitor and will discharge when appropriate.  8:34 AM Patient is sober. Actively denies SI or HI. She is amenable for outpatient alcohol detox. Resources provided.  Will prescribe tegretol to help with alcohol detox  BP 131/77 mmHg  Pulse 99  Temp(Src) 98.6 F (37 C) (Oral)  Resp 16  Ht 5\' 6"  (1.676 m)  Wt 160 lb (72.576 kg)  BMI 25.84 kg/m2  SpO2 94%  LMP 08/31/2013  I have reviewed nursing notes and vital signs. I reviewed available ER/hospitalization records thought the EMR  Results for orders placed or performed during the hospital encounter of 03/24/15  Acetaminophen level  Result Value Ref Range   Acetaminophen (Tylenol), Serum <10.0 (L) 10 - 30 ug/mL  CBC  Result Value Ref Range   WBC 3.8 (L) 4.0 - 10.5 K/uL   RBC 3.91 3.87 - 5.11 MIL/uL   Hemoglobin 12.5 12.0 - 15.0 g/dL   HCT 36.0 36.0 - 46.0 %   MCV 92.1 78.0 - 100.0 fL   MCH 32.0 26.0 - 34.0 pg   MCHC 34.7 30.0 - 36.0 g/dL   RDW 15.0 11.5 - 15.5 %   Platelets 99 (L) 150 - 400 K/uL  Comprehensive metabolic panel  Result Value Ref Range   Sodium 129 (L) 135 - 145 mmol/L   Potassium 3.8 3.5 - 5.1 mmol/L   Chloride 93 (L) 96 - 112 mmol/L   CO2 25 19 - 32 mmol/L   Glucose, Bld 96 70 - 99 mg/dL   BUN <5 (L) 6 - 23 mg/dL   Creatinine, Ser 0.36 (L) 0.50 - 1.10 mg/dL   Calcium 8.7 8.4 - 10.5 mg/dL   Total Protein 7.9 6.0 - 8.3 g/dL   Albumin 4.5 3.5 - 5.2 g/dL   AST 67 (H) 0 - 37 U/L   ALT 33 0 - 35 U/L   Alkaline Phosphatase 85 39 - 117 U/L   Total Bilirubin 0.6 0.3 - 1.2 mg/dL   GFR calc non Af Amer  >90 >90 mL/min   GFR calc Af Amer >90 >90 mL/min   Anion gap 11 5 - 15  Ethanol (ETOH)  Result Value Ref Range   Alcohol, Ethyl (B) 442 (HH) 0 - 9 mg/dL  Salicylate level  Result Value Ref Range   Salicylate Lvl <5.1 2.8 - 20.0 mg/dL  Urine Drug Screen  Result Value Ref Range   Opiates NONE DETECTED NONE DETECTED   Cocaine NONE DETECTED NONE DETECTED   Benzodiazepines NONE DETECTED NONE DETECTED   Amphetamines NONE DETECTED NONE DETECTED   Tetrahydrocannabinol NONE DETECTED NONE DETECTED   Barbiturates NONE DETECTED NONE DETECTED   No results found.      Domenic Moras, PA-C 03/25/15 [id number]  Shanon Rosser, MD 03/25/15 [id number]

## 2015-03-25 NOTE — ED Notes (Signed)
Called and spoke with mr. Lundeen about picking up his wife. Mr. Remick said he would be here in couple of hours to take pt to home. Vwilliams,rn.
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
## 2015-03-25 NOTE — ED Notes (Signed)
Pt discharged to home. DC instructions given. No concerns voiced. Pt advised to make f/u appt with primary. Voiced understanding. Left unit in wheelchair pushed by nurse tech. Left in good and stable condition. Vwilliams, rn.

## 2015-03-25 NOTE — Discharge Instructions (Signed)
Alcohol Intoxication Alcohol intoxication occurs when the amount of alcohol that a person has consumed impairs his or her ability to mentally and physically function. Alcohol directly impairs the normal chemical activity of the brain. Drinking large amounts of alcohol can lead to changes in mental function and behavior, and it can cause many physical effects that can be harmful.  Alcohol intoxication can range in severity from mild to very severe. Various factors can affect the level of intoxication that occurs, such as the person's age, gender, weight, frequency of alcohol consumption, and the presence of other medical conditions (such as diabetes, seizures, or heart conditions). Dangerous levels of alcohol intoxication may occur when people drink large amounts of alcohol in a short period (binge drinking). Alcohol can also be especially dangerous when combined with certain prescription medicines or "recreational" drugs. SIGNS AND SYMPTOMS Some common signs and symptoms of mild alcohol intoxication include:  Loss of coordination.  Changes in mood and behavior.  Impaired judgment.  Slurred speech. As alcohol intoxication progresses to more severe levels, other signs and symptoms will appear. These may include:  Vomiting.  Confusion and impaired memory.  Slowed breathing.  Seizures.  Loss of consciousness. DIAGNOSIS  Your health care provider will take a medical history and perform a physical exam. You will be asked about the amount and type of alcohol you have consumed. Blood tests will be done to measure the concentration of alcohol in your blood. In many places, your blood alcohol level must be lower than 80 mg/dL (0.08%) to legally drive. However, many dangerous effects of alcohol can occur at much lower levels.  TREATMENT  People with alcohol intoxication often do not require treatment. Most of the effects of alcohol intoxication are temporary, and they go away as the alcohol naturally  leaves the body. Your health care provider will monitor your condition until you are stable enough to go home. Fluids are sometimes given through an IV access tube to help prevent dehydration.  HOME CARE INSTRUCTIONS  Do not drive after drinking alcohol.  Stay hydrated. Drink enough water and fluids to keep your urine clear or pale yellow. Avoid caffeine.   Only take over-the-counter or prescription medicines as directed by your health care provider.  SEEK MEDICAL CARE IF:   You have persistent vomiting.   You do not feel better after a few days.  You have frequent alcohol intoxication. Your health care provider can help determine if you should see a substance use treatment counselor. SEEK IMMEDIATE MEDICAL CARE IF:   You become shaky or tremble when you try to stop drinking.   You shake uncontrollably (seizure).   You throw up (vomit) blood. This may be bright red or may look like black coffee grounds.   You have blood in your stool. This may be bright red or may appear as a black, tarry, bad smelling stool.   You become lightheaded or faint.  MAKE SURE YOU:   Understand these instructions.  Will watch your condition.  Will get help right away if you are not doing well or get worse. Document Released: 09/17/2005 Document Revised: 08/10/2013 Document Reviewed: 05/13/2013 [facility] Patient Information 2015 [place], [place]. This information is not intended to replace advice given to you by your health care provider. Make sure you discuss any questions you have with your health care provider.   Emergency Department Resource Guide 1) Find a Doctor and Pay Out of Pocket Although you won't have to find out who is covered by your  insurance plan, it is a good idea to ask around and get recommendations. You will then need to call the office and see if the doctor you have chosen will accept you as a new patient and what types of options they offer for patients who are self-pay.  Some doctors offer discounts or will set up payment plans for their patients who do not have insurance, but you will need to ask so you aren't surprised when you get to your appointment.  2) Contact Your Local Health Department Not all health departments have doctors that can see patients for sick visits, but many do, so it is worth a call to see if yours does. If you don't know where your local health department is, you can check in your phone book. The CDC also has a tool to help you locate your state's health department, and many state websites also have listings of all of their local health departments.  3) Find a [facility] If your illness is not likely to be very severe or complicated, you may want to try a walk in clinic. These are popping up all over the country in pharmacies, drugstores, and shopping centers. They're usually staffed by nurse practitioners or physician assistants that have been trained to treat common illnesses and complaints. They're usually fairly quick and inexpensive. However, if you have serious medical issues or chronic medical problems, these are probably not your best option.  No Primary Care Doctor: - Call Health Connect at  [phone] - they can help you locate a primary care doctor that  accepts your insurance, provides certain services, etc. - Physician Referral Service- [phone]  Chronic Pain Problems: Organization         Address  Phone   Notes  [facility]  [phone] Patients need to be referred by their primary care doctor.   Medication Assistance: Organization         Address  Phone   Notes  [facility] Medication [facility] [address] [place], [place] [address] [phone] --Must be a resident of [facility] -- Must have NO insurance coverage whatsoever (no Medicaid/ Medicare, etc.) -- The pt. MUST have a primary care doctor that directs their care regularly and follows them in the  community   MedAssist  [phone]   [employer]  [phone]    Agencies that provide inexpensive medical care: Organization         Address  Phone   Notes  [place]  [phone]   [facility]    [phone]   [facility] [place], [place] [address] [phone]   [place] [address], [place] [phone]   Planned Parenthood    [phone]   [facility]    [phone]   [place] [address], [place] Phone:  [phone], Fax:  [phone] Hours of Operation:  9 am - 6 pm, M-F.  Also accepts Medicaid/Medicare and self-pay.  [facility] for [place], [address], [place] Phone: [phone], Fax: [phone]. Hours of Operation:  8:30 am - 5:30 pm, M-F.  Also accepts Medicaid and self-pay.  [facility] [address] [place] Phone: [phone]   [place], [place], [place] [phone], Ext. [phone] Mondays &  Thursdays: 7-9 AM.  First 15 patients are seen on a first come, first serve basis.    [place] Providers:  Organization         Address  Phone   Notes  [facility] [address], [place] [phone] Also accepts self-pay patients.  [facility] [name], [place]  [phone]   [place], [address], [place] [phone]   [facility] [address] [place] [phone]   Lucianne Lei [address], [place]   [phone] Only accepts [place] Access [place] patients after they have their name applied to their card.   Self-Pay (no insurance) in [facility]:  Organization         Address  Phone   Notes  Sickle Cell Patients, [facility]  Internal Medicine [place] [phone]   [facility] [place] [phone]   [facility] [place]  [place], [place],  [phone]   [facility]/Dr. Osei-Bonsu  [address], [place] or [address], [place] [phone] Phone number for both [place] and [place] locations is the same.  Urgent Medical and [facility] [address] [place] [phone]   [facility] [address], [place] or [address] [phone]   [facility] [address] [place] [phone], phone; [phone], fax Sees patients 1st and 3rd Saturday of every month.  Must not qualify for public or private insurance (i.e. Medicaid, Medicare, [place] Health Choice, Veterans' Benefits)  Household income should be no more than 200% of the poverty level The clinic cannot treat you if you are pregnant or think you are pregnant  Sexually transmitted diseases are not treated at the clinic.    Dental Care: Organization         Address  Phone  Notes  [facility] Department of [facility] [phone] Accepts children up to age 69 who are enrolled in [place] or [place]; pregnant women with a Medicaid card; and children who have applied for Medicaid or [place] Health Choice, but were declined, whose parents can pay a reduced fee at time of service.  [facility] Department of [facility]  [address], [place] [phone] Accepts children up to age 46 who are enrolled in [place] or [place]; pregnant women with a Medicaid card; and children who have applied for Medicaid or  Health Choice, but were declined, whose parents can pay a reduced fee at time of service.  [place] Adult Dental Access PROGRAM  [place] [phone] Patients are seen by appointment only. Walk-ins are not accepted. [place] will see patients 74 years of age and older. Monday - Tuesday (8am-5pm) Most Wednesdays (8:30-5pm) $30 per visit, cash only  [facility] Adult Dental Access PROGRAM  [address], [facility] [phone] Patients are seen by appointment only. Walk-ins are not accepted. [place] will see patients 65 years of age and older. One Wednesday Evening (Monthly: Volunteer Based).  $30 per visit, cash only  [place]  [phone] for adults; Children under  age 86, call Graduate Pediatric Dentistry at [phone]. Children aged 26-14, please call [phone] to request a pediatric application.  Dental services are provided in all areas of dental care including fillings, crowns and bridges, complete and partial dentures, implants, gum treatment, root canals, and extractions. Preventive care is also provided. Treatment is provided to both adults and children. Patients are selected via a lottery and there is often a waiting list.   [facility] [address] [place]  [phone] [email or web address]   [facility] [address] [place], [place] [phone], Ext. [phone] Second and Fourth Thursday of each month, opens at 6:30 AM; Clinic ends at 9 AM.  Patients are seen on a first-come first-served basis, and a limited number are seen during each clinic.   [facility]  [address] Hillard Danker [place], [place] [phone]   Eligibility Requirements You must have lived in [place], [place], or [place] counties for at least the last three months.   You cannot be eligible for state or federal sponsored [employer], including [employer], [place], or [employer].   You generally cannot be eligible for healthcare insurance through your employer.    How to apply: Eligibility screenings are held every  Tuesday and Wednesday afternoon from 1:00 pm until 4:00 pm. You do not need an appointment for the interview!  [facility] [address] [place], [place]   [place]  [place] Department  [place]  [phone]    Behavioral Health Resources in the Community: Intensive Outpatient Programs Organization         Address  Phone  Notes  [facility] [place]. [address] [place], [place] [phone]   [facility] [address] [place], [place]   ADS: Alcohol & Drug Svcs [address] [place], [place]   [place] [address]  [place], [place] or [phone]   Substance Abuse Resources Organization         Address  Phone  Notes  Alcohol and Drug Services  [phone]   [place]  [phone]   [facility] [place]   Chinita Pester  [phone]   Residential & Outpatient Substance Abuse Program  [phone]   Psychological Services Organization         Address  Phone  Notes  [facility] [place]  [place]  [phone]   [place] [address] [place] or [phone]    Mobile Crisis Teams Organization         Address  Phone  Notes  [facility], Mobile Crisis Care Unit  [phone]   [facility]  [address] [place], [place]   [place] [address] [place] [phone]    Self-Help/Support Groups Organization         Address  Phone             Notes  [place]. of [place] - variety of support groups  [place] Call for more information  Narcotics Anonymous (NA), [facility] [address], [employer] [place]  2 meetings at this location    Special educational needs teacher         Address  Phone  Notes  [facility]idential [facility] [phone]  [address]    [place]  [phone]   [facility]  [address], [place] [address], [place], [place]   [place], [place] [phone] Admissions: 8am-3pm M-F  Incentives Substance [place] [address],    [place], [place] [phone]   [facility] [address] [place], [place], [place]   [facility] [address],  [place], [place]   [facility] - Intensive Outpatient [address]een [address], [place], [place]   [facility].,  [place], [place] [phone] or [phone]   [facility] Services (RTS) [address] [place], [place] Accepts Medicaid  [facility] [place] [address]  [place] [phone] Substance Abuse/Addiction Treatment   [facility] Organization         Address  Phone  Notes  [facility]  [phone]   Domenic Schwab, PhD [address] Arlis [place], [place]   [phone] or [phone]   [place], [place] [phone]   [facility] [address] [place], [place] [phone] Insurance/Medicaid/sponsorship through [facility] and Families [address], [address] [place]                                    [place], [place] [phone] [place] [address], [place] [phone]    Dr. Adele Schilder  [phone]   [facility] Dept. 1) [address], [place] 2) [place] 3)  [place] [address], [place] [phone]  [phone]   [place] [phone] or [phone] (After  Hours)
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
## 2015-04-03 ENCOUNTER — Telehealth: Payer: Self-pay

## 2015-04-03 NOTE — Telephone Encounter (Signed)
FYI CHelle

## 2015-04-03 NOTE — Telephone Encounter (Signed)
Chelle,  Pt's nurse case manager, Tiffany called to let you know that the patient is under her care. Pt will walk in to follow up with you in the next few days. Direct number [phone]

## 2015-04-04 NOTE — Telephone Encounter (Signed)
Note. Will look forward to their visit.

## 2015-05-18 ENCOUNTER — Encounter (HOSPITAL_COMMUNITY): Payer: Self-pay | Admitting: *Deleted

## 2015-05-18 ENCOUNTER — Emergency Department (HOSPITAL_COMMUNITY)
Admission: EM | Admit: 2015-05-18 | Discharge: 2015-05-18 | Discharge status: Against Medical Advice | Payer: BLUE CROSS/BLUE SHIELD | Attending: Emergency Medicine | Admitting: Emergency Medicine

## 2015-05-18 DIAGNOSIS — F329 Major depressive disorder, single episode, unspecified: Secondary | ICD-10-CM | POA: Insufficient documentation

## 2015-05-18 DIAGNOSIS — Z79899 Other long term (current) drug therapy: Secondary | ICD-10-CM | POA: Insufficient documentation

## 2015-05-18 DIAGNOSIS — F419 Anxiety disorder, unspecified: Secondary | ICD-10-CM | POA: Insufficient documentation

## 2015-05-18 DIAGNOSIS — Z862 Personal history of diseases of the blood and blood-forming organs and certain disorders involving the immune mechanism: Secondary | ICD-10-CM | POA: Insufficient documentation

## 2015-05-18 DIAGNOSIS — E669 Obesity, unspecified: Secondary | ICD-10-CM | POA: Insufficient documentation

## 2015-05-18 DIAGNOSIS — Z8709 Personal history of other diseases of the respiratory system: Secondary | ICD-10-CM | POA: Insufficient documentation

## 2015-05-18 DIAGNOSIS — Y998 Other external cause status: Secondary | ICD-10-CM | POA: Insufficient documentation

## 2015-05-18 DIAGNOSIS — W01198A Fall on same level from slipping, tripping and stumbling with subsequent striking against other object, initial encounter: Secondary | ICD-10-CM | POA: Insufficient documentation

## 2015-05-18 DIAGNOSIS — Y9289 Other specified places as the place of occurrence of the external cause: Secondary | ICD-10-CM | POA: Insufficient documentation

## 2015-05-18 DIAGNOSIS — Z043 Encounter for examination and observation following other accident: Secondary | ICD-10-CM | POA: Insufficient documentation

## 2015-05-18 DIAGNOSIS — Y9389 Activity, other specified: Secondary | ICD-10-CM | POA: Insufficient documentation

## 2015-05-18 DIAGNOSIS — W19XXXA Unspecified fall, initial encounter: Secondary | ICD-10-CM

## 2015-05-18 DIAGNOSIS — F10129 Alcohol abuse with intoxication, unspecified: Secondary | ICD-10-CM | POA: Insufficient documentation

## 2015-05-18 NOTE — ED Notes (Signed)
Bed: [facility] Expected date:  Expected time:  Means of arrival:  Comments: EMS- 49yo F, ETOH, fall/head lac

## 2015-05-18 NOTE — ED Notes (Signed)
EMS called out for frequent falls this week. "I don/' know why I keep falling." ETOH on board. Ten 40 ounce bottles around bed, pt states she drank them last night through to today.  Hematoma to posterior scalp. Refused C collar. Denies neck or back pain. A & O x 3, answering questions appropriately for EMS.  Upon arrival to room, combative wanting to pee. Stood up from stretcher, removed pants and threatened to pee on the floor if we didn't take her straight to the bathroom.

## 2015-05-18 NOTE — ED Notes (Signed)
States, "I don't need to be here, I didn't call the ambulance. I'm fine."

## 2015-05-18 NOTE — ED Notes (Signed)
MD made aware that Pt is wanting to leave.  Assessed Pt and explained risks of leaving AMA, including worsening of condition, brain bleed, and death.  Staff confirmed that Pt's ride is on the way.  This Agricultural consultant had Pt sign AMA form.
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
## 2015-05-29 ENCOUNTER — Encounter (HOSPITAL_COMMUNITY): Payer: Self-pay | Admitting: Emergency Medicine

## 2015-05-29 ENCOUNTER — Emergency Department (HOSPITAL_COMMUNITY)
Admission: EM | Admit: 2015-05-29 | Discharge: 2015-06-03 | Disposition: A | Discharge status: Home / Self Care | Payer: Federal, State, Local not specified - Other | Attending: Emergency Medicine | Admitting: Emergency Medicine

## 2015-05-29 DIAGNOSIS — F10230 Alcohol dependence with withdrawal, uncomplicated: Secondary | ICD-10-CM | POA: Diagnosis present

## 2015-05-29 DIAGNOSIS — F1020 Alcohol dependence, uncomplicated: Secondary | ICD-10-CM | POA: Insufficient documentation

## 2015-05-29 DIAGNOSIS — F1994 Other psychoactive substance use, unspecified with psychoactive substance-induced mood disorder: Secondary | ICD-10-CM | POA: Diagnosis present

## 2015-05-29 DIAGNOSIS — Z862 Personal history of diseases of the blood and blood-forming organs and certain disorders involving the immune mechanism: Secondary | ICD-10-CM | POA: Insufficient documentation

## 2015-05-29 DIAGNOSIS — Z9884 Bariatric surgery status: Secondary | ICD-10-CM | POA: Insufficient documentation

## 2015-05-29 DIAGNOSIS — F329 Major depressive disorder, single episode, unspecified: Secondary | ICD-10-CM | POA: Insufficient documentation

## 2015-05-29 DIAGNOSIS — F419 Anxiety disorder, unspecified: Secondary | ICD-10-CM | POA: Insufficient documentation

## 2015-05-29 DIAGNOSIS — E669 Obesity, unspecified: Secondary | ICD-10-CM | POA: Insufficient documentation

## 2015-05-29 DIAGNOSIS — Z79899 Other long term (current) drug therapy: Secondary | ICD-10-CM | POA: Insufficient documentation

## 2015-05-29 DIAGNOSIS — Z62810 Personal history of physical and sexual abuse in childhood: Secondary | ICD-10-CM | POA: Insufficient documentation

## 2015-05-29 NOTE — ED Notes (Signed)
Pt brought in by [employer] under IVC  Paperwork states pt has been laying in bed in her own urine and feces  Pt reports suicidal ideations with a plan to cut her wrists  Has hx of self mutilating by cutting her wrists  Pt is paranoid and fears people are coming after her

## 2015-05-29 NOTE — ED Notes (Signed)
Bed: WTR9 Expected date:  Expected time:  Means of arrival:  Comments: 

## 2015-05-30 DIAGNOSIS — R45851 Suicidal ideations: Secondary | ICD-10-CM | POA: Diagnosis not present

## 2015-05-30 DIAGNOSIS — F10230 Alcohol dependence with withdrawal, uncomplicated: Secondary | ICD-10-CM | POA: Diagnosis not present

## 2015-05-30 LAB — COMPREHENSIVE METABOLIC PANEL
ALT: 32 U/L (ref 14–54)
ANION GAP: 17 — AB (ref 5–15)
AST: 79 U/L — AB (ref 15–41)
Albumin: 4.9 g/dL (ref 3.5–5.0)
Alkaline Phosphatase: 101 U/L (ref 38–126)
BUN: 6 mg/dL (ref 6–20)
CALCIUM: 9.1 mg/dL (ref 8.9–10.3)
CO2: 23 mmol/L (ref 22–32)
CREATININE: 0.63 mg/dL (ref 0.44–1.00)
Chloride: 97 mmol/L — ABNORMAL LOW (ref 101–111)
GFR calc non Af Amer: >60 mL/min (ref >60)
Glucose, Bld: 107 mg/dL — ABNORMAL HIGH (ref 65–99)
Potassium: 4 mmol/L (ref 3.5–5.1)
Sodium: 137 mmol/L (ref 135–145)
TOTAL PROTEIN: 9.1 g/dL — AB (ref 6.5–8.1)
Total Bilirubin: 0.6 mg/dL (ref 0.3–1.2)

## 2015-05-30 LAB — CBC WITH DIFFERENTIAL/PLATELET
Basophils Absolute: 0 10*3/uL (ref 0.0–0.1)
Basophils Relative: 1 % (ref 0–1)
Eosinophils Absolute: 0 10*3/uL (ref 0.0–0.7)
Eosinophils Relative: 1 % (ref 0–5)
HEMATOCRIT: 39.9 % (ref 36.0–46.0)
HEMOGLOBIN: 14 g/dL (ref 12.0–15.0)
Lymphocytes Relative: 41 % (ref 12–46)
Lymphs Abs: 1.6 10*3/uL (ref 0.7–4.0)
MCH: 32.9 pg (ref 26.0–34.0)
MCHC: 35.1 g/dL (ref 30.0–36.0)
MCV: 93.7 fL (ref 78.0–100.0)
MONOS PCT: 7 % (ref 3–12)
Monocytes Absolute: 0.3 10*3/uL (ref 0.1–1.0)
Neutro Abs: 2.1 10*3/uL (ref 1.7–7.7)
Neutrophils Relative %: 52 % (ref 43–77)
PLATELETS: 90 10*3/uL — AB (ref 150–400)
RBC: 4.26 MIL/uL (ref 3.87–5.11)
RDW: 14.6 % (ref 11.5–15.5)
WBC: 4 10*3/uL (ref 4.0–10.5)

## 2015-05-30 LAB — ACETAMINOPHEN LEVEL: Acetaminophen (Tylenol), Serum: <10 ug/mL — ABNORMAL LOW (ref 10–30)

## 2015-05-30 LAB — BASIC METABOLIC PANEL
ANION GAP: 16 — AB (ref 5–15)
BUN: 5 mg/dL — ABNORMAL LOW (ref 6–20)
CALCIUM: 9.2 mg/dL (ref 8.9–10.3)
CO2: 25 mmol/L (ref 22–32)
CREATININE: 0.62 mg/dL (ref 0.44–1.00)
Chloride: 98 mmol/L — ABNORMAL LOW (ref 101–111)
GLUCOSE: 100 mg/dL — AB (ref 65–99)
Potassium: 4 mmol/L (ref 3.5–5.1)
SODIUM: 139 mmol/L (ref 135–145)

## 2015-05-30 LAB — CBC
HCT: 40.5 % (ref 36.0–46.0)
Hemoglobin: 14.2 g/dL (ref 12.0–15.0)
MCH: 32.7 pg (ref 26.0–34.0)
MCHC: 35.1 g/dL (ref 30.0–36.0)
MCV: 93.3 fL (ref 78.0–100.0)
Platelets: 101 10*3/uL — ABNORMAL LOW (ref 150–400)
RBC: 4.34 MIL/uL (ref 3.87–5.11)
RDW: 14.4 % (ref 11.5–15.5)
WBC: 4.6 10*3/uL (ref 4.0–10.5)

## 2015-05-30 LAB — SALICYLATE LEVEL: Salicylate Lvl: <4 mg/dL (ref 2.8–30.0)

## 2015-05-30 LAB — ETHANOL
Alcohol, Ethyl (B): 388 mg/dL (ref <5)
Alcohol, Ethyl (B): 445 mg/dL (ref <5)

## 2015-05-30 LAB — RAPID URINE DRUG SCREEN, HOSP PERFORMED
Amphetamines: NOT DETECTED
BARBITURATES: NOT DETECTED
Benzodiazepines: NOT DETECTED
COCAINE: NOT DETECTED
OPIATES: NOT DETECTED
Tetrahydrocannabinol: NOT DETECTED

## 2015-05-30 MED ORDER — GABAPENTIN 300 MG PO CAPS
300.0000 mg | ORAL_CAPSULE | Freq: Three times a day (TID) | ORAL | Status: DC
  Administered 2015-05-30 – 2015-06-03 (×11): 300 mg via ORAL
  Filled 2015-05-30 (×12): qty 1

## 2015-05-30 MED ORDER — LORAZEPAM 2 MG/ML IJ SOLN: 0.0000 mg | Freq: Four times a day (QID) | INTRAMUSCULAR | Status: DC

## 2015-05-30 MED ORDER — THIAMINE HCL 100 MG/ML IJ SOLN: 100.0000 mg | Freq: Every day | INTRAMUSCULAR | Status: DC

## 2015-05-30 MED ORDER — LORAZEPAM 1 MG PO TABS
0.0000 mg | ORAL_TABLET | Freq: Two times a day (BID) | ORAL | Status: DC
  Administered 2015-05-30: 2 mg via ORAL
  Filled 2015-05-30: qty 2

## 2015-05-30 MED ORDER — GABAPENTIN 600 MG PO TABS
300.0000 mg | ORAL_TABLET | Freq: Three times a day (TID) | ORAL | Status: DC
  Filled 2015-05-30 (×2): qty 0.5

## 2015-05-30 MED ORDER — ACETAMINOPHEN 325 MG PO TABS
650.0000 mg | ORAL_TABLET | ORAL | Status: DC | PRN
  Administered 2015-05-30 – 2015-06-01 (×5): 650 mg via ORAL
  Filled 2015-05-30 (×5): qty 2

## 2015-05-30 MED ORDER — LORAZEPAM 2 MG/ML IJ SOLN: 0.0000 mg | Freq: Two times a day (BID) | INTRAMUSCULAR | Status: DC

## 2015-05-30 MED ORDER — LORAZEPAM 1 MG PO TABS
1.0000 mg | ORAL_TABLET | Freq: Four times a day (QID) | ORAL | Status: AC | PRN
  Administered 2015-06-01: 1 mg via ORAL
  Filled 2015-05-30: qty 1

## 2015-05-30 MED ORDER — LORAZEPAM 1 MG PO TABS
0.0000 mg | ORAL_TABLET | Freq: Four times a day (QID) | ORAL | Status: DC
Start: 2015-05-30 — End: 2015-05-30
  Administered 2015-05-30: 1 mg via ORAL
  Filled 2015-05-30: qty 1

## 2015-05-30 MED ORDER — ONDANSETRON HCL 4 MG PO TABS
4.0000 mg | ORAL_TABLET | Freq: Once | ORAL | Status: AC
  Administered 2015-05-30: 4 mg via ORAL
  Filled 2015-05-30: qty 1

## 2015-05-30 MED ORDER — DULOXETINE HCL 30 MG PO CPEP
30.0000 mg | ORAL_CAPSULE | Freq: Every day | ORAL | Status: DC
  Administered 2015-05-30 – 2015-06-03 (×5): 30 mg via ORAL
  Filled 2015-05-30 (×5): qty 1

## 2015-05-30 MED ORDER — TRAZODONE HCL 100 MG PO TABS
100.0000 mg | ORAL_TABLET | Freq: Every evening | ORAL | Status: DC | PRN
  Administered 2015-06-01: 100 mg via ORAL
  Filled 2015-05-30: qty 1

## 2015-05-30 MED ORDER — LORAZEPAM 1 MG PO TABS
0.0000 mg | ORAL_TABLET | Freq: Four times a day (QID) | ORAL | Status: AC
  Administered 2015-05-31 – 2015-06-01 (×4): 1 mg via ORAL
  Filled 2015-05-30 (×5): qty 1

## 2015-05-30 MED ORDER — VITAMIN B-1 100 MG PO TABS
100.0000 mg | ORAL_TABLET | Freq: Every day | ORAL | Status: DC
  Administered 2015-05-30 – 2015-06-03 (×5): 100 mg via ORAL
  Filled 2015-05-30 (×5): qty 1

## 2015-05-30 MED ORDER — ONDANSETRON HCL 4 MG PO TABS
4.0000 mg | ORAL_TABLET | Freq: Three times a day (TID) | ORAL | Status: DC | PRN
  Administered 2015-05-30: 4 mg via ORAL
  Filled 2015-05-30: qty 1

## 2015-05-30 MED ORDER — LORAZEPAM 2 MG/ML IJ SOLN: 1.0000 mg | Freq: Four times a day (QID) | INTRAMUSCULAR | Status: AC | PRN

## 2015-05-30 MED ORDER — LORAZEPAM 1 MG PO TABS
0.0000 mg | ORAL_TABLET | Freq: Two times a day (BID) | ORAL | Status: AC
  Administered 2015-06-01 – 2015-06-02 (×2): 1 mg via ORAL
  Filled 2015-05-30 (×2): qty 1
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
## 2015-05-30 NOTE — Evaluation (Signed)
Physical Therapy Evaluation Patient Details Name: Natalie Mcdaniel MRN: [id number] DOB: [birth year] Today's Date: 05/30/2015   History of Present Illness  49 yo female admitted with alcohol intoxication, suicidal ideation, found at home in own bodily fluids.   Clinical Impression  On eval, pt required Min assist for mobility-able to ambulate ~15 feet with RW (pt declined to ambulate beyond this distance). Demonstrates general weakness, decreased activity tolerance, and impaired gait and balance. Pt reported 9/10 pain in bil feet. At this time, recommend ST rehab at [facility]. Do not feel pt can safely manage at home alone at this time (significant other is gone during day per pt).     Follow Up Recommendations SNF;Supervision/Assistance - 24 hour    Equipment Recommendations  None recommended by PT    Recommendations for Other Services OT consult     Precautions / Restrictions Precautions Precautions: Fall Restrictions Weight Bearing Restrictions: No      Mobility  Bed Mobility Overal bed mobility: Needs Assistance Bed Mobility: Supine to Sit;Sit to Supine     Supine to sit: Min guard Sit to supine: Min guard   General bed mobility comments: close guard for safety  Transfers Overall transfer level: Needs assistance Equipment used: Rolling walker (2 wheeled) Transfers: Sit to/from Stand Sit to Stand: Min assist         General transfer comment: Assist to rise, stabilize, control descent.   Ambulation/Gait Ambulation/Gait assistance: Min assist Ambulation Distance (Feet): 15 Feet Assistive device: Rolling walker (2 wheeled) Gait Pattern/deviations: Step-through pattern;Decreased stride length;Trunk flexed     General Gait Details: Assist to stabilize. slow gait speed. Pt declined to ambulate beyond this distance  Stairs            Wheelchair Mobility    Modified Rankin (Stroke Patients Only)       Balance Overall balance assessment: Needs  assistance;History of Falls         Standing balance support: Bilateral upper extremity supported;During functional activity Standing balance-Leahy Scale: Poor                               Pertinent Vitals/Pain Pain Assessment: 0-10 Pain Score: 9  Pain Location: bil feet Pain Descriptors / Indicators: Tingling Pain Intervention(s): Limited activity within patient's tolerance    Home Living Family/patient expects to be discharged to:: Private residence Living Arrangements: Spouse/significant other Available Help at Discharge: Family (significant other works during day) Type of Home: House         Home Equipment: Environmental consultant - 2 wheels;Shower seat      Prior Function Level of Independence: Independent with assistive device(s)   Gait / Transfers Assistance Needed: frequent falls noted in chart history  ADL's / Homemaking Assistance Needed: used RW in tub shower        Hand Dominance        Extremity/Trunk Assessment   Upper Extremity Assessment: Generalized weakness           Lower Extremity Assessment: Generalized weakness      Cervical / Trunk Assessment: Normal  Communication   Communication: No difficulties  Cognition Arousal/Alertness: Awake/alert Behavior During Therapy: Flat affect Overall Cognitive Status: Within Functional Limits for tasks assessed                      General Comments      Exercises        Assessment/Plan    PT  Assessment Patient needs continued PT services  PT Diagnosis Difficulty walking;Generalized weakness;Altered mental status   PT Problem List Decreased strength;Decreased activity tolerance;Decreased balance;Decreased mobility;Pain;Decreased knowledge of use of DME  PT Treatment Interventions DME instruction;Gait training;Functional mobility training;Therapeutic activities;Balance training;Therapeutic exercise;Patient/family education   PT Goals (Current goals can be found in the Care Plan  section) Acute Rehab PT Goals Patient Stated Goal: none stated PT Goal Formulation: With patient Time For Goal Achievement: 06/13/15 Potential to Achieve Goals: Fair    Frequency Min 3X/week   Barriers to discharge        Co-evaluation               End of Session Equipment Utilized During Treatment: Gait belt Activity Tolerance: Patient limited by pain;Patient limited by fatigue Patient left: in bed;with call bell/phone within reach;with nursing/sitter in room      Functional Assessment Tool Used: clinical judgement Functional Limitation: Mobility: Walking and moving around Mobility: Walking and Moving Around Current Status (P1121): At least 20 percent but less than 40 percent impaired, limited or restricted Mobility: Walking and Moving Around Goal Status [phone]): At least 1 percent but less than 20 percent impaired, limited or restricted    Time: 1531-1539 PT Time Calculation (min) (ACUTE ONLY): 8 min   Charges:   PT Evaluation $Initial PT Evaluation Tier I: 1 Procedure     PT G Codes:   PT G-Codes **NOT FOR INPATIENT CLASS** Functional Assessment Tool Used: clinical judgement Functional Limitation: Mobility: Walking and moving around Mobility: Walking and Moving Around Current Status (X5072): At least 20 percent but less than 40 percent impaired, limited or restricted Mobility: Walking and Moving Around Goal Status [phone]): At least 1 percent but less than 20 percent impaired, limited or restricted    Weston Anna, MPT Pager: [phone]

## 2015-05-30 NOTE — ED Notes (Signed)
Pt yelling out in pain, states "I wish I was just dead, if I had a knife I would slit my throat.", sitter at bedside.
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
## 2015-05-30 NOTE — BH Assessment (Signed)
[facility] Assessment Progress Note  The following facilities have been contacted to seek placement for this pt, with results as noted:  Beds available, information sent, decision pending:  [facility]  At capacity:  [facility] Theressa Millard [facility], [place] Triage Specialist [phone]
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
## 2015-05-30 NOTE — Progress Notes (Addendum)
Nunzio Cory at [facility] states patient is declined due to not ambulatory.  Verlon Setting, [place] Disposition staff 05/30/2015 7:51 PM

## 2015-05-30 NOTE — BH Assessment (Signed)
Assessment completed. Consulted Patriciaann Clan, PA-C who recommended that pt be re-evaluated when her BAL is 250 or lower. Dr. Claudine Mouton has been informed that pt will be assess in the am by psychiatry.

## 2015-05-31 DIAGNOSIS — R45851 Suicidal ideations: Secondary | ICD-10-CM | POA: Diagnosis not present

## 2015-05-31 DIAGNOSIS — F10230 Alcohol dependence with withdrawal, uncomplicated: Secondary | ICD-10-CM | POA: Diagnosis not present

## 2015-05-31 NOTE — ED Notes (Signed)
Pt at nurses station aggitated b/c she is unable to use phone, pt stating "I'm going to complain on yall, I'm going to tell everyone to go to the other [place], I'm sure there better".
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
## 2015-05-31 NOTE — BH Assessment (Signed)
[facility] Assessment Progress Note  Referral to [facility] has been initiated for this pt.  The [facility] has authorized referral, authorization [id number], from 05/31/2015 - 06/06/2015.  Please note that authorization does not mean that pt has been accepted to the facility.  Barbara at [facility] took demographic information by telephone and agreed to accept referral information.  At 16:47 Amore at [facility] reported that their fax machine is in the process of receiving the transmission.  Decision is pending at this time.  Jalene Mullet, MA Triage Specialist [phone]

## 2015-06-01 DIAGNOSIS — F10230 Alcohol dependence with withdrawal, uncomplicated: Secondary | ICD-10-CM | POA: Diagnosis not present

## 2015-06-01 LAB — ETHANOL: Alcohol, Ethyl (B): <5 mg/dL (ref <5)

## 2015-06-01 MED ORDER — ZIPRASIDONE MESYLATE 20 MG IM SOLR
20.0000 mg | Freq: Once | INTRAMUSCULAR | Status: AC
  Administered 2015-06-01: 20 mg via INTRAMUSCULAR
  Filled 2015-06-01: qty 20

## 2015-06-01 NOTE — Progress Notes (Signed)
Pt stating she needs to leave so that she can make her house payment. Explained to the pt that she was not able to leave at this time due to her safety. Pt states she will just walk out and leave, she does not have to be here. Pt attempting to walk out of door, security called. Pt walked back to the room without the walker for assistance. Pt went back to the room and threw pears at the sitter outside of the room.

## 2015-06-01 NOTE — ED Provider Notes (Signed)
5:48 AM  Patient being combative with nursing staff, ripping off wires and destroying her room. Patient required restraints and IM Geodon. It required multiple people to control her.  Merryl Hacker, MD 06/01/15 [phone]

## 2015-06-01 NOTE — ED Notes (Signed)
Pt came to nurses station yelling and cussing at staff, started picking up and ripping wires from computers and phones, stating "you have my shit, I'm messing with your shit". Security and charge nurse called to assist w/ pt, medication to be given, pt threatening to kick and hit staff, pt yelling "call 911, I'm suing everyone, you won't get paid you white cracker". Pt yelling "I want to leave, yall are holding me against my will".

## 2015-06-01 NOTE — Progress Notes (Signed)
Physical Therapy Treatment Patient Details Name: Natalie Mcdaniel MRN: [id number] DOB: 12-Jul-1966 Today's Date: 06/01/2015    History of Present Illness 49 yo female admitted with alcohol intoxication, suicidal ideation, found at home in own bodily fluids.     PT Comments    Progressing with mobility. Participated well with session. Pt tolerated increased ambulation distance well onto today. Rated pain in feet 2/10 on today while mobilizing. Discussed d/c plan-pt declines SNF placement. Since pt declines SNF will recommend HHPT.   Follow Up Recommendations  SNF; Intermittent supervision (pt declines snf placement, so will recommend HHPT)     Equipment Recommendations  None recommended by PT (pt reports she already has a RW)    Recommendations for Other Services OT consult     Precautions / Restrictions Precautions Precautions: Fall Restrictions Weight Bearing Restrictions: No    Mobility  Bed Mobility Overal bed mobility: Modified Independent                Transfers Overall transfer level: Modified independent   Transfers: Sit to/from Stand              Ambulation/Gait Ambulation/Gait assistance: Supervision Ambulation Distance (Feet): 75 Feet Assistive device: Rolling walker (2 wheeled) Gait Pattern/deviations: Step-through pattern;Decreased step length - left;Decreased step length - right;Decreased stride length     General Gait Details: slow gait speed. short steps bilaterally likely due to neuropathy/pain. Pt tolerated increased distance well. Pain rated 2/10. No LOb with use of walker   Stairs            Wheelchair Mobility    Modified Rankin (Stroke Patients Only)       Balance                                    Cognition Arousal/Alertness: Awake/alert Behavior During Therapy: WFL for tasks assessed/performed Overall Cognitive Status: Within Functional Limits for tasks assessed                       Exercises      General Comments        Pertinent Vitals/Pain Pain Assessment: 0-10 Pain Score: 2  Pain Location: bil feet Pain Descriptors / Indicators: Tingling Pain Intervention(s): Monitored during session    Home Living                      Prior Function            PT Goals (current goals can now be found in the care plan section) Progress towards PT goals: Progressing toward goals    Frequency  Min 3X/week    PT Plan Current plan remains appropriate    Co-evaluation             End of Session Equipment Utilized During Treatment: Gait belt Activity Tolerance: Patient tolerated treatment well Patient left: in bed;with call bell/phone within reach;with nursing/sitter in room     Time: 1139-1147 PT Time Calculation (min) (ACUTE ONLY): 8 min  Charges:  $Gait Training: 8-22 mins                    G Codes:      Weston Anna, MPT Pager: [phone]
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
## 2015-06-01 NOTE — ED Notes (Signed)
Pt is awake and alert. Denies SI/HI d/c soft wrist,ankle and waist restraints. Assisted pt to restroom. Will continue to monitor for safety.

## 2015-06-01 NOTE — BH Assessment (Signed)
[facility] Assessment Progress Note  At the recommendation of [place], pt has been referred to [facility].  At 13:51 Raquel Sarna at the [facility] was contacted.  She reports that [facility] authorization obtained yesterday (05/31/2015) will be transferred; authorization #[id number] from 05/31/2015 - 06/06/2015.  Please note that authorization does not mean that pt has been accepted to the facility.  A call was placed to [employer] and this Probation officer was told to fax information, which I subsequently completed.  At 15:02 I called again and spoke to [place], who confirmed receipt of information.  As of this writing a decision is pending.  Jalene Mullet, MA Triage Specialist [phone]

## 2015-06-01 NOTE — BH Assessment (Signed)
[facility] Assessment Progress Note  At 10:17 I spoke to Robinette at [facility].  She reports that after staffing pt, they have determined that pt needs to be referred to [facility].  Jalene Mullet, MA Triage Specialist [phone]

## 2015-06-02 DIAGNOSIS — F10230 Alcohol dependence with withdrawal, uncomplicated: Secondary | ICD-10-CM

## 2015-06-02 NOTE — ED Notes (Signed)
Patient is resting comfortably. 

## 2015-06-02 NOTE — ED Notes (Signed)
Pt stating she needs to leave so that she can make her house payment. Explained to the pt that she was not able to leave at this time due to her safety. Pt states she will just walk out and leave, she does not have to be here. Pt attempting to walk out of door, security called. Pt walked back to the room without the walker for assistance. Pt went back to the room and threw pears at the sitter outside of the room.
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
## 2015-06-03 MED ORDER — DULOXETINE HCL 30 MG PO CPEP: 30.0000 mg | ORAL_CAPSULE | Freq: Every day | ORAL | Status: DC

## 2015-06-03 MED ORDER — GABAPENTIN 300 MG PO CAPS: 300.0000 mg | ORAL_CAPSULE | Freq: Three times a day (TID) | ORAL | Status: DC

## 2015-06-03 MED ORDER — LOPERAMIDE HCL 2 MG PO CAPS
2.0000 mg | ORAL_CAPSULE | Freq: Once | ORAL | Status: AC
  Administered 2015-06-03: 2 mg via ORAL
  Filled 2015-06-03: qty 1

## 2015-06-03 MED ORDER — TRAZODONE HCL 100 MG PO TABS: 50.0000 mg | ORAL_TABLET | Freq: Every evening | ORAL | Status: DC | PRN

## 2015-06-03 NOTE — ED Notes (Signed)
Discharge instructions and prescriptions reviewed with patient utilizing teach back method. Patient discharged to home .
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
## 2015-06-03 NOTE — BHH Counselor (Signed)
Per Dr. Harrington Challenger and Reginold Agent recommended discharging patient with SA outpatient resources. LCSW entered the patients room to discuss her discharge.  Patient denies SI/HI and psychosis and states that she is ready to go home. Patient states that she feels safe being discharged. Patient states that she has not attended substance abuse treatement and would be interested in individual counseling. LCSW reviewed the list of Outpatient SA facilities and answered patients questions. LCSW encouraged the patient to follow up with treatment. Patient states that she likes the way she feels when she is sober but she struggles with not drinking.  Patient states that she will follow up with outpatient SA treatment at this time. Patient states that she will need to contact transportation to get home and she feels comfortable doing that at this time.

## 2015-06-03 NOTE — B[facility] Suicide Risk Assessment (Signed)
Suicide Risk Assessment  Discharge Assessment   [facility] Discharge Suicide Risk Assessment   Demographic Factors:  Low socioeconomic status, Living alone and Unemployed  Total Time spent with patient: 20 minutes  Musculoskeletal: Strength & Muscle Tone: within normal limits Gait & Station: normal Patient leans: N/A  Psychiatric Specialty Exam:     Blood pressure 119/72, pulse 72, temperature 98.2 F (36.8 C), temperature source Oral, resp. rate 18, last menstrual period 08/31/2013, SpO2 98 %.There is no weight on file to calculate BMI.  General Appearance: Casual  Eye Contact::  Good  Speech:  Clear and Coherent and Normal Rate409  Volume:  Normal  Mood:  Euthymic  Affect:  Congruent  Thought Process:  Coherent  Orientation:  Full (Time, Place, and Person)  Thought Content:  WDL  Suicidal Thoughts:  No  Homicidal Thoughts:  No  Memory:  Immediate;   Good Recent;   Good Remote;   Good  Judgement:  Good  Insight:  Good  Psychomotor Activity:  Normal  Concentration:  Good  Recall:  NA  Fund of Knowledge:Good  Language: Good  Akathisia:  NA  Handed:  Right  AIMS (if indicated):     Assets:  Desire for Improvement  Sleep:     Cognition: WNL  ADL's:  Intact      Has this patient used any form of tobacco in the last 30 days? (Cigarettes, Smokeless Tobacco, Cigars, and/or Pipes) N/A  Mental Status Per Nursing Assessment::   On Admission:     Current Mental Status by Physician: NA  Loss Factors: NA  Historical Factors: NA  Risk Reduction Factors:   Living with another person, especially a relative  Continued Clinical Symptoms:  Depression:   Insomnia Alcohol/Substance Abuse/Dependencies  Cognitive Features That Contribute To Risk:  Polarized thinking    Suicide Risk:  Minimal: No identifiable suicidal ideation.  Patients presenting with no risk factors but with morbid ruminations; may be classified as minimal risk based on the severity of the depressive  symptoms  Principal Problem: Alcohol dependence with uncomplicated withdrawal Discharge Diagnoses:  Patient Active Problem List   Diagnosis Date Noted  . Alcohol dependence with uncomplicated withdrawal [W10.932]     Priority: High  . Skin erosion [L98.9]   . Cellulitis [L03.90] 03/01/2015  . Hyponatremia [E87.1] 03/01/2015  . Transaminitis [R74.0] 03/01/2015  . Decubitus ulcer [L89.90] 03/01/2015  . 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 Severe recurrent major depression without psychotic features [F33.2] 12/20/2014  . Substance induced mood disorder [F19.94] 12/20/2014  . Neuropathy [G62.9] 12/20/2014  . Alcohol dependence [F10.20] 12/19/2014  . Depression [F32.9] 02/05/2012      Plan Of Care/Follow-up recommendations:  Activity:  as tolerated Diet:  regular  Is patient on multiple antipsychotic therapies at discharge:  No   Has Patient had three or more failed trials of antipsychotic monotherapy by history:  No  Recommended Plan for Multiple Antipsychotic Therapies: NA    Delfin Gant   PMHNP-BC 06/03/2015, 2:17 PM   Patient seen and I agree with treatment plan  Levonne Spiller M.D.

## 2015-06-13 ENCOUNTER — Emergency Department (HOSPITAL_COMMUNITY): Payer: BLUE CROSS/BLUE SHIELD

## 2015-06-13 ENCOUNTER — Emergency Department (HOSPITAL_COMMUNITY)
Admission: EM | Admit: 2015-06-13 | Discharge: 2015-06-14 | Disposition: A | Discharge status: Home / Self Care | Payer: BLUE CROSS/BLUE SHIELD | Attending: Emergency Medicine | Admitting: Emergency Medicine

## 2015-06-13 ENCOUNTER — Encounter (HOSPITAL_COMMUNITY): Payer: Self-pay | Admitting: Emergency Medicine

## 2015-06-13 DIAGNOSIS — W010XXA Fall on same level from slipping, tripping and stumbling without subsequent striking against object, initial encounter: Secondary | ICD-10-CM | POA: Insufficient documentation

## 2015-06-13 DIAGNOSIS — Z043 Encounter for examination and observation following other accident: Secondary | ICD-10-CM | POA: Insufficient documentation

## 2015-06-13 DIAGNOSIS — Y939 Activity, unspecified: Secondary | ICD-10-CM | POA: Insufficient documentation

## 2015-06-13 DIAGNOSIS — Y908 Blood alcohol level of 240 mg/100 ml or more: Secondary | ICD-10-CM | POA: Insufficient documentation

## 2015-06-13 DIAGNOSIS — F10920 Alcohol use, unspecified with intoxication, uncomplicated: Secondary | ICD-10-CM

## 2015-06-13 DIAGNOSIS — Y999 Unspecified external cause status: Secondary | ICD-10-CM | POA: Insufficient documentation

## 2015-06-13 DIAGNOSIS — Z9884 Bariatric surgery status: Secondary | ICD-10-CM | POA: Insufficient documentation

## 2015-06-13 DIAGNOSIS — D75839 Thrombocytosis, unspecified: Secondary | ICD-10-CM

## 2015-06-13 DIAGNOSIS — F329 Major depressive disorder, single episode, unspecified: Secondary | ICD-10-CM | POA: Insufficient documentation

## 2015-06-13 DIAGNOSIS — F419 Anxiety disorder, unspecified: Secondary | ICD-10-CM | POA: Insufficient documentation

## 2015-06-13 DIAGNOSIS — Z8709 Personal history of other diseases of the respiratory system: Secondary | ICD-10-CM | POA: Insufficient documentation

## 2015-06-13 DIAGNOSIS — D473 Essential (hemorrhagic) thrombocythemia: Secondary | ICD-10-CM | POA: Insufficient documentation

## 2015-06-13 DIAGNOSIS — Y929 Unspecified place or not applicable: Secondary | ICD-10-CM | POA: Insufficient documentation

## 2015-06-13 DIAGNOSIS — F10120 Alcohol abuse with intoxication, uncomplicated: Secondary | ICD-10-CM | POA: Insufficient documentation

## 2015-06-13 MED ORDER — IBUPROFEN 200 MG PO TABS
600.0000 mg | ORAL_TABLET | Freq: Once | ORAL | Status: AC
  Administered 2015-06-13: 600 mg via ORAL
  Filled 2015-06-13 (×2): qty 3

## 2015-06-13 NOTE — ED Notes (Addendum)
Pt presents via EMS for ETOH. Reports "24 cans of beer". Combative in route. Ambulatory to restroom with walker upon arrival.  Last VS 100HR, 119/87, 97% RA, CBG 100, 18Resp.

## 2015-06-13 NOTE — ED Notes (Signed)
Pt returned from CT °

## 2015-06-13 NOTE — ED Notes (Addendum)
Pt presents to ED with signs and symptoms of alcohol intoxication.  She is shouting commands loudly into the hallway and exhibiting slurred speech and minor disorientation. She is demanding food, ice water, and the [employer] on TV.  She informed this RN that she does not like people and does not want to be bothered. She is also fixated on her missing glasses; a search of the room was unsuccessful and her glasses were not located.

## 2015-06-13 NOTE — ED Notes (Signed)
Pt shouting into the hallway again and crying in her room.  She reports that she is "having a seizure" and has a headache at 10/10.  No visible signs of seizure are present.  She also states that she is "having the detox" and that she is used to drinking "every second of every day" so the time she has spent here without alcohol is "going to make her shake her brains out of her skull."  RN explained that she has only been in the ED for a couple of hours and that the provider will order meds as needed to manage her symptoms and pain.  MD notified of pain and reported seizure. Applied seizure pads to the siderails as a precaution.
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
## 2015-06-13 NOTE — ED Notes (Signed)
Pt is very anxious about what she feels to be an impending seizure due to alcohol withdrawals.  RN explained that she is not currently having a seizure.  Pt then agreed to take Ibuprofen for her headache.  Phlebotomy draw in progress at the bedside.

## 2015-06-13 NOTE — ED Notes (Signed)
Bed: WA18 Expected date:  Expected time:  Means of arrival:  Comments: Hold 

## 2015-06-14 LAB — CBC WITH DIFFERENTIAL/PLATELET
Basophils Absolute: 0.2 10*3/uL — ABNORMAL HIGH (ref 0.0–0.1)
Basophils Relative: 3 % — ABNORMAL HIGH (ref 0–1)
EOS ABS: 0 10*3/uL (ref 0.0–0.7)
Eosinophils Relative: 0 % (ref 0–5)
HCT: 41 % (ref 36.0–46.0)
HEMOGLOBIN: 13.8 g/dL (ref 12.0–15.0)
LYMPHS ABS: 3.4 10*3/uL (ref 0.7–4.0)
Lymphocytes Relative: 49 % — ABNORMAL HIGH (ref 12–46)
MCH: 32.2 pg (ref 26.0–34.0)
MCHC: 33.7 g/dL (ref 30.0–36.0)
MCV: 95.6 fL (ref 78.0–100.0)
MONOS PCT: 5 % (ref 3–12)
Monocytes Absolute: 0.3 10*3/uL (ref 0.1–1.0)
NEUTROS ABS: 3 10*3/uL (ref 1.7–7.7)
Neutrophils Relative %: 43 % (ref 43–77)
Platelets: 556 10*3/uL — ABNORMAL HIGH (ref 150–400)
RBC: 4.29 MIL/uL (ref 3.87–5.11)
RDW: 14.5 % (ref 11.5–15.5)
WBC: 6.9 10*3/uL (ref 4.0–10.5)

## 2015-06-14 LAB — COMPREHENSIVE METABOLIC PANEL
ALK PHOS: 92 U/L (ref 38–126)
ALT: 31 U/L (ref 14–54)
AST: 52 U/L — ABNORMAL HIGH (ref 15–41)
Albumin: 4.4 g/dL (ref 3.5–5.0)
Anion gap: 16 — ABNORMAL HIGH (ref 5–15)
BUN: 5 mg/dL — ABNORMAL LOW (ref 6–20)
CHLORIDE: 100 mmol/L — AB (ref 101–111)
CO2: 26 mmol/L (ref 22–32)
CREATININE: 0.71 mg/dL (ref 0.44–1.00)
Calcium: 9.2 mg/dL (ref 8.9–10.3)
GFR calc non Af Amer: >60 mL/min (ref >60)
GLUCOSE: 95 mg/dL (ref 65–99)
POTASSIUM: 3.9 mmol/L (ref 3.5–5.1)
Sodium: 142 mmol/L (ref 135–145)
Total Bilirubin: 0.2 mg/dL — ABNORMAL LOW (ref 0.3–1.2)
Total Protein: 8.3 g/dL — ABNORMAL HIGH (ref 6.5–8.1)

## 2015-06-14 LAB — ETHANOL: Alcohol, Ethyl (B): 394 mg/dL (ref <5)

## 2015-06-14 NOTE — ED Provider Notes (Addendum)
  Physical Exam  BP 119/67 mmHg  Pulse 114  Temp(Src) 97.3 F (36.3 C)  Resp 18  SpO2 95%  LMP 08/31/2013  Physical Exam  ED Course  Procedures  MDM Patient came here for alcohol intoxication. CT head unremarkable. ETOH 394. Now awake and alert. Tolerated food and fluids. Ambulated to the bathroom by herself. Will dc home. Doesn't want detox. Of note, platelet count elevated so will have her f/u outpatient.   Wandra Arthurs, MD 06/14/15 9935  Wandra Arthurs, MD 06/14/15 [phone]

## 2015-06-14 NOTE — Discharge Instructions (Signed)
Stop drinking alcohol.   Your platelet count is high, see your doctor for recheck.   See your doctor.   Return to ER if you have withdrawal, seizures, fall.   Emergency Department Resource Guide 1) Find a Doctor and Pay Out of Pocket Although you won't have to find out who is covered by your insurance plan, it is a good idea to ask around and get recommendations. You will then need to call the office and see if the doctor you have chosen will accept you as a new patient and what types of options they offer for patients who are self-pay. Some doctors offer discounts or will set up payment plans for their patients who do not have insurance, but you will need to ask so you aren't surprised when you get to your appointment.  2) Contact Your Local Health Department Not all health departments have doctors that can see patients for sick visits, but many do, so it is worth a call to see if yours does. If you don't know where your local health department is, you can check in your phone book. The CDC also has a tool to help you locate your state's health department, and many state websites also have listings of all of their local health departments.  3) Find a [facility] If your illness is not likely to be very severe or complicated, you may want to try a walk in clinic. These are popping up all over the country in pharmacies, drugstores, and shopping centers. They're usually staffed by nurse practitioners or physician assistants that have been trained to treat common illnesses and complaints. They're usually fairly quick and inexpensive. However, if you have serious medical issues or chronic medical problems, these are probably not your best option.  No Primary Care Doctor: - Call Health Connect at  [phone] - they can help you locate a primary care doctor that  accepts your insurance, provides certain services, etc. - Physician Referral Service- [phone]  Chronic Pain  Problems: Organization         Address  Phone   Notes  [facility]  [phone] Patients need to be referred by their primary care doctor.   Medication Assistance: Organization         Address  Phone   Notes  [facility] Medication [facility] [address] [place], [place] [address] [phone] --Must be a resident of [facility] -- Must have NO insurance coverage whatsoever (no Medicaid/ Medicare, etc.) -- The pt. MUST have a primary care doctor that directs their care regularly and follows them in the community   Med[facility]ist  [phone]   [employer]  [phone]    Agencies that provide inexpensive medical care: Organization         Address  Phone   Notes  [place]  [phone]   [facility]    [phone]   [facility] [place], [place] [address] [phone]   [place] [address] [place] [phone]   Planned Parenthood    [phone]   [facility]    [phone]   [place] and [address], [place] Phone:  [phone], Fax:  [phone] Hours of Operation:  9 am - 6 pm, M-F.  Also accepts Medicaid/Medicare and self-pay.  [facility]  [address], [place] Phone: [phone], Fax: [phone]. Hours of Operation:  8:30 am - 5:30 pm, M-F.  Also accepts Medicaid and self-pay.  [facility] [address] [place] Phone: [phone]   [place], [place], [place] [phone], Ext. [phone] Mondays & Thursdays: 7-9 AM.  First 15 patients are seen on a first come, first serve basis.    [place] Providers:  Organization         Address  Phone   Notes  [facility] [address], [place] [phone] Also  accepts self-pay patients.  [facility] [address], [place]  [phone]   [place], [address], [place] [phone]   [facility] [address], [place] [phone]   Lucianne Lei [address], [place]   [phone] Only accepts [place] Access [place] patients after they have their name applied to their card.   Self-Pay (no insurance) in [facility]:  Organization         Address  Phone   Notes  Sickle Cell Patients, [facility] [place] [phone]   [facility]gent [facility] [place] [phone]   [facility] [place]  [place], [place],  [phone]   [facility]/Dr. Osei-Bonsu  [address] [place] or [address], [place] [phone] Phone number for both [place] and [place] locations is the same.  Urgent Medical and [facility] [address] [place] [phone]   [facility] [address] [place] or [address] [phone]   [facility] [address] [place] [phone], phone; [phone], fax Sees patients 1st and 3rd Saturday of every month.  Must not qualify for public or private insurance (i.e. Medicaid, Medicare, [place] Health Choice, Veterans' Benefits)  Household income should be no more than 200% of the poverty level The clinic cannot treat you if you are pregnant or think you are pregnant  Sexually transmitted diseases are not treated at the clinic.    Dental Care: Organization         Address  Phone  Notes  [facility] Department of [facility] [phone] Accepts children up to age 72 who are enrolled in [place] or [place]; pregnant  women with a Medicaid card; and children who have applied for Medicaid or [place] Health Choice, but were declined, whose parents can pay a reduced fee at time of service.  [facility] Department of [facility]  [address], [place] [phone] Accepts children up to age 27 who are enrolled in [place] or [place]; pregnant women with a Medicaid card; and children who have applied for Medicaid or [place] Health Choice, but were declined, whose parents can pay a reduced fee at time of service.  [facility]  [place] [phone] Patients are seen by appointment only. Walk-ins are not accepted. [place] will see patients 17 years of age and older. Monday - Tuesday (8am-5pm) Most Wednesdays (8:30-5pm) $30 per visit, cash only  [place]  Access PROGRAM  [address], [facility] [phone] Patients are seen by appointment only. Walk-ins are not accepted. [place] will see patients 37 years of age and older. One Wednesday Evening (Monthly: Volunteer Based).  $30 per visit, cash only  [place]  [phone] for adults; Children under age 77, call Graduate Pediatric Dentistry at [phone]. Children aged 79-14, please call [phone] to request a pediatric application.  Dental services are provided in all areas of dental care including fillings, crowns and bridges, complete and partial dentures, implants, gum treatment, root canals, and extractions. Preventive care is also provided. Treatment is provided to both adults and children. Patients are selected via a lottery and there is often a waiting list.   [facility] [address], [place]  [phone] [email or web address]   [facility] [address] [place], [place] [phone], Ext. [phone] Second and Fourth Thursday of each month, opens at 6:30 AM; Clinic ends at 9 AM.  Patients are  seen on a first-come first-served basis, and a limited number are seen during each clinic.   [facility]  [address] [place] Danker [place], [place] [phone]   Eligibility Requirements You must have lived in [place], [place], or [place] counties for at least the last three months.   You cannot be eligible for state or federal sponsored [employer], including [employer], [place], or [employer].   You generally cannot be eligible for healthcare insurance through your employer.    How to apply: Eligibility screenings are held every Tuesday and Wednesday afternoon from 1:00 pm until 4:00 pm. You do not need an appointment for the interview!  [facility] [address], [place], [place]   [place]  [place] Department  [place]  [phone]    Behavioral Health Resources in the Community: Intensive Outpatient Programs Organization         Address  Phone  Notes  [place]. [address] [place], [place] [phone]   [facility] [address], [place], [place]   ADS: Alcohol & Drug Svcs [address] [place], [place]   [place] [address],  [place], [place] or [phone]   Substance Abuse Resources Organization         Address  Phone  Notes  Alcohol and Drug Services  [phone]   [employer]  [phone]   [facility]   Chinita Pester  [phone]   Residential & Outpatient Substance Abuse Program  [phone]   Psychological Services Organization         Address  Phone  Notes  [facility] [address] [place]  [place]  [phone]   [place] [address] [place] [phone] or [phone]    Mobile Crisis  Teams Organization         Address  Phone  Notes  [facility], Mobile Crisis Care Unit  [phone]   [facility]  [address] [place], [place]   [place] [address], [place] [phone]    Self-Help/Support Groups Organization         Address  Phone             Notes  Mental Health  Joes - variety of support groups  336- H3156881 Call for more information  Narcotics Anonymous (NA), [facility] [address], [employer] [place]  2 meetings at this location   Special educational needs teacher         Address  Phone  Notes  [facility] [place],    [place]  [phone]   [facility]  [address], [place] [phone], [place], [place]   [place], [place] [phone] Admissions: 8am-3pm M-F  Incentives Substance [place] [address],    [place], [place] [phone]   [facility] [address], [place], [place]   [facility] [address],  [place], [place]   [facility] Programs - Intensive Outpatient [address] Kristeen [address], [place], [place]   [facility] [place].,  [place], [place] [phone] or [phone]   [facility] (RTS) [address] [place], [place] Accepts Medicaid  [facility] [address],  [place] [phone] Substance Abuse/Addiction Treatment   [facility] Organization         Address  Phone  Notes  [facility]  [phone]   Domenic Schwab, PhD [address] [place], [place]   [phone] or [phone]   [place], [place] [phone]   [facility] [address], [place], [place] [phone]  Insurance/Medicaid/sponsorship through [facility] and Families [address], [address] [place]                                    [place], [place] [phone] [employer] [address] [place] [address], [place] [phone]    Dr. Adele Schilder  [phone]   [facility] Dept. 1) [address] [place] 2) [place] 3)  [place] [address], [place] [phone]  [phone]   [place] [phone] or [phone] (After Hours)
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
## 2015-06-19 ENCOUNTER — Encounter (HOSPITAL_COMMUNITY): Payer: Self-pay | Admitting: Emergency Medicine

## 2015-06-19 ENCOUNTER — Emergency Department (HOSPITAL_COMMUNITY)
Admission: EM | Admit: 2015-06-19 | Discharge: 2015-06-19 | Disposition: A | Discharge status: Home / Self Care | Payer: BLUE CROSS/BLUE SHIELD | Attending: Emergency Medicine | Admitting: Emergency Medicine

## 2015-06-19 DIAGNOSIS — Z8639 Personal history of other endocrine, nutritional and metabolic disease: Secondary | ICD-10-CM | POA: Insufficient documentation

## 2015-06-19 DIAGNOSIS — Z9884 Bariatric surgery status: Secondary | ICD-10-CM | POA: Insufficient documentation

## 2015-06-19 DIAGNOSIS — F419 Anxiety disorder, unspecified: Secondary | ICD-10-CM | POA: Insufficient documentation

## 2015-06-19 DIAGNOSIS — F329 Major depressive disorder, single episode, unspecified: Secondary | ICD-10-CM | POA: Insufficient documentation

## 2015-06-19 DIAGNOSIS — Z862 Personal history of diseases of the blood and blood-forming organs and certain disorders involving the immune mechanism: Secondary | ICD-10-CM | POA: Insufficient documentation

## 2015-06-19 DIAGNOSIS — F1010 Alcohol abuse, uncomplicated: Secondary | ICD-10-CM | POA: Insufficient documentation
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
## 2015-06-19 NOTE — ED Notes (Signed)
[facility]: [facility] Expected date:  Expected time:  Means of arrival:  Comments: Hold for Mario/EMS

## 2015-06-19 NOTE — Progress Notes (Addendum)
CM present with EMT speaking with charge RN about pt Stating pt had choice of jail or ED when EMT arrived to home.  Please refer to ED RN note.  EMT reports pt behavior was reported to his supervisor and a report written Pt taken to ED lobby by [employer]

## 2015-06-19 NOTE — Discharge Instructions (Signed)
Alcohol Intoxication  Alcohol intoxication occurs when the amount of alcohol that a person has consumed impairs his or her ability to mentally and physically function. Alcohol directly impairs the normal chemical activity of the brain. Drinking large amounts of alcohol can lead to changes in mental function and behavior, and it can cause many physical effects that can be harmful.   Alcohol intoxication can range in severity from mild to very severe. Various factors can affect the level of intoxication that occurs, such as the person's age, gender, weight, frequency of alcohol consumption, and the presence of other medical conditions (such as diabetes, seizures, or heart conditions). Dangerous levels of alcohol intoxication may occur when people drink large amounts of alcohol in a short period (binge drinking). Alcohol can also be especially dangerous when combined with certain prescription medicines or "recreational" drugs.  SIGNS AND SYMPTOMS  Some common signs and symptoms of mild alcohol intoxication include:  · Loss of coordination.  · Changes in mood and behavior.  · Impaired judgment.  · Slurred speech.  As alcohol intoxication progresses to more severe levels, other signs and symptoms will appear. These may include:  · Vomiting.  · Confusion and impaired memory.  · Slowed breathing.  · Seizures.  · Loss of consciousness.  DIAGNOSIS   Your health care provider will take a medical history and perform a physical exam. You will be asked about the amount and type of alcohol you have consumed. Blood tests will be done to measure the concentration of alcohol in your blood. In many places, your blood alcohol level must be lower than 80 mg/dL (0.08%) to legally drive. However, many dangerous effects of alcohol can occur at much lower levels.   TREATMENT   People with alcohol intoxication often do not require treatment. Most of the effects of alcohol intoxication are temporary, and they go away as the alcohol naturally  leaves the body. Your health care provider will monitor your condition until you are stable enough to go home. Fluids are sometimes given through an IV access tube to help prevent dehydration.   HOME CARE INSTRUCTIONS  · Do not drive after drinking alcohol.  · Stay hydrated. Drink enough water and fluids to keep your urine clear or pale yellow. Avoid caffeine.    · Only take over-the-counter or prescription medicines as directed by your health care provider.    SEEK MEDICAL CARE IF:   · You have persistent vomiting.    · You do not feel better after a few days.  · You have frequent alcohol intoxication. Your health care provider can help determine if you should see a substance use treatment counselor.  SEEK IMMEDIATE MEDICAL CARE IF:   · You become shaky or tremble when you try to stop drinking.    · You shake uncontrollably (seizure).    · You throw up (vomit) blood. This may be bright red or may look like black coffee grounds.    · You have blood in your stool. This may be bright red or may appear as a black, tarry, bad smelling stool.    · You become lightheaded or faint.    MAKE SURE YOU:   · Understand these instructions.  · Will watch your condition.  · Will get help right away if you are not doing well or get worse.  Document Released: 09/17/2005 Document Revised: 08/10/2013 Document Reviewed: 05/13/2013  ExitCare® Patient Information ©2015 ExitCare, LLC. This information is not intended to replace advice given to you by your health care provider. Make sure   you discuss any questions you have with your health care provider.
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
## 2015-08-03 ENCOUNTER — Telehealth: Payer: Self-pay

## 2015-08-03 NOTE — Telephone Encounter (Signed)
DDS faxed request for records from 03/22/2014 to present. No OV notes from that time period. Unable to process request.

## 2015-09-14 DIAGNOSIS — Z0271 Encounter for disability determination: Secondary | ICD-10-CM

## 2016-02-07 ENCOUNTER — Encounter (HOSPITAL_COMMUNITY): Payer: Self-pay

## 2016-02-07 ENCOUNTER — Emergency Department (HOSPITAL_COMMUNITY): Payer: MEDICAID

## 2016-02-07 ENCOUNTER — Inpatient Hospital Stay (HOSPITAL_COMMUNITY)
Admission: EM | Admit: 2016-02-07 | Discharge: 2016-02-19 | DRG: 641 | Disposition: A | Discharge status: Home / Self Care | Payer: Self-pay | Attending: Internal Medicine | Admitting: Internal Medicine

## 2016-02-07 ENCOUNTER — Emergency Department (HOSPITAL_COMMUNITY): Payer: Self-pay

## 2016-02-07 DIAGNOSIS — L89329 Pressure ulcer of left buttock, unspecified stage: Secondary | ICD-10-CM | POA: Diagnosis present

## 2016-02-07 DIAGNOSIS — R270 Ataxia, unspecified: Secondary | ICD-10-CM | POA: Diagnosis present

## 2016-02-07 DIAGNOSIS — H5500 Unspecified nystagmus: Secondary | ICD-10-CM | POA: Diagnosis present

## 2016-02-07 DIAGNOSIS — F329 Major depressive disorder, single episode, unspecified: Secondary | ICD-10-CM

## 2016-02-07 DIAGNOSIS — E876 Hypokalemia: Secondary | ICD-10-CM | POA: Diagnosis present

## 2016-02-07 DIAGNOSIS — L8990 Pressure ulcer of unspecified site, unspecified stage: Secondary | ICD-10-CM

## 2016-02-07 DIAGNOSIS — R7401 Elevation of levels of liver transaminase levels: Secondary | ICD-10-CM | POA: Diagnosis present

## 2016-02-07 DIAGNOSIS — Z8249 Family history of ischemic heart disease and other diseases of the circulatory system: Secondary | ICD-10-CM

## 2016-02-07 DIAGNOSIS — Z9884 Bariatric surgery status: Secondary | ICD-10-CM

## 2016-02-07 DIAGNOSIS — E512 Wernicke's encephalopathy: Principal | ICD-10-CM | POA: Diagnosis present

## 2016-02-07 DIAGNOSIS — K769 Liver disease, unspecified: Secondary | ICD-10-CM

## 2016-02-07 DIAGNOSIS — G629 Polyneuropathy, unspecified: Secondary | ICD-10-CM | POA: Diagnosis present

## 2016-02-07 DIAGNOSIS — E1165 Type 2 diabetes mellitus with hyperglycemia: Secondary | ICD-10-CM | POA: Diagnosis present

## 2016-02-07 DIAGNOSIS — R739 Hyperglycemia, unspecified: Secondary | ICD-10-CM | POA: Diagnosis present

## 2016-02-07 DIAGNOSIS — F1020 Alcohol dependence, uncomplicated: Secondary | ICD-10-CM | POA: Diagnosis present

## 2016-02-07 DIAGNOSIS — R17 Unspecified jaundice: Secondary | ICD-10-CM | POA: Diagnosis present

## 2016-02-07 DIAGNOSIS — F332 Major depressive disorder, recurrent severe without psychotic features: Secondary | ICD-10-CM | POA: Diagnosis present

## 2016-02-07 DIAGNOSIS — F32A Depression, unspecified: Secondary | ICD-10-CM | POA: Diagnosis present

## 2016-02-07 DIAGNOSIS — Z811 Family history of alcohol abuse and dependence: Secondary | ICD-10-CM

## 2016-02-07 DIAGNOSIS — K5900 Constipation, unspecified: Secondary | ICD-10-CM | POA: Diagnosis present

## 2016-02-07 DIAGNOSIS — G928 Other toxic encephalopathy: Secondary | ICD-10-CM | POA: Insufficient documentation

## 2016-02-07 DIAGNOSIS — R740 Nonspecific elevation of levels of transaminase and lactic acid dehydrogenase [LDH]: Secondary | ICD-10-CM

## 2016-02-07 DIAGNOSIS — K760 Fatty (change of) liver, not elsewhere classified: Secondary | ICD-10-CM | POA: Diagnosis present

## 2016-02-07 DIAGNOSIS — G9340 Encephalopathy, unspecified: Secondary | ICD-10-CM

## 2016-02-07 DIAGNOSIS — E878 Other disorders of electrolyte and fluid balance, not elsewhere classified: Secondary | ICD-10-CM | POA: Diagnosis present

## 2016-02-07 DIAGNOSIS — L89319 Pressure ulcer of right buttock, unspecified stage: Secondary | ICD-10-CM | POA: Diagnosis present

## 2016-02-07 DIAGNOSIS — E871 Hypo-osmolality and hyponatremia: Secondary | ICD-10-CM | POA: Diagnosis present

## 2016-02-07 DIAGNOSIS — G92 Toxic encephalopathy: Secondary | ICD-10-CM

## 2016-02-07 DIAGNOSIS — E5111 Dry beriberi: Secondary | ICD-10-CM | POA: Diagnosis present

## 2016-02-07 DIAGNOSIS — F419 Anxiety disorder, unspecified: Secondary | ICD-10-CM | POA: Diagnosis present

## 2016-02-07 LAB — ETHANOL: Alcohol, Ethyl (B): <5 mg/dL (ref <5)

## 2016-02-07 LAB — URINALYSIS, ROUTINE W REFLEX MICROSCOPIC
Glucose, UA: NEGATIVE mg/dL
Hgb urine dipstick: NEGATIVE
KETONES UR: NEGATIVE mg/dL
NITRITE: NEGATIVE
PH: 6 (ref 5.0–8.0)
Protein, ur: NEGATIVE mg/dL
Specific Gravity, Urine: 1.029 (ref 1.005–1.030)

## 2016-02-07 LAB — COMPREHENSIVE METABOLIC PANEL
ALT: 121 U/L — ABNORMAL HIGH (ref 14–54)
AST: 209 U/L — ABNORMAL HIGH (ref 15–41)
Albumin: 3.7 g/dL (ref 3.5–5.0)
Alkaline Phosphatase: 236 U/L — ABNORMAL HIGH (ref 38–126)
Anion gap: 9 (ref 5–15)
BUN: 10 mg/dL (ref 6–20)
CO2: 27 mmol/L (ref 22–32)
Calcium: 9.6 mg/dL (ref 8.9–10.3)
Chloride: 92 mmol/L — ABNORMAL LOW (ref 101–111)
Creatinine, Ser: 0.51 mg/dL (ref 0.44–1.00)
GFR calc Af Amer: >60 mL/min (ref >60)
GFR calc non Af Amer: >60 mL/min (ref >60)
Glucose, Bld: 186 mg/dL — ABNORMAL HIGH (ref 65–99)
Potassium: 2.7 mmol/L — CL (ref 3.5–5.1)
Sodium: 128 mmol/L — ABNORMAL LOW (ref 135–145)
Total Bilirubin: 5.1 mg/dL — ABNORMAL HIGH (ref 0.3–1.2)
Total Protein: 7.7 g/dL (ref 6.5–8.1)

## 2016-02-07 LAB — I-STAT TROPONIN, ED: Troponin i, poc: 0 ng/mL (ref 0.00–0.08)

## 2016-02-07 LAB — CBC
HCT: 34.9 % — ABNORMAL LOW (ref 36.0–46.0)
Hemoglobin: 12.4 g/dL (ref 12.0–15.0)
MCH: 34 pg (ref 26.0–34.0)
MCHC: 35.5 g/dL (ref 30.0–36.0)
MCV: 95.6 fL (ref 78.0–100.0)
Platelets: 215 10*3/uL (ref 150–400)
RBC: 3.65 MIL/uL — ABNORMAL LOW (ref 3.87–5.11)
RDW: 15 % (ref 11.5–15.5)
WBC: 7.6 10*3/uL (ref 4.0–10.5)

## 2016-02-07 LAB — MAGNESIUM: Magnesium: 1.6 mg/dL — ABNORMAL LOW (ref 1.7–2.4)

## 2016-02-07 LAB — PROTIME-INR
INR: 1.14 (ref 0.00–1.49)
Prothrombin Time: 14.7 seconds (ref 11.6–15.2)

## 2016-02-07 LAB — URINE MICROSCOPIC-ADD ON: RBC / HPF: NONE SEEN RBC/hpf (ref 0–5)

## 2016-02-07 LAB — CK: Total CK: 92 U/L (ref 38–234)

## 2016-02-07 LAB — LACTIC ACID, PLASMA: Lactic Acid, Venous: 1.1 mmol/L (ref 0.5–2.0)

## 2016-02-07 LAB — APTT: aPTT: 29 seconds (ref 24–37)

## 2016-02-07 LAB — AMMONIA: AMMONIA: 41 umol/L — AB (ref 9–35)

## 2016-02-07 MED ORDER — BISACODYL 5 MG PO TBEC
5.0000 mg | DELAYED_RELEASE_TABLET | Freq: Every day | ORAL | Status: DC | PRN
  Filled 2016-02-07: qty 1

## 2016-02-07 MED ORDER — HYDROCODONE-ACETAMINOPHEN 5-325 MG PO TABS
1.0000 | ORAL_TABLET | ORAL | Status: DC | PRN
  Administered 2016-02-11 – 2016-02-19 (×3): 1 via ORAL
  Filled 2016-02-07: qty 1
  Filled 2016-02-07: qty 2
  Filled 2016-02-07 (×2): qty 1

## 2016-02-07 MED ORDER — IOHEXOL 300 MG/ML  SOLN
100.0000 mL | Freq: Once | INTRAMUSCULAR | Status: AC | PRN
  Administered 2016-02-07: 100 mL via INTRAVENOUS

## 2016-02-07 MED ORDER — SODIUM CHLORIDE 0.9 % IV BOLUS (SEPSIS)
1000.0000 mL | Freq: Once | INTRAVENOUS | Status: AC
  Administered 2016-02-07: 1000 mL via INTRAVENOUS

## 2016-02-07 MED ORDER — LORAZEPAM 1 MG PO TABS: 1.0000 mg | ORAL_TABLET | Freq: Four times a day (QID) | ORAL | Status: AC | PRN

## 2016-02-07 MED ORDER — ENOXAPARIN SODIUM 40 MG/0.4ML ~~LOC~~ SOLN
40.0000 mg | SUBCUTANEOUS | Status: DC
  Administered 2016-02-08 – 2016-02-18 (×10): 40 mg via SUBCUTANEOUS
  Filled 2016-02-07 (×13): qty 0.4

## 2016-02-07 MED ORDER — ACETAMINOPHEN 325 MG PO TABS: 650.0000 mg | ORAL_TABLET | Freq: Four times a day (QID) | ORAL | Status: DC | PRN

## 2016-02-07 MED ORDER — LORAZEPAM 2 MG/ML IJ SOLN
1.0000 mg | Freq: Four times a day (QID) | INTRAMUSCULAR | Status: AC | PRN
  Filled 2016-02-07: qty 1

## 2016-02-07 MED ORDER — ADULT MULTIVITAMIN W/MINERALS CH
1.0000 | ORAL_TABLET | Freq: Every day | ORAL | Status: DC
  Administered 2016-02-08 – 2016-02-18 (×11): 1 via ORAL
  Filled 2016-02-07 (×12): qty 1

## 2016-02-07 MED ORDER — MAGNESIUM SULFATE IN D5W 10-5 MG/ML-% IV SOLN
1.0000 g | Freq: Once | INTRAVENOUS | Status: AC
  Administered 2016-02-07: 1 g via INTRAVENOUS
  Filled 2016-02-07: qty 100

## 2016-02-07 MED ORDER — POTASSIUM CHLORIDE 10 MEQ/100ML IV SOLN
10.0000 meq | Freq: Once | INTRAVENOUS | Status: AC
  Administered 2016-02-07: 10 meq via INTRAVENOUS
  Filled 2016-02-07: qty 100

## 2016-02-07 MED ORDER — THIAMINE HCL 100 MG/ML IJ SOLN
Freq: Once | INTRAVENOUS | Status: AC
  Administered 2016-02-07: 23:00:00 via INTRAVENOUS
  Filled 2016-02-07: qty 1000

## 2016-02-07 MED ORDER — OXYCODONE-ACETAMINOPHEN 5-325 MG PO TABS
1.0000 | ORAL_TABLET | Freq: Once | ORAL | Status: AC
  Administered 2016-02-07: 1 via ORAL
  Filled 2016-02-07: qty 1

## 2016-02-07 MED ORDER — MAGNESIUM SULFATE 50 % IJ SOLN: 1.0000 g | Freq: Once | INTRAMUSCULAR | Status: DC

## 2016-02-07 MED ORDER — POLYETHYLENE GLYCOL 3350 17 G PO PACK
17.0000 g | PACK | Freq: Every day | ORAL | Status: DC | PRN
  Filled 2016-02-07: qty 1

## 2016-02-07 MED ORDER — ONDANSETRON HCL 4 MG PO TABS: 4.0000 mg | ORAL_TABLET | Freq: Four times a day (QID) | ORAL | Status: DC | PRN

## 2016-02-07 MED ORDER — FOLIC ACID 5 MG/ML IJ SOLN
1.0000 mg | Freq: Every day | INTRAMUSCULAR | Status: DC
  Administered 2016-02-08: 1 mg via INTRAVENOUS
  Filled 2016-02-07 (×3): qty 0.2

## 2016-02-07 MED ORDER — THIAMINE HCL 100 MG/ML IJ SOLN
100.0000 mg | Freq: Every day | INTRAMUSCULAR | Status: DC
  Administered 2016-02-08: 100 mg via INTRAVENOUS
  Filled 2016-02-07: qty 1

## 2016-02-07 MED ORDER — ONDANSETRON HCL 4 MG/2ML IJ SOLN
4.0000 mg | Freq: Four times a day (QID) | INTRAMUSCULAR | Status: DC | PRN
  Administered 2016-02-14: 4 mg via INTRAVENOUS
  Filled 2016-02-07: qty 2

## 2016-02-07 MED ORDER — ACETAMINOPHEN 650 MG RE SUPP: 650.0000 mg | Freq: Four times a day (QID) | RECTAL | Status: DC | PRN

## 2016-02-07 NOTE — ED Notes (Signed)
Pt states she lives alone.  Friend comes over to change her.  She doesn't eat.  Husband left her 3 months ago.
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
## 2016-02-07 NOTE — ED Notes (Signed)
Pt to CT
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
## 2016-02-07 NOTE — ED Notes (Signed)
Pt's friend would like to be contacted by social worker ASAP:  Marliss Coots [phone]  Gretta Cool pt's daughter

## 2016-02-07 NOTE — ED Notes (Signed)
Pt reminded of need for urine. Pt refusing in and out cath at this time

## 2016-02-07 NOTE — Progress Notes (Signed)
ED NP/PA evaluating pt when CM went to inquire about pcp

## 2016-02-07 NOTE — ED Notes (Signed)
K level reported to PA
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
## 2016-02-07 NOTE — ED Notes (Signed)
Per EMS, pt from home.  Pt c/o pressure ulcers on buttocks x 1 year.  Pt has had no treatment for it.  Pt has been bed ridden x 6 months d/t neuropathy.  Friend saw wound on Sunday.  Told to come here.  Pt is non compliant w/meds d/t etoh consumption.  Unknown for assistance in home.  Pt is alert and oriented.   HX DM, htn  Vitals: cbg 117, 124/72, hr 80,
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
## 2016-02-07 NOTE — ED Provider Notes (Signed)
Pt presented to the ED for evaluation of progressive weakness, pressure sores and confusion.  Pt lives at home by herself.  According to friends she is confused.  That have to help her with ADLs.   Pt has history of alcohol abuse but denies current consumption. Physical Exam  BP 116/79 mmHg  Pulse 80  Temp(Src) 98.1 F (36.7 C) (Oral)  Resp 16  SpO2 100%  LMP 08/31/2013  Physical Exam  Constitutional: No distress.  HENT:  Head: Normocephalic and atraumatic.  Right Ear: External ear normal.  Left Ear: External ear normal.  Eyes: Conjunctivae are normal. Right eye exhibits no discharge. Left eye exhibits no discharge. No scleral icterus.  Neck: Neck supple. No tracheal deviation present.  Cardiovascular: Normal rate.   Pulmonary/Chest: Effort normal. No stridor. No respiratory distress.  Musculoskeletal: She exhibits no edema.  Neurological: She is alert. Cranial nerve deficit: no gross deficits.  Skin: Skin is warm and dry. No rash noted.  Psychiatric: She has a normal mood and affect.  Nursing note and vitals reviewed.   ED Course  Procedures  MDM Labs show hypokalemia, hyponatremia, elevated LFTs.  Will add on coags, and CT abd to evaluate her new hyperbilirubinemia and elevated lfts.  Pt denies abdominal pain or vomiting.  Doubt cholecystis or acute obstruction.  Care management eval done while in the ED.  Pt is not a candidate for home services.    Will consult with medical service for admission regarding her electrolyte and liver abnormalities for further work up and treatment.      Dorie Rank, MD 02/07/16 [phone]

## 2016-02-08 LAB — CBC WITH DIFFERENTIAL/PLATELET
BASOS ABS: 0 10*3/uL (ref 0.0–0.1)
Basophils Relative: 0 %
Eosinophils Absolute: 0.1 10*3/uL (ref 0.0–0.7)
Eosinophils Relative: 2 %
HEMATOCRIT: 27.6 % — AB (ref 36.0–46.0)
Hemoglobin: 10 g/dL — ABNORMAL LOW (ref 12.0–15.0)
LYMPHS PCT: 21 %
Lymphs Abs: 1.3 10*3/uL (ref 0.7–4.0)
MCH: 34.7 pg — ABNORMAL HIGH (ref 26.0–34.0)
MCHC: 36.2 g/dL — ABNORMAL HIGH (ref 30.0–36.0)
MCV: 95.8 fL (ref 78.0–100.0)
MONO ABS: 1.9 10*3/uL — AB (ref 0.1–1.0)
MONOS PCT: 30 %
NEUTROS ABS: 2.8 10*3/uL (ref 1.7–7.7)
Neutrophils Relative %: 47 %
Platelets: 231 10*3/uL (ref 150–400)
RBC: 2.88 MIL/uL — ABNORMAL LOW (ref 3.87–5.11)
RDW: 15.3 % (ref 11.5–15.5)
WBC: 6.1 10*3/uL (ref 4.0–10.5)

## 2016-02-08 LAB — COMPREHENSIVE METABOLIC PANEL
ALBUMIN: 3 g/dL — AB (ref 3.5–5.0)
ALK PHOS: 175 U/L — AB (ref 38–126)
ALT: 89 U/L — AB (ref 14–54)
ANION GAP: 7 (ref 5–15)
AST: 131 U/L — AB (ref 15–41)
BILIRUBIN TOTAL: 3.6 mg/dL — AB (ref 0.3–1.2)
BUN: 6 mg/dL (ref 6–20)
CALCIUM: 9 mg/dL (ref 8.9–10.3)
CO2: 26 mmol/L (ref 22–32)
Chloride: 101 mmol/L (ref 101–111)
Creatinine, Ser: <0.3 mg/dL — ABNORMAL LOW (ref 0.44–1.00)
Glucose, Bld: 93 mg/dL (ref 65–99)
Potassium: 2.9 mmol/L — ABNORMAL LOW (ref 3.5–5.1)
SODIUM: 134 mmol/L — AB (ref 135–145)
TOTAL PROTEIN: 6.3 g/dL — AB (ref 6.5–8.1)

## 2016-02-08 LAB — VITAMIN B12: Vitamin B-12: 574 pg/mL (ref 180–914)

## 2016-02-08 LAB — RAPID URINE DRUG SCREEN, HOSP PERFORMED
Amphetamines: NOT DETECTED
Barbiturates: NOT DETECTED
Benzodiazepines: NOT DETECTED
Cocaine: NOT DETECTED
OPIATES: NOT DETECTED
Tetrahydrocannabinol: NOT DETECTED

## 2016-02-08 LAB — BASIC METABOLIC PANEL
ANION GAP: 11 (ref 5–15)
BUN: 7 mg/dL (ref 6–20)
CALCIUM: 9.1 mg/dL (ref 8.9–10.3)
CO2: 22 mmol/L (ref 22–32)
Chloride: 97 mmol/L — ABNORMAL LOW (ref 101–111)
Creatinine, Ser: 0.46 mg/dL (ref 0.44–1.00)
Glucose, Bld: 121 mg/dL — ABNORMAL HIGH (ref 65–99)
Potassium: 2.7 mmol/L — CL (ref 3.5–5.1)
Sodium: 130 mmol/L — ABNORMAL LOW (ref 135–145)

## 2016-02-08 LAB — HEPATITIS PANEL, ACUTE
HCV Ab: <0.1 s/co ratio (ref 0.0–0.9)
Hep A IgM: NEGATIVE
Hep B C IgM: NEGATIVE
Hepatitis B Surface Ag: NEGATIVE

## 2016-02-08 LAB — PROCALCITONIN: Procalcitonin: 0.26 ng/mL

## 2016-02-08 LAB — TSH: TSH: 1.937 u[IU]/mL (ref 0.350–4.500)

## 2016-02-08 LAB — MAGNESIUM: Magnesium: 2 mg/dL (ref 1.7–2.4)

## 2016-02-08 MED ORDER — POTASSIUM CHLORIDE CRYS ER 20 MEQ PO TBCR
40.0000 meq | EXTENDED_RELEASE_TABLET | Freq: Once | ORAL | Status: AC
  Administered 2016-02-08: 40 meq via ORAL
  Filled 2016-02-08: qty 2

## 2016-02-08 MED ORDER — LIP MEDEX EX OINT
TOPICAL_OINTMENT | CUTANEOUS | Status: AC
Start: 2016-02-08 — End: 2016-02-08
  Administered 2016-02-08: 11:00:00
  Filled 2016-02-08: qty 7

## 2016-02-08 MED ORDER — LIP MEDEX EX OINT
TOPICAL_OINTMENT | CUTANEOUS | Status: AC
  Administered 2016-02-08: 11:00:00
  Filled 2016-02-08: qty 7

## 2016-02-08 MED ORDER — LACTULOSE 10 GM/15ML PO SOLN
20.0000 g | Freq: Two times a day (BID) | ORAL | Status: DC
  Administered 2016-02-08 – 2016-02-18 (×21): 20 g via ORAL
  Filled 2016-02-08 (×26): qty 30

## 2016-02-08 MED ORDER — VITAMIN B-1 100 MG PO TABS
100.0000 mg | ORAL_TABLET | Freq: Every day | ORAL | Status: DC
  Administered 2016-02-09 – 2016-02-10 (×2): 100 mg via ORAL
  Filled 2016-02-08 (×2): qty 1

## 2016-02-08 MED ORDER — THIAMINE HCL 100 MG/ML IJ SOLN
100.0000 mg | Freq: Every day | INTRAMUSCULAR | Status: DC
  Filled 2016-02-08 (×2): qty 1

## 2016-02-08 MED ORDER — POTASSIUM PHOSPHATES 15 MMOLE/5ML IV SOLN
40.0000 meq | Freq: Once | INTRAVENOUS | Status: AC
  Administered 2016-02-08: 40 meq via INTRAVENOUS
  Filled 2016-02-08: qty 9.09

## 2016-02-08 NOTE — Consult Note (Signed)
WOC wound consult note Reason for Consult: full thickness wounds on bilateral buttocks and in the intragluteal space. The largest of these three is the one on the left buttocks.  It has been mentioned that because of her neuropathy, patient sometimes sits on the floor and "scoots" her way around her home. Injury presentation is consistent with this type of trauma. Wound type: Friction, moisture, pressure Pressure Ulcer POA: Yes Measurement: left buttock:  4cm x 5cm x 0.2cm, Red, Moist with small amount serous exudate.   intragluteal:  3cm x 0.4cm with thin yellow slough obscuring wound bed.  Right buttocks:  3cm x 4cm x 0.2cm, red, moist with small amount of serous exudate. Wound bed:As described above. Drainage (amount, consistency, odor) As described above. Periwound:Intact. Dressing procedure/placement/frequency: I will be providing the patient with a mattress replacement today with low air loss feature.  She was instructed on turning side to side and avoiding the supine position.  I will also provide a pressure redistribution chair cushion for her use when OOB in chair.  Topical dressing will be a soft silicone foam dressing for the sacrum and positioned so that it covers both buttocks and the intergluteal space. [place] nursing team will not follow, but will remain available to this patient, the nursing and medical teams.  Please re-consult if needed. Thanks, Maudie Flakes, MSN, RN, [place], Arther Abbott  Pager# [phone]

## 2016-02-08 NOTE — Progress Notes (Signed)
[facility] Progress Note  Patient: Natalie Mcdaniel [id number]   PCP: JEFFERY,CHELLE, PA-C DOB: 1966/02/08   DOA: 02/07/2016   DOS: 02/08/2016   Date of Service: the patient was seen and examined on 02/08/2016  Subjective: Patient does not have any acute complaint. No nausea no vomiting and abdominal pain. She is pleasantly confused Nutrition: Tolerating oral diet Activity: Ambulating in the room Last BM: 02/08/2016  Assessment and Plan: 1. Toxic metabolic encephalopathy Patient was brought in by friend was concerned that the patient was not able to take care of herself. Patient lives alone. Patient has been that she is not aware of any liver disease. Patient does not have any asterixis, ammonia level was minimally elevated on admission. Given this I suspect that patient's confusion as well as delirium is less likely secondary to hepatic encephalopathy. We'll continue with lactulose for constipation. We'll get further workup for secondary causes of cognitive imbalance. Psychiatry has also been consulted to assist and capacity evaluation.  2. Alcohol abuse. Currently on CIWA protocol no evidence of withdrawal at present.  3.  electrolyte imbalance. Improving rapidly with IV hydration and correction. We'll continue close monitoring. Prior history of gastric bypass with significant weight loss. We will need to monitor for refeeding syndrome as patient is likely not getting adequate nutrition at home.  4. pressure ulcers. Patient has significant pressure ulcers on the buttocks does not appear to be actively infected. Wound care consult has been appreciated. We will need to monitor closely.  DVT Prophylaxis: subcutaneous Heparin Nutrition: regular diet Advance goals of care discussion: full code  Brief Summary of Hospitalization:  HPI: As per the H and P dictated on admission, "Natalie Mcdaniel is a 50 y.o. female with PMH of morbid obesity status post Roux-en-Y bypass,  chronic alcoholism with no known history of DT or seizures, depression, and anxiety who presents from home via EMS after she was found on the floor by a friend who was concern for the patient's inability to adequately care for herself. Patient's friend is at the bedside and assists with the history. Patient has a long history of chronic alcohol abuse, citing this as the reason she has not followed with her PCP or adhered to her treatment plan. She has been largely confined to her bed for at least 6 months, explaining that her peripheral neuropathy precludes her ability to ambulate. Over this interval, she has developed pressure sores over her buttock. Her friend at the bedside reportedly checks on her several times a week, finding the patient's house to be in disarray with trash throughout and foul odor. Patient's friend reportedly brings meals when she visits, but the patient otherwise does not eat. She had previously obtained a bachelor's degree and worked for [employer] before her alcohol dependence made this impossible. She notes that since she has lost her job, she has not had money to buy alcohol, and states that unless a friend brings her son, she has had to go without. Her friend agrees that she has been drinking much less than previously when she would consume approximately 24 12-ounce beers daily.  In ED, patient was found to be afebrile, saturating well on room air, and with vital signs stable. Chest x-ray was negative for acute cardiopulmonary disease and noncontrast head CT was negative for acute intracranial abnormality. EKG revealed a sinus rhythm with nonspecific ST changes in the inferior leads. Basic lab work was obtained and CMP returns with sodium of 128, potassium 2.7, moderate elevations in transaminases,  and a bilirubin of 5.1. CBC is unremarkable. Ethanol and troponin levels are undetectable, and ammonia returned slightly elevated to 41. Given the elevated transaminases and bilirubin, a  contrast-enhanced CT of the abdomen was obtained and demonstrative of mild hepatic steatosis. Patient was bolused with 1 L normal saline in the emergency department, given 1 g of magnesium, 10 mEq IV potassium, and a single dose of Norco 5 mg. She remained hemodynamically stable in the emergency department and will be admitted for ongoing evaluation and management of encephalopathy with widespread electrolyte derangements." Daily update, Procedures: none Consultants: Psychiatric  Antibiotics: Anti-infectives    None      Family Communication: no family was present at bedside, at the time of interview.   Disposition:  Expected discharge date: 02/11/2016  Barriers to safe discharge: improvement in electrolyte status and mentation    Intake/Output Summary (Last 24 hours) at 02/08/16 1900 Last data filed at 02/08/16 1141  Gross per 24 hour  Intake      0 ml  Output    300 ml  Net   -300 ml   Filed Weights   02/08/16 0311  Weight: 65.7 kg (144 lb 13.5 oz)    Objective: Physical Exam: Filed Vitals:   02/08/16 0030 02/08/16 0311 02/08/16 0507 02/08/16 1349  BP: 105/76 101/60 106/60 100/60  Pulse: 86 87 78 98  Temp:  98.8 F (37.1 C) 99.1 F (37.3 C) 98.3 F (36.8 C)  TempSrc:  Oral Oral Oral  Resp: 18 18 18 18   Height:  5' 6.5" (1.689 m)    Weight:  65.7 kg (144 lb 13.5 oz)    SpO2: 100% 100% 100% 100%     General: Appear in mild distress, no Rash; Oral Mucosa moist. Cardiovascular: S1 and S2 Present, no Murmur, no JVD Respiratory: Bilateral Air entry present and Clear to Auscultation, no Crackles, no wheezes Abdomen: Bowel Sound present, Soft and no tenderness Extremities: no Pedal edema, no calf tenderness Neurology: Grossly no focal neuro deficit.  Data Reviewed: CBC:  Recent Labs Lab 02/07/16 1646 02/08/16 0449  WBC 7.6 6.1  NEUTROABS  --  2.8  HGB 12.4 10.0*  HCT 34.9* 27.6*  MCV 95.6 95.8  PLT 215 [id number]   Basic Metabolic Panel:  Recent Labs Lab  02/07/16 1646 02/07/16 1800 02/07/16 2232 02/08/16 0433  NA 128*  --  130* 134*  K 2.7*  --  2.7* 2.9*  CL 92*  --  97* 101  CO2 27  --  22 26  GLUCOSE 186*  --  121* 93  BUN 10  --  7 6  CREATININE 0.51  --  0.46 <0.30*  CALCIUM 9.6  --  9.1 9.0  MG  --  1.6*  --  2.0   Liver Function Tests:  Recent Labs Lab 02/07/16 1646 02/08/16 0433  AST 209* 131*  ALT 121* 89*  ALKPHOS 236* 175*  BILITOT 5.1* 3.6*  PROT 7.7 6.3*  ALBUMIN 3.7 3.0*   No results for input(s): LIPASE, AMYLASE in the last 168 hours.  Recent Labs Lab 02/07/16 1828  AMMONIA 41*    Cardiac Enzymes:  Recent Labs Lab 02/07/16 1646  CKTOTAL 92    BNP (last 3 results) No results for input(s): BNP in the last 8760 hours.  CBG: No results for input(s): GLUCAP in the last 168 hours.  No results found for this or any previous visit (from the past 240 hour(s)).   Studies: Ct Abdomen Pelvis W Contrast  02/07/2016  CLINICAL DATA:  50 year old female with elevated LFTs and pressure ulcers on the buttock. EXAM: CT ABDOMEN AND PELVIS WITH CONTRAST TECHNIQUE: Multidetector CT imaging of the abdomen and pelvis was performed using the standard protocol following bolus administration of intravenous contrast. CONTRAST:  173mL OMNIPAQUE IOHEXOL 300 MG/ML  SOLN COMPARISON:  None. FINDINGS: The visualized lung bases are clear. No intra-abdominal free air or free fluid. Probable mild fatty infiltration of the liver. The gallbladder, pancreas, spleen, adrenal glands, kidneys, visualized ureters, and urinary bladder appear unremarkable. The uterus and ovaries are grossly unremarkable. There postsurgical changes of gastric bypass. There is no evidence of bowel obstruction or inflammation. Normal appendix. The abdominal aorta and IVC appears patent. No portal venous gas identified. There is no adenopathy. There is a midline vertical anterior pelvic wall incisional scar. Small fat containing umbilical hernia. There is mild  haziness of the herniated fat as well as mild haziness of the omentum. Clinical correlation is recommended to exclude strangulation/incarceration. No fluid collection identified. There is mild diffuse subcutaneous soft tissue stranding. It is a small calcific density noted in the subcutaneous soft tissues of the right gluteal region likely is a granuloma and sequela of prior insult. There is mild degenerative changes of the spine. No acute fracture. IMPRESSION: No acute intra-abdominal pelvic pathology. Mild fatty liver. Small fat containing umbilical hernia. Electronically Signed   By: Anner Crete M.D.   On: 02/07/2016 19:56     Scheduled Meds: . enoxaparin (LOVENOX) injection  40 mg Subcutaneous Q24H  . folic acid  1 mg Intravenous Daily  . lactulose  20 g Oral BID  . multivitamin with minerals  1 tablet Oral Daily  . [START ON 02/09/2016] thiamine  100 mg Oral Daily   Or  . [START ON 02/09/2016] thiamine IV  100 mg Intravenous Daily   Continuous Infusions:  PRN Meds: acetaminophen **OR** acetaminophen, bisacodyl, HYDROcodone-acetaminophen, LORazepam **OR** LORazepam, ondansetron **OR** ondansetron (ZOFRAN) IV, polyethylene glycol  Time spent: 30 minutes  Author: Berle Mull, MD [facility] Pager: [phone] 02/08/2016 7:00 PM  If 7PM-7AM, please contact night-coverage at [email or web address], password [facility]

## 2016-02-08 NOTE — Progress Notes (Signed)
Patient arrived on unit via stretcher with ed nurse.  Patient is Alert but is very forgetful and confused at times.  She does appear to answer some question appropriately but then forgets and asks them again and gain.  She is at this asking about her hand bag with her IDs in them.  Nurse call ED to find out if it was there, No one had seen it.  Patient was not transported to [address] [place] with hand bag, Just clothing.  This was told to patient.  Roland Rack, RN.  [phone], 02/08/2016.

## 2016-02-08 NOTE — Evaluation (Signed)
Physical Therapy Evaluation Patient Details Name: Natalie Mcdaniel MRN: [id number] DOB: 08/31/1966 Today's Date: 02/08/2016   History of Present Illness  50 y.o. female with h/o EtOH abuse, neuropathy, depression admitted due to being found on floor by a friend, home was malodorous, pt confused. Per phone call with her friend, Marliss Coots, pt's home is in foreclosure and pt hasn't ambulated for several months, she crawls on the floor.  Dx of hyponatremia.     Clinical Impression  Pt admitted with above diagnosis. Pt currently with functional limitations due to the deficits listed below (see PT Problem List). Min to mod assist for bed mobility and pivot transfer to recliner. Pt verbally refuses SNF recommendation.  Pt will benefit from skilled PT to increase their independence and safety with mobility to allow discharge to the venue listed below.       Follow Up Recommendations SNF;Supervision/Assistance - 24 hour    Equipment Recommendations  None recommended by PT    Recommendations for Other Services OT consult     Precautions / Restrictions Precautions Precautions: Fall Precaution Comments: scabs on B knees, pt reports she has had falls but denies crawling on floor      Mobility  Bed Mobility Overal bed mobility: Needs Assistance Bed Mobility: Supine to Sit     Supine to sit: Min assist     General bed mobility comments: min A to raise trunk and to scoot to EOB  Transfers Overall transfer level: Needs assistance   Transfers: Sit to/from Stand;Stand Pivot Transfers Sit to Stand: Mod assist;From elevated surface Stand pivot transfers: Min assist       General transfer comment: mod A to rise, verbal cues hand placement, min A to manage RW for SPT; wide BOS, shuffling pivotal steps to recliner  Ambulation/Gait                Stairs            Wheelchair Mobility    Modified Rankin (Stroke Patients Only)       Balance Overall balance assessment:  Needs assistance Sitting-balance support: Feet supported;Single extremity supported Sitting balance-Leahy Scale: Fair     Standing balance support: Bilateral upper extremity supported Standing balance-Leahy Scale: Poor Standing balance comment: relies on BUE support                             Pertinent Vitals/Pain Pain Assessment: Faces Faces Pain Scale: Hurts little more Pain Location: R ankle Pain Descriptors / Indicators: Sore Pain Intervention(s): Limited activity within patient's tolerance;Monitored during session;Repositioned    Home Living Family/patient expects to be discharged to:: Private residence Living Arrangements: Alone   Type of Home: House Home Access: Stairs to enter Entrance Stairs-Rails: Right;Left;Can reach both Entrance Stairs-Number of Steps: 6 Home Layout: Two level;Able to live on main level with bedroom/bathroom Home Equipment: Gilford Rile - 2 wheels;Shower seat;Wheelchair - manual Additional Comments: friends stop by to check on pt    Prior Function Level of Independence: Needs assistance   Gait / Transfers Assistance Needed: per chart and phone conversation with pt's friend Marliss Coots, pt hasn't walked in 3-4 months, she is mostly bed bound and crawls on the floor  ADL's / Homemaking Assistance Needed: per chart, pt was found on floor soiled        Hand Dominance        Extremity/Trunk Assessment   Upper Extremity Assessment: Overall WFL for tasks assessed  Lower Extremity Assessment: Generalized weakness (knee extension -4/5, ankle DF -4/5; R ankle swollen laterally, scabs B knees)      Cervical / Trunk Assessment: Normal  Communication   Communication: No difficulties  Cognition Arousal/Alertness: Awake/alert Behavior During Therapy: WFL for tasks assessed/performed Overall Cognitive Status: Impaired/Different from baseline Area of Impairment: Memory;Safety/judgement;Awareness;Problem solving     Memory:  Decreased short-term memory   Safety/Judgement: Decreased awareness of deficits;Decreased awareness of safety     General Comments: pt repeats questions frequently, unreliable historian    General Comments      Exercises        Assessment/Plan    PT Assessment Patient needs continued PT services  PT Diagnosis Difficulty walking;Generalized weakness;Altered mental status   PT Problem List Decreased strength;Decreased activity tolerance;Decreased balance;Pain;Decreased cognition;Decreased mobility;Decreased safety awareness;Decreased skin integrity  PT Treatment Interventions DME instruction;Gait training;Functional mobility training;Therapeutic activities;Patient/family education;Balance training;Therapeutic exercise   PT Goals (Current goals can be found in the Care Plan section) Acute Rehab PT Goals Patient Stated Goal: to go home and be with her cats PT Goal Formulation: With patient Time For Goal Achievement: 02/22/16 Potential to Achieve Goals: Fair    Frequency Min 3X/week   Barriers to discharge        Co-evaluation               End of Session Equipment Utilized During Treatment: Gait belt Activity Tolerance: Patient tolerated treatment well;No increased pain Patient left: in chair;with call bell/phone within reach;with chair alarm set Nurse Communication: Mobility status         Time: [id number] PT Time Calculation (min) (ACUTE ONLY): 25 min   Charges:   PT Evaluation $PT Eval Moderate Complexity: 1 Procedure PT Treatments $Therapeutic Activity: 8-22 mins   PT G Codes:        Philomena Doheny 02/08/2016, 11:04 AM [phone]

## 2016-02-08 NOTE — Progress Notes (Signed)
Initial Nutrition Assessment  DOCUMENTATION CODES:   Non-severe (moderate) malnutrition in context of chronic illness  INTERVENTION:  -Ensure Enlive po BID, each supplement provides 350 kcal and 20 grams of protein  NUTRITION DIAGNOSIS:   Malnutrition related to chronic illness as evidenced by moderate depletion of body fat, moderate depletions of muscle mass.  GOAL:   Patient will meet greater than or equal to 90% of their needs  MONITOR:   PO intake, I & O's, Labs, Supplement acceptance, Skin  REASON FOR ASSESSMENT:   Consult Assessment of nutrition requirement/status  ASSESSMENT:   Natalie Mcdaniel is a 50 y.o. female with PMH of morbid obesity status post Roux-en-Y bypass, chronic alcoholism with no known history of DT or seizures, depression, and anxiety who presents from home via EMS after she was found on the floor by a friend who was concern for the patient's inability to adequately care for herself  Spoke with pt at bedside. Pt endorses no weight loss, good appetite, eating ~2 meals/day. She had just eaten shortly before I arrived, tray showed 100% completion. Pt is s/p Roux-En-Y  In 2003 where she was >300#.  She stated that she began working out and actually gained muscle. Per MD note, she used to drink 24 beers per day. She has been drinking less recently because of financial issues. When I asked her about drinking and eating, she said she wouldn't eat when she drank to avoid the extra calories. She denies any nausea / vomiting or pain with her stomach.  Nutrition-Focused physical exam completed. Findings are moderate fat depletion, moderate muscle depletion, and no edema.   Labs: Na 134, K 2.9, Cr <0.3, LFT increased Medications: Banana Bag, Zofran PRN  Diet Order:  Diet regular Room service appropriate?: Yes; Fluid consistency:: Thin  Skin:  Wound (see comment) (Bilateral pressure ulcers to buttocks.)  Last BM:  2/17  Height:   Ht Readings from Last 1  Encounters:  02/08/16 5' 6.5" (1.689 m)    Weight:   Wt Readings from Last 1 Encounters:  02/08/16 144 lb 13.5 oz (65.7 kg)    Ideal Body Weight:  59.09 kg  BMI:  Body mass index is 23.03 kg/(m^2).  Estimated Nutritional Needs:   Kcal:  1600-1900 calories  Protein:  65-80 grams  Fluid:  >/= 1.6L  EDUCATION NEEDS:   No education needs identified at this time  Satira Anis. Kahleb Mcclane, MS, RD LDN After Hours/Weekend Pager [phone]

## 2016-02-08 NOTE — ED Notes (Signed)
Hospitalist notified of K

## 2016-02-08 NOTE — ED Notes (Signed)
Erasmo Downer, RN notified of critical K+ of 2.7

## 2016-02-08 NOTE — Care Management Note (Signed)
Case Management Note  Patient Details  Name: CYLINDA NEILSEN MRN: [id number] Date of Birth: 1966/07/15  Subjective/Objective:    Admitted with hepatic encephalopathy, electrolyte imbalance                Action/Plan: Spoke with patient at bedside, patient changes story often and is unable to remember details. Per patient, she is able to ambulate with a walker, still drives self, husband is estranged but still assists her with maintaining her home, patient has PCP she saw several months back. Patient states she spends most of her time in bed because that is where her TV is, when asked why she is not up more she asked "what do you want me to do, just stand around" Discussed skin break down and how she got that, she does not know. Patient claims to have Medicare coverage but unsure where her purse was, she said she had it when she came to the hospital, she at first thought she came by car. I told he she came by EMS she does not remember this. Contacted her friend, Marliss Coots, with her permission. Friend states patient has not ambulated in months, her house is in foreclosure. Patient does not have any recall of these issues. Will likely need SNF placement. Discussed with CSW and attending, plan for capacity evaluation.   Expected Discharge Date:   (unknown)               Expected Discharge Plan:  Skilled Nursing Facility  In-House Referral:  Clinical Social Work  Discharge planning Services  CM Consult  Post Acute Care Choice:  NA Choice offered to:  NA  DME Arranged:  N/A DME Agency:  NA  HH Arranged:  NA HH Agency:  NA  Status of Service:  Completed, signed off  Medicare Important Message Given:    Date Medicare IM Given:    Medicare IM give by:    Date Additional Medicare IM Given:    Additional Medicare Important Message give by:     If discussed at [place] of Stay Meetings, dates discussed:    Additional Comments:  Guadalupe Maple, RN 02/08/2016, 11:03 AM [phone]

## 2016-02-09 DIAGNOSIS — F1024 Alcohol dependence with alcohol-induced mood disorder: Secondary | ICD-10-CM

## 2016-02-09 DIAGNOSIS — F332 Major depressive disorder, recurrent severe without psychotic features: Secondary | ICD-10-CM

## 2016-02-09 DIAGNOSIS — G9340 Encephalopathy, unspecified: Secondary | ICD-10-CM

## 2016-02-09 DIAGNOSIS — G629 Polyneuropathy, unspecified: Secondary | ICD-10-CM

## 2016-02-09 LAB — COMPREHENSIVE METABOLIC PANEL
ALT: 78 U/L — AB (ref 14–54)
ANION GAP: 8 (ref 5–15)
AST: 91 U/L — ABNORMAL HIGH (ref 15–41)
Albumin: 3 g/dL — ABNORMAL LOW (ref 3.5–5.0)
Alkaline Phosphatase: 154 U/L — ABNORMAL HIGH (ref 38–126)
BUN: <5 mg/dL — ABNORMAL LOW (ref 6–20)
CALCIUM: 9 mg/dL (ref 8.9–10.3)
CHLORIDE: 103 mmol/L (ref 101–111)
CO2: 24 mmol/L (ref 22–32)
CREATININE: 0.43 mg/dL — AB (ref 0.44–1.00)
Glucose, Bld: 110 mg/dL — ABNORMAL HIGH (ref 65–99)
POTASSIUM: 3.4 mmol/L — AB (ref 3.5–5.1)
Sodium: 135 mmol/L (ref 135–145)
TOTAL PROTEIN: 6.2 g/dL — AB (ref 6.5–8.1)
Total Bilirubin: 2.5 mg/dL — ABNORMAL HIGH (ref 0.3–1.2)

## 2016-02-09 LAB — HEMOGLOBIN A1C
Hgb A1c MFr Bld: 5.2 % (ref 4.8–5.6)
Mean Plasma Glucose: 103 mg/dL

## 2016-02-09 LAB — URINE CULTURE

## 2016-02-09 LAB — HIV ANTIBODY (ROUTINE TESTING W REFLEX): HIV SCREEN 4TH GENERATION: NONREACTIVE

## 2016-02-09 LAB — GLUCOSE, CAPILLARY: Glucose-Capillary: 92 mg/dL (ref 65–99)

## 2016-02-09 LAB — MAGNESIUM: Magnesium: 1.5 mg/dL — ABNORMAL LOW (ref 1.7–2.4)

## 2016-02-09 LAB — PROCALCITONIN: PROCALCITONIN: 0.13 ng/mL

## 2016-02-09 LAB — RPR: RPR: NONREACTIVE

## 2016-02-09 MED ORDER — FOLIC ACID 1 MG PO TABS
1.0000 mg | ORAL_TABLET | Freq: Every day | ORAL | Status: DC
  Administered 2016-02-09 – 2016-02-18 (×10): 1 mg via ORAL
  Filled 2016-02-09 (×11): qty 1

## 2016-02-09 MED ORDER — ESCITALOPRAM OXALATE 10 MG PO TABS
10.0000 mg | ORAL_TABLET | Freq: Every day | ORAL | Status: DC
  Administered 2016-02-09 – 2016-02-18 (×10): 10 mg via ORAL
  Filled 2016-02-09 (×11): qty 1

## 2016-02-09 MED ORDER — MAGNESIUM SULFATE 2 GM/50ML IV SOLN
2.0000 g | Freq: Once | INTRAVENOUS | Status: AC
  Administered 2016-02-09: 2 g via INTRAVENOUS
  Filled 2016-02-09: qty 50

## 2016-02-09 MED ORDER — MIRTAZAPINE 7.5 MG PO TABS
7.5000 mg | ORAL_TABLET | Freq: Every day | ORAL | Status: DC
  Administered 2016-02-10 – 2016-02-18 (×8): 7.5 mg via ORAL
  Filled 2016-02-09 (×11): qty 1

## 2016-02-09 NOTE — Progress Notes (Signed)
[facility] Progress Note  Patient: Natalie Mcdaniel [id number]   PCP: JEFFERY,CHELLE, PA-C DOB: 1966-07-30   DOA: 02/07/2016   DOS: 02/09/2016   Date of Service: the patient was seen and examined on 02/09/2016  Subjective: Patient continues to remain pleasantly confused. Denies any acute complaint of nausea no vomiting or diarrhea. Nutrition: Tolerating oral diet Activity: Ambulating in the room Last BM: 02/08/2016  Assessment and Plan: 1. Toxic metabolic encephalopathy Patient was brought in by friend was concerned that the patient was not able to take care of herself. Patient lives alone. Patient has been that she is not aware of any liver disease. Patient does not have any asterixis, ammonia level was minimally elevated on admission. Given this I suspect that patient's confusion as well as delirium is less likely secondary to hepatic encephalopathy. We'll continue with lactulose for constipation. MRI brain for further workup regarding acute encephalopathy. Psychiatry has recommended that the patient has capacity to make medical decision. Appreciate input.  2. Alcohol abuse. Currently on CIWA protocol no evidence of withdrawal at present.  3.  electrolyte imbalance. Improving rapidly with IV hydration and correction. We'll continue close monitoring. Prior history of gastric bypass with significant weight loss. We will need to monitor for refeeding syndrome as patient is likely not getting adequate nutrition at home.  4. pressure ulcers. Patient has significant pressure ulcers on the buttocks does not appear to be actively infected. Wound care consult has been appreciated. We will need to monitor closely.  DVT Prophylaxis: subcutaneous Heparin Nutrition: regular diet Advance goals of care discussion: full code  Brief Summary of Hospitalization:  HPI: As per the H and P dictated on admission, "[name] is a 50 y.o. female with PMH of morbid obesity status post  Roux-en-Y bypass, chronic alcoholism with no known history of DT or seizures, depression, and anxiety who presents from home via EMS after she was found on the floor by a friend who was concern for the patient's inability to adequately care for herself. Patient's friend is at the bedside and assists with the history. Patient has a long history of chronic alcohol abuse, citing this as the reason she has not followed with her PCP or adhered to her treatment plan. She has been largely confined to her bed for at least 6 months, explaining that her peripheral neuropathy precludes her ability to ambulate. Over this interval, she has developed pressure sores over her buttock. Her friend at the bedside reportedly checks on her several times a week, finding the patient's house to be in disarray with trash throughout and foul odor. Patient's friend reportedly brings meals when she visits, but the patient otherwise does not eat. She had previously obtained a bachelor's degree and worked for [employer] before her alcohol dependence made this impossible. She notes that since she has lost her job, she has not had money to buy alcohol, and states that unless a friend brings her son, she has had to go without. Her friend agrees that she has been drinking much less than previously when she would consume approximately 24 12-ounce beers daily.  In ED, patient was found to be afebrile, saturating well on room air, and with vital signs stable. Chest x-ray was negative for acute cardiopulmonary disease and noncontrast head CT was negative for acute intracranial abnormality. EKG revealed a sinus rhythm with nonspecific ST changes in the inferior leads. Basic lab work was obtained and CMP returns with sodium of 128, potassium 2.7, moderate elevations in transaminases,  and a bilirubin of 5.1. CBC is unremarkable. Ethanol and troponin levels are undetectable, and ammonia returned slightly elevated to 41. Given the elevated transaminases and  bilirubin, a contrast-enhanced CT of the abdomen was obtained and demonstrative of mild hepatic steatosis. Patient was bolused with 1 L normal saline in the emergency department, given 1 g of magnesium, 10 mEq IV potassium, and a single dose of Norco 5 mg. She remained hemodynamically stable in the emergency department and will be admitted for ongoing evaluation and management of encephalopathy with widespread electrolyte derangements." Daily update, Procedures: none Consultants: Psychiatric  Antibiotics: Anti-infectives    None      Family Communication: no family was present at bedside, at the time of interview.   Disposition:  Expected discharge date: 02/11/2016  Barriers to safe discharge: improvement in electrolyte status and mentation    Intake/Output Summary (Last 24 hours) at 02/09/16 1516 Last data filed at 02/09/16 1400  Gross per 24 hour  Intake    600 ml  Output    400 ml  Net    200 ml   Filed Weights   02/08/16 0311 02/09/16 0707  Weight: 65.7 kg (144 lb 13.5 oz) 69.4 kg (153 lb)    Objective: Physical Exam: Filed Vitals:   02/08/16 2100 02/09/16 0533 02/09/16 0707 02/09/16 1400  BP: 99/66 103/75  115/72  Pulse: 95 59  92  Temp: 98.6 F (37 C) 97.6 F (36.4 C)  99 F (37.2 C)  TempSrc: Oral Oral  Oral  Resp: 16 14  16   Height:      Weight:   69.4 kg (153 lb)   SpO2: 98% 98%  100%    General: Appear in mild distress, no Rash; Oral Mucosa moist. Cardiovascular: S1 and S2 Present, no Murmur, no JVD Respiratory: Bilateral Air entry present and Clear to Auscultation, no Crackles, no wheezes Abdomen: Bowel Sound present, Soft and no tenderness Extremities: no Pedal edema, no calf tenderness Neurology: Pleasantly confused  Data Reviewed: CBC:  Recent Labs Lab 02/07/16 1646 02/08/16 0449  WBC 7.6 6.1  NEUTROABS  --  2.8  HGB 12.4 10.0*  HCT 34.9* 27.6*  MCV 95.6 95.8  PLT 215 [id number]   Basic Metabolic Panel:  Recent Labs Lab 02/07/16 1646  02/07/16 1800 02/07/16 2232 02/08/16 0433 02/09/16 0733  NA 128*  --  130* 134* 135  K 2.7*  --  2.7* 2.9* 3.4*  CL 92*  --  97* 101 103  CO2 27  --  22 26 24   GLUCOSE 186*  --  121* 93 110*  BUN 10  --  7 6 <5*  CREATININE 0.51  --  0.46 <0.30* 0.43*  CALCIUM 9.6  --  9.1 9.0 9.0  MG  --  1.6*  --  2.0 1.5*   Liver Function Tests:  Recent Labs Lab 02/07/16 1646 02/08/16 0433 02/09/16 0733  AST 209* 131* 91*  ALT 121* 89* 78*  ALKPHOS 236* 175* 154*  BILITOT 5.1* 3.6* 2.5*  PROT 7.7 6.3* 6.2*  ALBUMIN 3.7 3.0* 3.0*   No results for input(s): LIPASE, AMYLASE in the last 168 hours.  Recent Labs Lab 02/07/16 1828  AMMONIA 41*    Cardiac Enzymes:  Recent Labs Lab 02/07/16 1646  CKTOTAL 92    BNP (last 3 results) No results for input(s): BNP in the last 8760 hours.  CBG:  Recent Labs Lab 02/09/16 1044  GLUCAP 92    Recent Results (from the past 240 hour(s))  Urine culture     Status: None   Collection Time: 02/07/16 12:27 PM  Result Value Ref Range Status   Specimen Description URINE, CLEAN CATCH  Final   Special Requests NONE  Final   Culture   Final    MULTIPLE SPECIES PRESENT, SUGGEST RECOLLECTION Performed at [facility]    Report Status 02/09/2016 FINAL  Final     Studies: No results found.   Scheduled Meds: . enoxaparin (LOVENOX) injection  40 mg Subcutaneous Q24H  . folic acid  1 mg Oral Daily  . lactulose  20 g Oral BID  . magnesium sulfate 1 - 4 g bolus IVPB  2 g Intravenous Once  . multivitamin with minerals  1 tablet Oral Daily  . thiamine  100 mg Oral Daily   Or  . thiamine IV  100 mg Intravenous Daily   Continuous Infusions:  PRN Meds: acetaminophen **OR** acetaminophen, bisacodyl, HYDROcodone-acetaminophen, LORazepam **OR** LORazepam, ondansetron **OR** ondansetron (ZOFRAN) IV, polyethylene glycol  Time spent: 30 minutes  Author: Berle Mull, MD [facility] Pager: [phone] 02/09/2016 3:16 PM  If  7PM-7AM, please contact night-coverage at [email or web address], password [facility]
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
## 2016-02-09 NOTE — Progress Notes (Signed)
CSW spoke with patient's daughter, Myra Rude (cell#: [phone]) who lives in [place], [place] but is here in town visiting, daughter states that patient's home is being foreclosed and is unable to return to that home when discharged. CSW asked daughter about patient's husband, Barnie Alderman on facesheet - per daughter, he was arrested in December and is currently in [place]. Patient's daughter-in-law, Jerene Pitch (also on facesheet) lives in [place], but is currently out of town.   Patient's daughter informed CSW that patient's mother & sister live in [place] and are willing to take patient in, but patient is not agreeable at this time.   Awaiting psych evaluation for capacity.    Raynaldo Opitz, [facility] Clinical Social Worker cell #: [phone]

## 2016-02-09 NOTE — Progress Notes (Signed)
Key Points: Use following P&T approved IV to PO antibiotic change policy.  Description contains the criteria that are approved Note: Policy Excludes:  Esophagectomy patientsPHARMACIST - PHYSICIAN COMMUNICATION DR:   Posey Pronto CONCERNING: IV to Oral Route Change Policy  RECOMMENDATION: This patient is receiving Folic Acid by the intravenous route.  Based on criteria approved by the Pharmacy and Therapeutics Committee, the intravenous medication(s) is/are being converted to the equivalent oral dose form(s).   DESCRIPTION: These criteria include:  The patient is eating (either orally or via tube) and/or has been taking other orally administered medications for a least 24 hours  The patient has no evidence of active gastrointestinal bleeding or impaired GI absorption (gastrectomy, short bowel, patient on TNA or NPO).  If you have questions about this conversion, please contact the Pharmacy Department  []   [phone] )  Natalie Mcdaniel []   [phone] )  [name] []   [phone] )  Natalie Mcdaniel []   [phone] )  Natalie Mcdaniel [x]   [phone] )  Natalie Mcdaniel, [place], [facility] 02/09/2016 10:09 AM

## 2016-02-10 ENCOUNTER — Inpatient Hospital Stay (HOSPITAL_COMMUNITY): Payer: MEDICAID

## 2016-02-10 LAB — VITAMIN B1: Vitamin B1 (Thiamine): 43.6 nmol/L — ABNORMAL LOW (ref 66.5–200.0)

## 2016-02-10 LAB — GLUCOSE, CAPILLARY: GLUCOSE-CAPILLARY: 72 mg/dL (ref 65–99)

## 2016-02-10 MED ORDER — LORAZEPAM 2 MG/ML IJ SOLN: 0.5000 mg | Freq: Once | INTRAMUSCULAR | Status: DC

## 2016-02-10 MED ORDER — SODIUM CHLORIDE 0.9 % IV SOLN
500.0000 mg | Freq: Every day | INTRAVENOUS | Status: DC
  Administered 2016-02-10 – 2016-02-11 (×2): 500 mg via INTRAVENOUS
  Filled 2016-02-10: qty 4
  Filled 2016-02-10: qty 5

## 2016-02-10 MED ORDER — LORAZEPAM 2 MG/ML IJ SOLN
0.5000 mg | Freq: Once | INTRAMUSCULAR | Status: AC
  Administered 2016-02-10: 0.5 mg via INTRAVENOUS

## 2016-02-10 NOTE — Progress Notes (Signed)
Pt served IVC order from [employer]. Patient reluctantly laid down in the bed, and staff rolled her over, and I administered 0.5 mg Ativan IM per order as SL site was of questionable use. Pt was tearful during the event.  Will continue to monitor.
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
## 2016-02-10 NOTE — Progress Notes (Signed)
Attempted to call pt's daughter Duwaine Maxin to make her aware of pt's IVC status.  No one answered the phone, so I left a voicemail. Notified on oncoming nurse.

## 2016-02-10 NOTE — Progress Notes (Signed)
[facility] Progress Note  Patient: Natalie Mcdaniel [id number]   PCP: JEFFERY,CHELLE, PA-C DOB: 12-21-1966   DOA: 02/07/2016   DOS: 02/10/2016   Date of Service: the patient was seen and examined on 02/10/2016  Subjective: This morning the patient remains pleasantly confused. Tells me that year is 2016 and month is January despite ability to look at the calendar in front of her. Does not have any acute complaint. Overnight the patient was IVC after consultation with psychiatry again. Nutrition: Tolerating oral diet Activity: Ambulating in the room Last BM: 02/08/2016  Assessment and Plan: 1. Severe recurrent major depression without psychotic features ([place])  Acute encephalopathy  Patient was brought in by friend was concerned that the patient was not able to take care of herself. Patient lives alone. Patient has been that she is not aware of any liver disease. Patient does not have any asterixis, ammonia level was minimally elevated on admission. Given this I suspect that patient's confusion as well as delirium is less likely secondary to hepatic encephalopathy. We'll continue with lactulose for constipation. MRI brain for further workup regarding acute encephalopathy. Due to conflicting report from the psychiatric consultation I have discussed with psychiatry to clarify on current recommendation. Patient continues to remain IVC at present  2. Alcohol abuse. Currently on CIWA protocol no evidence of withdrawal at present.  3.  electrolyte imbalance. Improving rapidly with IV hydration and correction. We'll continue close monitoring. Prior history of gastric bypass with significant weight loss. We will need to monitor for refeeding syndrome as patient is likely not getting adequate nutrition at home.  4. pressure ulcers. Patient has significant pressure ulcers on the buttocks does not appear to be actively infected. Wound care consult has been appreciated. We will need to  monitor closely.  DVT Prophylaxis: subcutaneous Heparin Nutrition: regular diet Advance goals of care discussion: full code  Brief Summary of Hospitalization:  HPI: As per the H and P dictated on admission, "Natalie Mcdaniel is a 50 y.o. female with PMH of morbid obesity status post Roux-en-Y bypass, chronic alcoholism with no known history of DT or seizures, depression, and anxiety who presents from home via EMS after she was found on the floor by a friend who was concern for the patient's inability to adequately care for herself. Patient's friend is at the bedside and assists with the history. Patient has a long history of chronic alcohol abuse, citing this as the reason she has not followed with her PCP or adhered to her treatment plan. She has been largely confined to her bed for at least 6 months, explaining that her peripheral neuropathy precludes her ability to ambulate. Over this interval, she has developed pressure sores over her buttock. Her friend at the bedside reportedly checks on her several times a week, finding the patient's house to be in disarray with trash throughout and foul odor. Patient's friend reportedly brings meals when she visits, but the patient otherwise does not eat. She had previously obtained a bachelor's degree and worked for [employer] before her alcohol dependence made this impossible. She notes that since she has lost her job, she has not had money to buy alcohol, and states that unless a friend brings her son, she has had to go without. Her friend agrees that she has been drinking much less than previously when she would consume approximately 24 12-ounce beers daily.  In ED, patient was found to be afebrile, saturating well on room air, and with vital signs stable. Chest  x-ray was negative for acute cardiopulmonary disease and noncontrast head CT was negative for acute intracranial abnormality. EKG revealed a sinus rhythm with nonspecific ST changes in the inferior leads.  Basic lab work was obtained and CMP returns with sodium of 128, potassium 2.7, moderate elevations in transaminases, and a bilirubin of 5.1. CBC is unremarkable. Ethanol and troponin levels are undetectable, and ammonia returned slightly elevated to 41. Given the elevated transaminases and bilirubin, a contrast-enhanced CT of the abdomen was obtained and demonstrative of mild hepatic steatosis. Patient was bolused with 1 L normal saline in the emergency department, given 1 g of magnesium, 10 mEq IV potassium, and a single dose of Norco 5 mg. She remained hemodynamically stable in the emergency department and will be admitted for ongoing evaluation and management of encephalopathy with widespread electrolyte derangements." Daily update, Procedures: none Consultants: Psychiatric  Antibiotics: Anti-infectives    None      Family Communication: no family was present at bedside, at the time of interview.   Disposition:  Expected discharge date: 02/11/2016  Barriers to safe discharge: improvement in electrolyte status and mentation    Intake/Output Summary (Last 24 hours) at 02/10/16 1612 Last data filed at 02/10/16 1400  Gross per 24 hour  Intake    120 ml  Output    950 ml  Net   -830 ml   Filed Weights   02/08/16 0311 02/09/16 0707  Weight: 65.7 kg (144 lb 13.5 oz) 69.4 kg (153 lb)    Objective: Physical Exam: Filed Vitals:   02/09/16 1400 02/09/16 2104 02/10/16 0603 02/10/16 1400  BP: 115/72 116/73 109/61 113/68  Pulse: 92 76 69 84  Temp: 99 F (37.2 C) 98.4 F (36.9 C)  98.8 F (37.1 C)  TempSrc: Oral Oral Oral Oral  Resp: 16 16 16 16   Height:      Weight:      SpO2: 100% 100% 100% 100%    General: Appear in mild distress, no Rash; Oral Mucosa moist. Cardiovascular: S1 and S2 Present, no Murmur, no JVD Respiratory: Bilateral Air entry present and Clear to Auscultation, no Crackles, no wheezes Abdomen: Bowel Sound present, Soft and no tenderness Extremities: no Pedal  edema, no calf tenderness Neurology: Pleasantly confused  Data Reviewed: CBC:  Recent Labs Lab 02/07/16 1646 02/08/16 0449  WBC 7.6 6.1  NEUTROABS  --  2.8  HGB 12.4 10.0*  HCT 34.9* 27.6*  MCV 95.6 95.8  PLT 215 [id number]   Basic Metabolic Panel:  Recent Labs Lab 02/07/16 1646 02/07/16 1800 02/07/16 2232 02/08/16 0433 02/09/16 0733  NA 128*  --  130* 134* 135  K 2.7*  --  2.7* 2.9* 3.4*  CL 92*  --  97* 101 103  CO2 27  --  22 26 24   GLUCOSE 186*  --  121* 93 110*  BUN 10  --  7 6 <5*  CREATININE 0.51  --  0.46 <0.30* 0.43*  CALCIUM 9.6  --  9.1 9.0 9.0  MG  --  1.6*  --  2.0 1.5*   Liver Function Tests:  Recent Labs Lab 02/07/16 1646 02/08/16 0433 02/09/16 0733  AST 209* 131* 91*  ALT 121* 89* 78*  ALKPHOS 236* 175* 154*  BILITOT 5.1* 3.6* 2.5*  PROT 7.7 6.3* 6.2*  ALBUMIN 3.7 3.0* 3.0*   No results for input(s): LIPASE, AMYLASE in the last 168 hours.  Recent Labs Lab 02/07/16 1828  AMMONIA 41*    Cardiac Enzymes:  Recent Labs  Lab 02/07/16 1646  CKTOTAL 92    BNP (last 3 results) No results for input(s): BNP in the last 8760 hours.  CBG:  Recent Labs Lab 02/09/16 1044 02/10/16 0943  GLUCAP 92 72    Recent Results (from the past 240 hour(s))  Urine culture     Status: None   Collection Time: 02/07/16 12:27 PM  Result Value Ref Range Status   Specimen Description URINE, CLEAN CATCH  Final   Special Requests NONE  Final   Culture   Final    MULTIPLE SPECIES PRESENT, SUGGEST RECOLLECTION Performed at [facility]    Report Status 02/09/2016 FINAL  Final     Studies: Mr Brain Wo Contrast  02/10/2016  CLINICAL DATA:  Acute encephalopathy. EXAM: MRI HEAD WITHOUT CONTRAST TECHNIQUE: Multiplanar, multiecho pulse sequences of the brain and surrounding structures were obtained without intravenous contrast. COMPARISON:  CT head 02/07/2016 FINDINGS: Generalized atrophy.  Negative for hydrocephalus. Negative for acute infarct.  Scattered small white matter hyperintensities bilaterally consistent with mild chronic microvascular ischemia. Basal ganglia and brainstem normal. Negative for intracranial hemorrhage.  Negative for mass or edema. Normal skull base.  Paranasal sinuses clear.  Pituitary not enlarged Image quality degraded by motion IMPRESSION: Negative for acute infarct. Atrophy with mild chronic microvascular ischemia. Electronically Signed   By: Franchot Gallo M.D.   On: 02/10/2016 11:45     Scheduled Meds: . enoxaparin (LOVENOX) injection  40 mg Subcutaneous Q24H  . escitalopram  10 mg Oral Daily  . folic acid  1 mg Oral Daily  . lactulose  20 g Oral BID  . LORazepam  0.5 mg Intramuscular Once  . mirtazapine  7.5 mg Oral QHS  . multivitamin with minerals  1 tablet Oral Daily  . thiamine  100 mg Oral Daily   Or  . thiamine IV  100 mg Intravenous Daily   Continuous Infusions:  PRN Meds: acetaminophen **OR** acetaminophen, bisacodyl, HYDROcodone-acetaminophen, LORazepam **OR** LORazepam, ondansetron **OR** ondansetron (ZOFRAN) IV, polyethylene glycol  Time spent: 30 minutes  Author: Berle Mull, MD [facility] Pager: [phone] 02/10/2016 4:12 PM  If 7PM-7AM, please contact night-coverage at [email or web address], password [facility]
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
## 2016-02-10 NOTE — Progress Notes (Signed)
Pt resting quietly after given ativan.  Will continue to monitor.

## 2016-02-11 DIAGNOSIS — E5111 Dry beriberi: Secondary | ICD-10-CM

## 2016-02-11 LAB — CBC WITH DIFFERENTIAL/PLATELET
Basophils Absolute: 0.1 10*3/uL (ref 0.0–0.1)
Basophils Relative: 1 %
EOS ABS: 0.1 10*3/uL (ref 0.0–0.7)
Eosinophils Relative: 1 %
HEMATOCRIT: 28.8 % — AB (ref 36.0–46.0)
HEMOGLOBIN: 9.4 g/dL — AB (ref 12.0–15.0)
LYMPHS ABS: 2.9 10*3/uL (ref 0.7–4.0)
Lymphocytes Relative: 43 %
MCH: 33.3 pg (ref 26.0–34.0)
MCHC: 32.6 g/dL (ref 30.0–36.0)
MCV: 102.1 fL — ABNORMAL HIGH (ref 78.0–100.0)
MONOS PCT: 19 %
Monocytes Absolute: 1.2 10*3/uL — ABNORMAL HIGH (ref 0.1–1.0)
NEUTROS ABS: 2.4 10*3/uL (ref 1.7–7.7)
NEUTROS PCT: 36 %
Platelets: 455 10*3/uL — ABNORMAL HIGH (ref 150–400)
RBC: 2.82 MIL/uL — ABNORMAL LOW (ref 3.87–5.11)
RDW: 17.3 % — ABNORMAL HIGH (ref 11.5–15.5)
WBC: 6.6 10*3/uL (ref 4.0–10.5)

## 2016-02-11 LAB — COMPREHENSIVE METABOLIC PANEL
ALK PHOS: 126 U/L (ref 38–126)
ALT: 54 U/L (ref 14–54)
ANION GAP: 6 (ref 5–15)
AST: 48 U/L — ABNORMAL HIGH (ref 15–41)
Albumin: 2.7 g/dL — ABNORMAL LOW (ref 3.5–5.0)
BILIRUBIN TOTAL: 1.7 mg/dL — AB (ref 0.3–1.2)
BUN: 7 mg/dL (ref 6–20)
CALCIUM: 9 mg/dL (ref 8.9–10.3)
CO2: 23 mmol/L (ref 22–32)
Chloride: 108 mmol/L (ref 101–111)
Creatinine, Ser: 0.48 mg/dL (ref 0.44–1.00)
Glucose, Bld: 87 mg/dL (ref 65–99)
Potassium: 3.5 mmol/L (ref 3.5–5.1)
Sodium: 137 mmol/L (ref 135–145)
TOTAL PROTEIN: 5.7 g/dL — AB (ref 6.5–8.1)

## 2016-02-11 LAB — FOLATE RBC
FOLATE, RBC: 747 ng/mL (ref >498)
Folate, Hemolysate: 236.8 ng/mL
Hematocrit: 31.7 % — ABNORMAL LOW (ref 34.0–46.6)

## 2016-02-11 LAB — PROCALCITONIN: Procalcitonin: <0.1 ng/mL

## 2016-02-11 LAB — GLUCOSE, CAPILLARY: Glucose-Capillary: 77 mg/dL (ref 65–99)

## 2016-02-11 LAB — PROTIME-INR
INR: 1.06 (ref 0.00–1.49)
Prothrombin Time: 14 seconds (ref 11.6–15.2)

## 2016-02-11 LAB — MAGNESIUM: MAGNESIUM: 1.8 mg/dL (ref 1.7–2.4)

## 2016-02-11 MED ORDER — GABAPENTIN 100 MG PO CAPS
100.0000 mg | ORAL_CAPSULE | Freq: Every day | ORAL | Status: DC
  Administered 2016-02-11 – 2016-02-18 (×8): 100 mg via ORAL
  Filled 2016-02-11 (×9): qty 1

## 2016-02-11 MED ORDER — THIAMINE HCL 100 MG/ML IJ SOLN
500.0000 mg | Freq: Three times a day (TID) | INTRAVENOUS | Status: DC
  Administered 2016-02-11 – 2016-02-14 (×8): 500 mg via INTRAVENOUS
  Filled 2016-02-11: qty 3
  Filled 2016-02-11 (×2): qty 5
  Filled 2016-02-11: qty 2
  Filled 2016-02-11 (×6): qty 5

## 2016-02-11 NOTE — Progress Notes (Signed)
PT Cancellation Note  Patient Details Name: Natalie Mcdaniel MRN: [id number] DOB: 09/21/1966   Cancelled Treatment:    Reason Eval/Treat Not Completed: Other (comment); given pt recent significant improvement in mobility per notes in chart --PT will sign off at this time; Pt has been amb to nurses station and in her room;   [facility] 02/11/2016, 8:58 AM

## 2016-02-11 NOTE — Progress Notes (Addendum)
[facility] Progress Note  Patient: Natalie Mcdaniel [id number]   PCP: JEFFERY,CHELLE, PA-C DOB: March 22, 1966   DOA: 02/07/2016   DOS: 02/11/2016   Date of Service: the patient was seen and examined on 02/11/2016  Subjective: Patient does not have any Complaint of nausea or vomiting. She is able to remember her daughter's name as well as birthday but does not remember current date or year. She is understanding the need for the treatment and agreeing to follow through. Nutrition: Tolerating oral diet Activity: Ambulating in the room Last BM: 02/08/2016  Assessment and Plan: 1. Severe recurrent major depression without psychotic features ([place])  Acute encephalopathy  Patient was brought in by friend was concerned that the patient was not able to take care of herself. Patient lives alone. Patient has been that she is not aware of any liver disease. Patient does not have any asterixis, ammonia level was minimally elevated on admission. MRI brain for further workup regarding acute encephalopathy. Patient continues to remain IVC at present.  She has vitamin B-1 deficiency with nystagmus as well as ataxia as documented from the evaluation on admission as well as physical therapy evaluation. Thus she is meeting the criteria for wernicke encephalopathy. Given this picture with rapid improvement I would continue to treat the patient with IV vitamin B-1 and monitor her progress. Per protocol pt will need 500 mg IV thiamine tid, for two consecutive days and 250 mg intravenously or intramuscularly once daily for an additional five days.  2. Alcohol abuse. Vitamin B-1 deficiency  Currently on CIWA protocol no evidence of withdrawal at present. and continue IV B-1 treatment  3.  electrolyte imbalance. Improving rapidly with IV hydration and correction. We'll continue close monitoring.  4. pressure ulcers. Patient has significant pressure ulcers on the buttocks does not appear to be actively  infected. Wound care consult has been appreciated. We will need to monitor closely.  DVT Prophylaxis: subcutaneous Heparin Nutrition: regular diet Advance goals of care discussion: full code  Brief Summary of Hospitalization:  HPI: As per the H and P dictated on admission, "Natalie Mcdaniel is a 50 y.o. female with PMH of morbid obesity status post Roux-en-Y bypass, chronic alcoholism with no known history of DT or seizures, depression, and anxiety who presents from home via EMS after she was found on the floor by a friend who was concern for the patient's inability to adequately care for herself. Patient's friend is at the bedside and assists with the history. Patient has a long history of chronic alcohol abuse, citing this as the reason she has not followed with her PCP or adhered to her treatment plan. She has been largely confined to her bed for at least 6 months, explaining that her peripheral neuropathy precludes her ability to ambulate. Over this interval, she has developed pressure sores over her buttock. Her friend at the bedside reportedly checks on her several times a week, finding the patient's house to be in disarray with trash throughout and foul odor. Patient's friend reportedly brings meals when she visits, but the patient otherwise does not eat. She had previously obtained a bachelor's degree and worked for [employer] before her alcohol dependence made this impossible. She notes that since she has lost her job, she has not had money to buy alcohol, and states that unless a friend brings her son, she has had to go without. Her friend agrees that she has been drinking much less than previously when she would consume approximately 24 12-ounce beers  daily.  In ED, patient was found to be afebrile, saturating well on room air, and with vital signs stable. Chest x-ray was negative for acute cardiopulmonary disease and noncontrast head CT was negative for acute intracranial abnormality. EKG revealed  a sinus rhythm with nonspecific ST changes in the inferior leads. Basic lab work was obtained and CMP returns with sodium of 128, potassium 2.7, moderate elevations in transaminases, and a bilirubin of 5.1. CBC is unremarkable. Ethanol and troponin levels are undetectable, and ammonia returned slightly elevated to 41. Given the elevated transaminases and bilirubin, a contrast-enhanced CT of the abdomen was obtained and demonstrative of mild hepatic steatosis. Patient was bolused with 1 L normal saline in the emergency department, given 1 g of magnesium, 10 mEq IV potassium, and a single dose of Norco 5 mg. She remained hemodynamically stable in the emergency department and will be admitted for ongoing evaluation and management of encephalopathy with widespread electrolyte derangements." Daily update, Procedures: none Consultants: Psychiatric  Antibiotics: Anti-infectives    None      Family Communication: family was present at bedside, at the time of interview.   all questions were answered satisfactorily  Disposition:  Expected discharge date: 02/13/2016  Barriers to safe discharge: improvement in electrolyte status and mentation    Intake/Output Summary (Last 24 hours) at 02/11/16 1952 Last data filed at 02/11/16 1903  Gross per 24 hour  Intake    770 ml  Output      0 ml  Net    770 ml   Filed Weights   02/08/16 0311 02/09/16 0707  Weight: 65.7 kg (144 lb 13.5 oz) 69.4 kg (153 lb)    Objective: Physical Exam: Filed Vitals:   02/10/16 1400 02/10/16 1700 02/10/16 2017 02/11/16 1358  BP: 113/68 103/60 104/60 112/71  Pulse: 84 97 84 88  Temp: 98.8 F (37.1 C)  97.5 F (36.4 C) 98.7 F (37.1 C)  TempSrc: Oral  Oral Oral  Resp: 16 16 16 16   Height:      Weight:      SpO2: 100% 100% 100% 100%    General: Appear in mild distress, no Rash; Oral Mucosa moist. Cardiovascular: S1 and S2 Present, no Murmur, no JVD Respiratory: Bilateral Air entry present and Clear to Auscultation,  no Crackles, no wheezes Abdomen: Bowel Sound present, Soft and no tenderness Extremities: no Pedal edema, no calf tenderness Neurology: Pleasantly confused  Data Reviewed: CBC:  Recent Labs Lab 02/07/16 1646 02/07/16 2234 02/08/16 0449 02/11/16 0443  WBC 7.6  --  6.1 6.6  NEUTROABS  --   --  2.8 2.4  HGB 12.4  --  10.0* 9.4*  HCT 34.9* 31.7* 27.6* 28.8*  MCV 95.6  --  95.8 102.1*  PLT 215  --  231 [id number]*   Basic Metabolic Panel:  Recent Labs Lab 02/07/16 1646 02/07/16 1800 02/07/16 2232 02/08/16 0433 02/09/16 0733 02/11/16 0443  NA 128*  --  130* 134* 135 137  K 2.7*  --  2.7* 2.9* 3.4* 3.5  CL 92*  --  97* 101 103 108  CO2 27  --  22 26 24 23   GLUCOSE 186*  --  121* 93 110* 87  BUN 10  --  7 6 <5* 7  CREATININE 0.51  --  0.46 <0.30* 0.43* 0.48  CALCIUM 9.6  --  9.1 9.0 9.0 9.0  MG  --  1.6*  --  2.0 1.5* 1.8   Liver Function Tests:  Recent Labs Lab  02/07/16 1646 02/08/16 0433 02/09/16 0733 02/11/16 0443  AST 209* 131* 91* 48*  ALT 121* 89* 78* 54  ALKPHOS 236* 175* 154* 126  BILITOT 5.1* 3.6* 2.5* 1.7*  PROT 7.7 6.3* 6.2* 5.7*  ALBUMIN 3.7 3.0* 3.0* 2.7*   No results for input(s): LIPASE, AMYLASE in the last 168 hours.  Recent Labs Lab 02/07/16 1828  AMMONIA 41*    Cardiac Enzymes:  Recent Labs Lab 02/07/16 1646  CKTOTAL 92    BNP (last 3 results) No results for input(s): BNP in the last 8760 hours.  CBG:  Recent Labs Lab 02/09/16 1044 02/10/16 0943 02/11/16 0714  GLUCAP 92 72 77    Recent Results (from the past 240 hour(s))  Urine culture     Status: None   Collection Time: 02/07/16 12:27 PM  Result Value Ref Range Status   Specimen Description URINE, CLEAN CATCH  Final   Special Requests NONE  Final   Culture   Final    MULTIPLE SPECIES PRESENT, SUGGEST RECOLLECTION Performed at [facility]    Report Status 02/09/2016 FINAL  Final     Studies: No results found.   Scheduled Meds: . enoxaparin (LOVENOX)  injection  40 mg Subcutaneous Q24H  . escitalopram  10 mg Oral Daily  . folic acid  1 mg Oral Daily  . gabapentin  100 mg Oral QHS  . lactulose  20 g Oral BID  . LORazepam  0.5 mg Intramuscular Once  . mirtazapine  7.5 mg Oral QHS  . multivitamin with minerals  1 tablet Oral Daily  . thiamine IV  500 mg Intravenous TID   Continuous Infusions:  PRN Meds: acetaminophen **OR** acetaminophen, bisacodyl, HYDROcodone-acetaminophen, ondansetron **OR** ondansetron (ZOFRAN) IV, polyethylene glycol  Time spent: 30 minutes  Author: Berle Mull, MD [facility] Pager: [phone] 02/11/2016 7:52 PM  If 7PM-7AM, please contact night-coverage at [email or web address], password [facility]

## 2016-02-12 DIAGNOSIS — Z9884 Bariatric surgery status: Secondary | ICD-10-CM

## 2016-02-12 DIAGNOSIS — Z9889 Other specified postprocedural states: Secondary | ICD-10-CM

## 2016-02-12 DIAGNOSIS — F09 Unspecified mental disorder due to known physiological condition: Secondary | ICD-10-CM

## 2016-02-12 DIAGNOSIS — E512 Wernicke's encephalopathy: Secondary | ICD-10-CM | POA: Diagnosis present

## 2016-02-12 LAB — BASIC METABOLIC PANEL
ANION GAP: 6 (ref 5–15)
BUN: <5 mg/dL — ABNORMAL LOW (ref 6–20)
CHLORIDE: 112 mmol/L — AB (ref 101–111)
CO2: 22 mmol/L (ref 22–32)
CREATININE: 0.42 mg/dL — AB (ref 0.44–1.00)
Calcium: 9.2 mg/dL (ref 8.9–10.3)
GFR calc non Af Amer: >60 mL/min (ref >60)
Glucose, Bld: 100 mg/dL — ABNORMAL HIGH (ref 65–99)
Potassium: 3.5 mmol/L (ref 3.5–5.1)
Sodium: 140 mmol/L (ref 135–145)

## 2016-02-12 LAB — MAGNESIUM: Magnesium: 1.9 mg/dL (ref 1.7–2.4)

## 2016-02-12 LAB — GLUCOSE, CAPILLARY: GLUCOSE-CAPILLARY: 74 mg/dL (ref 65–99)

## 2016-02-12 MED ORDER — FERROUS SULFATE 325 (65 FE) MG PO TABS
325.0000 mg | ORAL_TABLET | Freq: Three times a day (TID) | ORAL | Status: DC
  Administered 2016-02-12 – 2016-02-19 (×20): 325 mg via ORAL
  Filled 2016-02-12 (×23): qty 1

## 2016-02-12 NOTE — Progress Notes (Signed)
[facility] Progress Note  Patient: Natalie Mcdaniel [id number]   PCP: JEFFERY,CHELLE, PA-C DOB: [birth year]   DOA: 02/07/2016   DOS: 02/12/2016   Date of Service: the patient was seen and examined on 02/12/2016  Subjective: Patient has removed her IV line. Patient repeats same questions again and again and has been more confused this morning. Nutrition: Tolerating oral diet Activity: Ambulating in the room Last BM: 02/08/2016  Assessment and Plan: 1. Wernicke encephalopathy   Patient was brought in by friend was concerned that the patient was not able to take care of herself. Patient lives alone. Patient has been that she is not aware of any liver disease. Patient does not have any asterixis, ammonia level was minimally elevated on admission. MRI brain for further workup regarding acute encephalopathy. Psychiatry recommends to continue IVC as well as their assessment suggested the patient does not have any capacity to make medical decision.  She has vitamin B-1 deficiency with nystagmus as well as ataxia as documented from the evaluation on admission as well as physical therapy evaluation. Thus she is meeting the criteria for wernicke encephalopathy. Given this picture with rapid improvement I would continue to treat the patient with IV vitamin B-1 and monitor her progress.  Per protocol pt will need 500 mg IV thiamine tid, for two consecutive days and 250 mg intravenously or intramuscularly once daily for an additional five days.  2. Alcohol abuse. Vitamin B-1 deficiency  Currently on CIWA protocol no evidence of withdrawal at present. and continue IV B-1 treatment  3.  electrolyte imbalance. Improving rapidly with IV hydration and correction. We'll continue close monitoring.  4. pressure ulcers. Patient has significant pressure ulcers on the buttocks does not appear to be actively infected. Wound care consult has been appreciated. We will need to monitor closely.  DVT  Prophylaxis: subcutaneous Heparin Nutrition: regular diet Advance goals of care discussion: full code  Brief Summary of Hospitalization:  HPI: As per the H and P dictated on admission, "IZELA HOUGHTON is a 50 y.o. female with PMH of morbid obesity status post Roux-en-Y bypass, chronic alcoholism with no known history of DT or seizures, depression, and anxiety who presents from home via EMS after she was found on the floor by a friend who was concern for the patient's inability to adequately care for herself. Patient's friend is at the bedside and assists with the history. Patient has a long history of chronic alcohol abuse, citing this as the reason she has not followed with her PCP or adhered to her treatment plan. She has been largely confined to her bed for at least 6 months, explaining that her peripheral neuropathy precludes her ability to ambulate. Over this interval, she has developed pressure sores over her buttock. Her friend at the bedside reportedly checks on her several times a week, finding the patient's house to be in disarray with trash throughout and foul odor. Patient's friend reportedly brings meals when she visits, but the patient otherwise does not eat. She had previously obtained a bachelor's degree and worked for [employer] before her alcohol dependence made this impossible. She notes that since she has lost her job, she has not had money to buy alcohol, and states that unless a friend brings her son, she has had to go without. Her friend agrees that she has been drinking much less than previously when she would consume approximately 24 12-ounce beers daily.  In ED, patient was found to be afebrile, saturating well on room air,  and with vital signs stable. Chest x-ray was negative for acute cardiopulmonary disease and noncontrast head CT was negative for acute intracranial abnormality. EKG revealed a sinus rhythm with nonspecific ST changes in the inferior leads. Basic lab work was obtained  and CMP returns with sodium of 128, potassium 2.7, moderate elevations in transaminases, and a bilirubin of 5.1. CBC is unremarkable. Ethanol and troponin levels are undetectable, and ammonia returned slightly elevated to 41. Given the elevated transaminases and bilirubin, a contrast-enhanced CT of the abdomen was obtained and demonstrative of mild hepatic steatosis. Patient was bolused with 1 L normal saline in the emergency department, given 1 g of magnesium, 10 mEq IV potassium, and a single dose of Norco 5 mg. She remained hemodynamically stable in the emergency department and will be admitted for ongoing evaluation and management of encephalopathy with widespread electrolyte derangements." Daily update, Procedures: none Consultants: Psychiatric  Antibiotics: Anti-infectives    None      Family Communication: family was present at bedside, at the time of interview.   all questions were answered satisfactorily  Disposition:  Expected discharge date: 02/13/2016  Barriers to safe discharge: improvement in electrolyte status and mentation    Intake/Output Summary (Last 24 hours) at 02/12/16 1237 Last data filed at 02/12/16 0003  Gross per 24 hour  Intake    852 ml  Output      0 ml  Net    852 ml   Filed Weights   02/08/16 0311 02/09/16 0707  Weight: 65.7 kg (144 lb 13.5 oz) 69.4 kg (153 lb)    Objective: Physical Exam: Filed Vitals:   02/10/16 2017 02/11/16 1358 02/11/16 2051 02/12/16 0541  BP: 104/60 112/71 112/75 98/50  Pulse: 84 88 84 89  Temp: 97.5 F (36.4 C) 98.7 F (37.1 C) 99.2 F (37.3 C) 99.2 F (37.3 C)  TempSrc: Oral Oral Oral Oral  Resp: 16 16 14 16   Height:      Weight:      SpO2: 100% 100% 98% 100%    General: Appear in mild distress, no Rash; Oral Mucosa moist. Cardiovascular: S1 and S2 Present, no Murmur, no JVD Respiratory: Bilateral Air entry present and Clear to Auscultation, no Crackles, no wheezes Abdomen: Bowel Sound present, Soft and no  tenderness Extremities: no Pedal edema, no calf tenderness Neurology: Pleasantly confused  Data Reviewed: CBC:  Recent Labs Lab 02/07/16 1646 02/07/16 2234 02/08/16 0449 02/11/16 0443  WBC 7.6  --  6.1 6.6  NEUTROABS  --   --  2.8 2.4  HGB 12.4  --  10.0* 9.4*  HCT 34.9* 31.7* 27.6* 28.8*  MCV 95.6  --  95.8 102.1*  PLT 215  --  231 [id number]*   Basic Metabolic Panel:  Recent Labs Lab 02/07/16 1800 02/07/16 2232 02/08/16 0433 02/09/16 0733 02/11/16 0443 02/12/16 0451  NA  --  130* 134* 135 137 140  K  --  2.7* 2.9* 3.4* 3.5 3.5  CL  --  97* 101 103 108 112*  CO2  --  22 26 24 23 22   GLUCOSE  --  121* 93 110* 87 100*  BUN  --  7 6 <5* 7 <5*  CREATININE  --  0.46 <0.30* 0.43* 0.48 0.42*  CALCIUM  --  9.1 9.0 9.0 9.0 9.2  MG 1.6*  --  2.0 1.5* 1.8 1.9   Liver Function Tests:  Recent Labs Lab 02/07/16 1646 02/08/16 0433 02/09/16 0733 02/11/16 0443  AST 209* 131* 91* 48*  ALT 121* 89* 78* 54  ALKPHOS 236* 175* 154* 126  BILITOT 5.1* 3.6* 2.5* 1.7*  PROT 7.7 6.3* 6.2* 5.7*  ALBUMIN 3.7 3.0* 3.0* 2.7*   No results for input(s): LIPASE, AMYLASE in the last 168 hours.  Recent Labs Lab 02/07/16 1828  AMMONIA 41*    Cardiac Enzymes:  Recent Labs Lab 02/07/16 1646  CKTOTAL 92    BNP (last 3 results) No results for input(s): BNP in the last 8760 hours.  CBG:  Recent Labs Lab 02/09/16 1044 02/10/16 0943 02/11/16 0714 02/12/16 0720  GLUCAP 92 72 77 74    Recent Results (from the past 240 hour(s))  Urine culture     Status: None   Collection Time: 02/07/16 12:27 PM  Result Value Ref Range Status   Specimen Description URINE, CLEAN CATCH  Final   Special Requests NONE  Final   Culture   Final    MULTIPLE SPECIES PRESENT, SUGGEST RECOLLECTION Performed at [facility]    Report Status 02/09/2016 FINAL  Final     Studies: No results found.   Scheduled Meds: . enoxaparin (LOVENOX) injection  40 mg Subcutaneous Q24H  . escitalopram   10 mg Oral Daily  . ferrous sulfate  325 mg Oral TID WC  . folic acid  1 mg Oral Daily  . gabapentin  100 mg Oral QHS  . lactulose  20 g Oral BID  . LORazepam  0.5 mg Intramuscular Once  . mirtazapine  7.5 mg Oral QHS  . multivitamin with minerals  1 tablet Oral Daily  . thiamine IV  500 mg Intravenous TID   Continuous Infusions:  PRN Meds: acetaminophen **OR** acetaminophen, bisacodyl, HYDROcodone-acetaminophen, ondansetron **OR** ondansetron (ZOFRAN) IV, polyethylene glycol  Time spent: 30 minutes  Author: Berle Mull, MD [facility] Pager: [phone] 02/12/2016 12:37 PM  If 7PM-7AM, please contact night-coverage at [email or web address], password [facility]

## 2016-02-12 NOTE — Progress Notes (Signed)
02/12/16 9:18AM : Dr Louretta Shorten paged in attempt to see if he is planning to see patient. Explained in [place] text that daughter wants to see him. Informed daughter of page to MD.  Donne Hazel, RN
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
## 2016-02-12 NOTE — Progress Notes (Signed)
02/12/16 0900: Dr Posey Pronto paged for daughter, who is asking when psychiatrist is coming. Daughter is here specifically to see psychiatrist and is upset that one has not arrived. Donne Hazel, RN

## 2016-02-13 DIAGNOSIS — K5900 Constipation, unspecified: Secondary | ICD-10-CM

## 2016-02-13 LAB — COMPREHENSIVE METABOLIC PANEL
ALBUMIN: 2.8 g/dL — AB (ref 3.5–5.0)
ALK PHOS: 111 U/L (ref 38–126)
ALT: 38 U/L (ref 14–54)
ANION GAP: 8 (ref 5–15)
AST: 34 U/L (ref 15–41)
BILIRUBIN TOTAL: 1.2 mg/dL (ref 0.3–1.2)
BUN: <5 mg/dL — ABNORMAL LOW (ref 6–20)
CALCIUM: 9.1 mg/dL (ref 8.9–10.3)
CO2: 24 mmol/L (ref 22–32)
Chloride: 110 mmol/L (ref 101–111)
Creatinine, Ser: 0.48 mg/dL (ref 0.44–1.00)
GFR calc Af Amer: >60 mL/min (ref >60)
GLUCOSE: 80 mg/dL (ref 65–99)
POTASSIUM: 3.6 mmol/L (ref 3.5–5.1)
Sodium: 142 mmol/L (ref 135–145)
TOTAL PROTEIN: 5.7 g/dL — AB (ref 6.5–8.1)

## 2016-02-13 LAB — GLUCOSE, CAPILLARY: GLUCOSE-CAPILLARY: 108 mg/dL — AB (ref 65–99)

## 2016-02-13 LAB — CBC
HEMATOCRIT: 28.6 % — AB (ref 36.0–46.0)
HEMOGLOBIN: 9.5 g/dL — AB (ref 12.0–15.0)
MCH: 34.1 pg — ABNORMAL HIGH (ref 26.0–34.0)
MCHC: 33.2 g/dL (ref 30.0–36.0)
MCV: 102.5 fL — ABNORMAL HIGH (ref 78.0–100.0)
Platelets: 459 10*3/uL — ABNORMAL HIGH (ref 150–400)
RBC: 2.79 MIL/uL — ABNORMAL LOW (ref 3.87–5.11)
RDW: 17.5 % — AB (ref 11.5–15.5)
WBC: 5.8 10*3/uL (ref 4.0–10.5)

## 2016-02-13 LAB — MAGNESIUM: MAGNESIUM: 1.9 mg/dL (ref 1.7–2.4)

## 2016-02-13 NOTE — NC FL2 (Cosign Needed)
[place] LEVEL OF CARE SCREENING TOOL     IDENTIFICATION  Patient Name: Natalie Mcdaniel Birthdate: 05-11-1966 Sex: female Admission Date (Current Location): 02/07/2016  [facility] and [place] Number:  Herbalist and Address:  [facility],  [address], [place]      Provider Number: [id number]  Attending Physician Name and Address:  Eugenie Filler, MD  Relative Name and Phone Number:       Current Level of Care: Hospital Recommended Level of Care: [place] Prior Approval Number:    Date Approved/Denied:   PASRR Number:    Discharge Plan: SNF    Current Diagnoses: Patient Active Problem List   Diagnosis Date Noted  . Wernicke encephalopathy 02/12/2016  . 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  . 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 Hypokalemia 02/07/2016  . Serum total bilirubin elevated 02/07/2016  . Hepatic steatosis 02/07/2016  . Acute encephalopathy 02/07/2016  . Hyperglycemia 02/07/2016  . Toxic metabolic encephalopathy [id number]  . Skin erosion   . Cellulitis 03/01/2015  . Hyponatremia 03/01/2015  . Transaminitis 03/01/2015  . Decubitus ulcer 03/01/2015  . 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[place]) 03/01/2015  . Alcohol dependence with uncomplicated withdrawal ([place])   . Severe recurrent major depression without psychotic features ([place]) 12/20/2014  . Substance induced mood disorder ([place]) 12/20/2014  . Neuropathy ([place]) 12/20/2014  . Alcohol dependence ([place]) 12/19/2014  . Depression 02/05/2012    Orientation RESPIRATION BLADDER Height & Weight     Self (Fluctuating to time, situation, and place.)  Normal Incontinent Weight: 146 lb 13.2 oz (66.6 kg) Height:  5' 6.5" (168.9 cm)  BEHAVIORAL SYMPTOMS/MOOD NEUROLOGICAL BOWEL NUTRITION STATUS      Incontinent Diet (Regular with thin consistency)  AMBULATORY STATUS COMMUNICATION OF NEEDS Skin   Limited Assist Verbally PU Stage and Appropriate Care    PU Stage 2 Dressing:  (PRN)                   Personal Care Assistance Level of Assistance  Bathing Bathing Assistance: Limited assistance         Functional Limitations Info             SPECIAL CARE FACTORS FREQUENCY   (Thiamine IV treatment )                    Contractures Contractures Info: Not present    Additional Factors Info  Code Status, Allergies, Psychotropic Code Status Info: Full Code  Allergies Info: Bee Venom, Wasp Venom Psychotropic Info: gabapentin (NEURONTIN) capsule, LORazepam (ATIVAN) injection 0.5 mg , mirtazapine (REMERON) tablet.  escitalopram (LEXAPRO),          Current Medications (02/13/2016):  This is the current hospital active medication list Current Facility-Administered Medications  Medication Dose Route Frequency Provider Last Rate Last Dose  . acetaminophen (TYLENOL) tablet 650 mg  650 mg Oral Q6H PRN Vianne Bulls, MD       Or  . acetaminophen (TYLENOL) suppository 650 mg  650 mg Rectal Q6H PRN Vianne Bulls, MD      . bisacodyl (DULCOLAX) EC tablet 5 mg  5 mg Oral Daily PRN Ilene Qua Opyd, MD      . enoxaparin (LOVENOX) injection 40 mg  40 mg Subcutaneous Q24H Vianne Bulls, MD   40 mg at 02/12/16 2231  . escitalopram (LEXAPRO) tablet 10 mg  10 mg Oral Daily Ambrose Finland, MD   10 mg at 02/12/16 1100  .  ferrous sulfate tablet 325 mg  325 mg Oral TID WC Lavina Hamman, MD   325 mg at 02/13/16 0830  . folic acid (FOLVITE) tablet 1 mg  1 mg Oral Daily Thomes Lolling, RPH   1 mg at 02/12/16 1100  . gabapentin (NEURONTIN) capsule 100 mg  100 mg Oral QHS Lavina Hamman, MD   100 mg at 02/12/16 2231  . HYDROcodone-acetaminophen (NORCO/VICODIN) 5-325 MG per tablet 1-2 tablet  1-2 tablet Oral Q4H PRN Vianne Bulls, MD   1 tablet at 02/13/16 0020  . lactulose (CHRONULAC) 10 GM/15ML solution 20 g  20 g Oral BID Vianne Bulls, MD   20 g at 02/12/16 2231  . LORazepam (ATIVAN) injection 0.5 mg  0.5 mg Intramuscular Once  Willia Craze, NP   0.5 mg at 02/10/16 0543  . mirtazapine (REMERON) tablet 7.5 mg  7.5 mg Oral QHS Ambrose Finland, MD   7.5 mg at 02/12/16 2231  . multivitamin with minerals tablet 1 tablet  1 tablet Oral Daily Vianne Bulls, MD   1 tablet at 02/12/16 1100  . ondansetron (ZOFRAN) tablet 4 mg  4 mg Oral Q6H PRN Vianne Bulls, MD       Or  . ondansetron (ZOFRAN) injection 4 mg  4 mg Intravenous Q6H PRN Ilene Qua Opyd, MD      . polyethylene glycol (MIRALAX / GLYCOLAX) packet 17 g  17 g Oral Daily PRN Vianne Bulls, MD      . thiamine 500mg  in normal saline (98ml) IVPB  500 mg Intravenous TID Lavina Hamman, MD   500 mg at 02/12/16 2231     Discharge Medications: Please see discharge summary for a list of discharge medications.  Relevant Imaging Results:  Relevant Lab Results:   Additional Information SSN:  [id number]  Pete Pelt

## 2016-02-13 NOTE — Progress Notes (Addendum)
CSW faxed requested Pt information to [facility] so that Pt can receive a level 2 PASRR.   Pt's daughter gives permission for CSW to fax Pt information.   CSW will continue to follow Pt for d/c planning.    Pete Pelt [facility]  [phone]

## 2016-02-13 NOTE — Progress Notes (Signed)
02/13/16 1225: Per Dr. Biagio Borg request, call to Social Worker Cassandra to let her know that pt's daughter is leaving today and won't be back until Saturday. Dr Grandville Silos asking about pt disposition and plans for discharge. Cassandra states they "will work out a plan". Donne Hazel, RN

## 2016-02-13 NOTE — Progress Notes (Signed)
[facility] PROGRESS NOTE  TONGA DOMENICK [id number] DOB: [birth year] DOA: 02/07/2016 PCP: JEFFERY,CHELLE, PA-C  Brief interval history As per the H and P dictated on admission, "Natalie Mcdaniel is a 50 y.o. female with PMH of morbid obesity status post Roux-en-Y bypass, chronic alcoholism with no known history of DT or seizures, depression, and anxiety who presents from home via EMS after she was found on the floor by a friend who was concern for the patient's inability to adequately care for herself. Patient's friend is at the bedside and assists with the history. Patient has a long history of chronic alcohol abuse, citing this as the reason she has not followed with her PCP or adhered to her treatment plan. She has been largely confined to her bed for at least 6 months, explaining that her peripheral neuropathy precludes her ability to ambulate. Over this interval, she has developed pressure sores over her buttock. Her friend at the bedside reportedly checks on her several times a week, finding the patient's house to be in disarray with trash throughout and foul odor. Patient's friend reportedly brings meals when she visits, but the patient otherwise does not eat. She had previously obtained a bachelor's degree and worked for [employer] before her alcohol dependence made this impossible. She notes that since she has lost her job, she has not had money to buy alcohol, and states that unless a friend brings her son, she has had to go without. Her friend agrees that she has been drinking much less than previously when she would consume approximately 24 12-ounce beers daily.  In ED, patient was found to be afebrile, saturating well on room air, and with vital signs stable. Chest x-ray was negative for acute cardiopulmonary disease and noncontrast head CT was negative for acute intracranial abnormality. EKG revealed a sinus rhythm with nonspecific ST changes in the inferior leads. Basic lab work was  obtained and CMP returns with sodium of 128, potassium 2.7, moderate elevations in transaminases, and a bilirubin of 5.1. CBC is unremarkable. Ethanol and troponin levels are undetectable, and ammonia returned slightly elevated to 41. Given the elevated transaminases and bilirubin, a contrast-enhanced CT of the abdomen was obtained and demonstrative of mild hepatic steatosis. Patient was bolused with 1 L normal saline in the emergency department, given 1 g of magnesium, 10 mEq IV potassium, and a single dose of Norco 5 mg. She remained hemodynamically stable in the emergency department and will be admitted for ongoing evaluation and management of encephalopathy with widespread electrolyte derangements."    Assessment/Plan: #1 Wernicke encephalopathy It was noted that patient was brought in by a friend that she was unable to care for herself and living alone. Patient did not have any asterixis. Ammonia level was minimally elevated on admission. MRI of the head was negative for any acute infarct. Patient was noted to have a vitamin B-1 deficiency with some nystagmus as well as ataxia as documented from evaluation on admission and as per PT. Patient was meeting criteria for Wernicke encephalopathy. Continue IV vitamin B-1, IV thiamine. Patient has been seen in consultation by psychiatry who recommended to continue IVC and patient did not have capacity to make her own decisions. Patient likely needs placement of patient lives alone.  #2 alcohol abuse/vitamin B-1 deficiency Continue the Ativan withdrawal protocol. Continue vitamin B-1 replacement.  #3 hypokalemia/hypomagnesemia Repleted.  #4 constipation Continue lactulose.  #5 pressure ulcers Patient with pressure ulcers noted on buttocks. Frequent turning. Monitor.   Code Status: Full  Family Communication: Updated patient and daughter at bedside. Disposition Plan: Patient will likely need placement.   Consultants:  Psychiatry: Dr.  Louretta Shorten 02/09/2016  Procedures:  CT abdomen and pelvis 02/07/2016,  CT head 02/07/2016  Chest x-ray 02/07/2016  MRI head 02/10/2016  Antibiotics:  None  HPI/Subjective: Patient denies chest pain. No shortness of breath.  Objective: Filed Vitals:   02/12/16 2121 02/13/16 0550  BP: 121/77 127/72  Pulse: 86 73  Temp: 99.3 F (37.4 C) 99.1 F (37.3 C)  Resp: 16 16    Intake/Output Summary (Last 24 hours) at 02/13/16 1234 Last data filed at 02/13/16 0500  Gross per 24 hour  Intake    840 ml  Output      0 ml  Net    840 ml   Filed Weights   02/08/16 0311 02/09/16 0707 02/13/16 0550  Weight: 65.7 kg (144 lb 13.5 oz) 69.4 kg (153 lb) 66.6 kg (146 lb 13.2 oz)    Exam:   General:  NAD  Cardiovascular: RRR  Respiratory: CTAB  Abdomen: Soft, nontender, nondistended, positive bowel sounds.  Musculoskeletal: No clubbing cyanosis or edema.  Data Reviewed: Basic Metabolic Panel:  Recent Labs Lab 02/08/16 0433 02/09/16 0733 02/11/16 0443 02/12/16 0451 02/13/16 0447  NA 134* 135 137 140 142  K 2.9* 3.4* 3.5 3.5 3.6  CL 101 103 108 112* 110  CO2 26 24 23 22 24   GLUCOSE 93 110* 87 100* 80  BUN 6 <5* 7 <5* <5*  CREATININE <0.30* 0.43* 0.48 0.42* 0.48  CALCIUM 9.0 9.0 9.0 9.2 9.1  MG 2.0 1.5* 1.8 1.9 1.9   Liver Function Tests:  Recent Labs Lab 02/07/16 1646 02/08/16 0433 02/09/16 0733 02/11/16 0443 02/13/16 0447  AST 209* 131* 91* 48* 34  ALT 121* 89* 78* 54 38  ALKPHOS 236* 175* 154* 126 111  BILITOT 5.1* 3.6* 2.5* 1.7* 1.2  PROT 7.7 6.3* 6.2* 5.7* 5.7*  ALBUMIN 3.7 3.0* 3.0* 2.7* 2.8*   No results for input(s): LIPASE, AMYLASE in the last 168 hours.  Recent Labs Lab 02/07/16 1828  AMMONIA 41*   CBC:  Recent Labs Lab 02/07/16 1646 02/07/16 2234 02/08/16 0449 02/11/16 0443 02/13/16 0447  WBC 7.6  --  6.1 6.6 5.8  NEUTROABS  --   --  2.8 2.4  --   HGB 12.4  --  10.0* 9.4* 9.5*  HCT 34.9* 31.7* 27.6* 28.8* 28.6*  MCV 95.6   --  95.8 102.1* 102.5*  PLT 215  --  231 455* 459*   Cardiac Enzymes:  Recent Labs Lab 02/07/16 1646  CKTOTAL 92   BNP (last 3 results) No results for input(s): BNP in the last 8760 hours.  ProBNP (last 3 results) No results for input(s): PROBNP in the last 8760 hours.  CBG:  Recent Labs Lab 02/09/16 1044 02/10/16 0943 02/11/16 0714 02/12/16 0720 02/13/16 0742  GLUCAP 92 72 77 74 108*    Recent Results (from the past 240 hour(s))  Urine culture     Status: None   Collection Time: 02/07/16 12:27 PM  Result Value Ref Range Status   Specimen Description URINE, CLEAN CATCH  Final   Special Requests NONE  Final   Culture   Final    MULTIPLE SPECIES PRESENT, SUGGEST RECOLLECTION Performed at [facility]    Report Status 02/09/2016 FINAL  Final     Studies: No results found.  Scheduled Meds: . enoxaparin (LOVENOX) injection  40 mg Subcutaneous Q24H  .  escitalopram  10 mg Oral Daily  . ferrous sulfate  325 mg Oral TID WC  . folic acid  1 mg Oral Daily  . gabapentin  100 mg Oral QHS  . lactulose  20 g Oral BID  . LORazepam  0.5 mg Intramuscular Once  . mirtazapine  7.5 mg Oral QHS  . multivitamin with minerals  1 tablet Oral Daily  . thiamine IV  500 mg Intravenous TID   Continuous Infusions:   Principal Problem:   Wernicke encephalopathy Active Problems:   Depression   Alcohol dependence (HCC)   Severe recurrent major depression without psychotic features (HCC)   Neuropathy (HCC)   Hyponatremia   Transaminitis   Decubitus ulcer   Hypokalemia   Serum total bilirubin elevated   Hepatic steatosis   Acute encephalopathy   Hyperglycemia   Vitamin B1 deficiency neuropathy   H/O gastric bypass    Time spent: 23 minutes    THOMPSON,DANIEL M.D. [facility] Pager [phone]. If 7PM-7AM, please contact night-coverage at [email or web address], password [facility] 02/13/2016, 12:34 PM  LOS: 6 days

## 2016-02-14 LAB — BASIC METABOLIC PANEL
ANION GAP: 7 (ref 5–15)
BUN: <5 mg/dL — ABNORMAL LOW (ref 6–20)
CO2: 24 mmol/L (ref 22–32)
Calcium: 9 mg/dL (ref 8.9–10.3)
Chloride: 109 mmol/L (ref 101–111)
Creatinine, Ser: 0.56 mg/dL (ref 0.44–1.00)
GLUCOSE: 69 mg/dL (ref 65–99)
POTASSIUM: 3.6 mmol/L (ref 3.5–5.1)
Sodium: 140 mmol/L (ref 135–145)

## 2016-02-14 LAB — MAGNESIUM: Magnesium: 1.8 mg/dL (ref 1.7–2.4)

## 2016-02-14 LAB — GLUCOSE, CAPILLARY: GLUCOSE-CAPILLARY: 77 mg/dL (ref 65–99)

## 2016-02-14 MED ORDER — ENSURE ENLIVE PO LIQD
237.0000 mL | Freq: Two times a day (BID) | ORAL | Status: DC
  Administered 2016-02-14 – 2016-02-18 (×8): 237 mL via ORAL

## 2016-02-14 MED ORDER — THIAMINE HCL 100 MG/ML IJ SOLN
250.0000 mg | INTRAMUSCULAR | Status: AC
Start: 2016-02-15 — End: 2016-02-19
  Administered 2016-02-15 – 2016-02-19 (×5): 250 mg via INTRAVENOUS
  Filled 2016-02-14 (×5): qty 2.5

## 2016-02-14 NOTE — Progress Notes (Signed)
Nutrition Follow-up  DOCUMENTATION CODES:   Non-severe (moderate) malnutrition in context of chronic illness  INTERVENTION:   Continue Ensure Enlive po BID, each supplement provides 350 kcal and 20 grams of protein RD to continue to monitor  NUTRITION DIAGNOSIS:   Malnutrition related to chronic illness as evidenced by moderate depletion of body fat, moderate depletions of muscle mass.  Ongoing.  GOAL:   Patient will meet greater than or equal to 90% of their needs  Meeting.  MONITOR:   PO intake, I & O's, Labs, Supplement acceptance, Skin  ASSESSMENT:   Natalie Mcdaniel is a 50 y.o. female with PMH of morbid obesity status post Roux-en-Y bypass, chronic alcoholism with no known history of DT or seizures, depression, and anxiety who presents from home via EMS after she was found on the floor by a friend who was concern for the patient's inability to adequately care for herself  Patient eating well, 100% of regular diet. Receiving many vitamins and minerals. Has been ordered Ensure supplements for extra kcal and protein needed for wound healing. Will continue to monitor for further nutritional needs.  Labs reviewed. Medications: ferrous sulfate tablet TID, Folic acid tablet daily, Remeron daily, MVI daily  Diet Order:  Diet regular Room service appropriate?: Yes; Fluid consistency:: Thin  Skin:  Wound (see comment) (Bilateral pressure ulcers to buttocks.)  Last BM:  2/22  Height:   Ht Readings from Last 1 Encounters:  02/08/16 5' 6.5" (1.689 m)    Weight:   Wt Readings from Last 1 Encounters:  02/13/16 146 lb 13.2 oz (66.6 kg)    Ideal Body Weight:  59.09 kg  BMI:  Body mass index is 23.35 kg/(m^2).  Estimated Nutritional Needs:   Kcal:  1700-1900  Protein:  80-90g  Fluid:  2L/day  EDUCATION NEEDS:   No education needs identified at this time  Clayton Bibles, MS, RD, LDN Pager: [phone] After Hours Pager: [phone]

## 2016-02-14 NOTE — Progress Notes (Signed)
[facility] PROGRESS NOTE  Natalie Mcdaniel [id number] DOB: [birth year] DOA: 02/07/2016 PCP: JEFFERY,CHELLE, PA-C  Brief interval history As per the H and P dictated on admission, "Natalie Mcdaniel is a 50 y.o. female with PMH of morbid obesity status post Roux-en-Y bypass, chronic alcoholism with no known history of DT or seizures, depression, and anxiety who presents from home via EMS after she was found on the floor by a friend who was concern for the patient's inability to adequately care for herself. Patient's friend is at the bedside and assists with the history. Patient has a long history of chronic alcohol abuse, citing this as the reason she has not followed with her PCP or adhered to her treatment plan. She has been largely confined to her bed for at least 6 months, explaining that her peripheral neuropathy precludes her ability to ambulate. Over this interval, she has developed pressure sores over her buttock. Her friend at the bedside reportedly checks on her several times a week, finding the patient's house to be in disarray with trash throughout and foul odor. Patient's friend reportedly brings meals when she visits, but the patient otherwise does not eat. She had previously obtained a bachelor's degree and worked for [employer] before her alcohol dependence made this impossible. She notes that since she has lost her job, she has not had money to buy alcohol, and states that unless a friend brings her son, she has had to go without. Her friend agrees that she has been drinking much less than previously when she would consume approximately 24 12-ounce beers daily.  In ED, patient was found to be afebrile, saturating well on room air, and with vital signs stable. Chest x-ray was negative for acute cardiopulmonary disease and noncontrast head CT was negative for acute intracranial abnormality. EKG revealed a sinus rhythm with nonspecific ST changes in the inferior leads. Basic lab work was  obtained and CMP returns with sodium of 128, potassium 2.7, moderate elevations in transaminases, and a bilirubin of 5.1. CBC is unremarkable. Ethanol and troponin levels are undetectable, and ammonia returned slightly elevated to 41. Given the elevated transaminases and bilirubin, a contrast-enhanced CT of the abdomen was obtained and demonstrative of mild hepatic steatosis. Patient was bolused with 1 L normal saline in the emergency department, given 1 g of magnesium, 10 mEq IV potassium, and a single dose of Norco 5 mg. She remained hemodynamically stable in the emergency department and will be admitted for ongoing evaluation and management of encephalopathy with widespread electrolyte derangements."    Assessment/Plan: #1 Wernicke encephalopathy It was noted that patient was brought in by a friend that she was unable to care for herself and living alone. Patient did not have any asterixis. Ammonia level was minimally elevated on admission. MRI of the head was negative for any acute infarct. Patient was noted to have a vitamin B-1 deficiency with some nystagmus as well as ataxia as documented from evaluation on admission and as per PT. Patient was meeting criteria for Wernicke encephalopathy. Continue IV vitamin B-1, IV thiamine. Patient has been seen in consultation by psychiatry who recommended to continue IVC and patient did not have capacity to make her own decisions. Patient likely needs placement as patient lives alone.  #2 alcohol abuse/vitamin B-1 deficiency Continue the Ativan withdrawal protocol. Continue vitamin B-1 replacement.  #3 hypokalemia/hypomagnesemia Repleted.  #4 constipation Continue lactulose.  #5 pressure ulcers Patient with pressure ulcers noted on buttocks. Frequent turning. Monitor.   Code Status: Full  Family Communication: Updated patient and daughter at bedside. Disposition Plan: Patient will likely need placement.   Consultants:  Psychiatry: Dr.  Louretta Shorten 02/09/2016  Procedures:  CT abdomen and pelvis 02/07/2016,  CT head 02/07/2016  Chest x-ray 02/07/2016  MRI head 02/10/2016  Antibiotics:  None  HPI/Subjective: Patient denies chest pain. No shortness of breath. Patient states she went home yesterday and came back to the hospital.  Objective: Filed Vitals:   02/13/16 2140 02/14/16 0434  BP: 113/65 121/71  Pulse: 70 75  Temp: 98.6 F (37 C) 98.7 F (37.1 C)  Resp: 16 16    Intake/Output Summary (Last 24 hours) at 02/14/16 1409 Last data filed at 02/14/16 1200  Gross per 24 hour  Intake    700 ml  Output      0 ml  Net    700 ml   Filed Weights   02/08/16 0311 02/09/16 0707 02/13/16 0550  Weight: 65.7 kg (144 lb 13.5 oz) 69.4 kg (153 lb) 66.6 kg (146 lb 13.2 oz)    Exam:   General:  NAD  Cardiovascular: RRR  Respiratory: CTAB  Abdomen: Soft, nontender, nondistended, positive bowel sounds.  Musculoskeletal: No clubbing cyanosis or edema.  Data Reviewed: Basic Metabolic Panel:  Recent Labs Lab 02/09/16 0733 02/11/16 0443 02/12/16 0451 02/13/16 0447 02/14/16 0436  NA 135 137 140 142 140  K 3.4* 3.5 3.5 3.6 3.6  CL 103 108 112* 110 109  CO2 24 23 22 24 24   GLUCOSE 110* 87 100* 80 69  BUN <5* 7 <5* <5* <5*  CREATININE 0.43* 0.48 0.42* 0.48 0.56  CALCIUM 9.0 9.0 9.2 9.1 9.0  MG 1.5* 1.8 1.9 1.9 1.8   Liver Function Tests:  Recent Labs Lab 02/07/16 1646 02/08/16 0433 02/09/16 0733 02/11/16 0443 02/13/16 0447  AST 209* 131* 91* 48* 34  ALT 121* 89* 78* 54 38  ALKPHOS 236* 175* 154* 126 111  BILITOT 5.1* 3.6* 2.5* 1.7* 1.2  PROT 7.7 6.3* 6.2* 5.7* 5.7*  ALBUMIN 3.7 3.0* 3.0* 2.7* 2.8*   No results for input(s): LIPASE, AMYLASE in the last 168 hours.  Recent Labs Lab 02/07/16 1828  AMMONIA 41*   CBC:  Recent Labs Lab 02/07/16 1646 02/07/16 2234 02/08/16 0449 02/11/16 0443 02/13/16 0447  WBC 7.6  --  6.1 6.6 5.8  NEUTROABS  --   --  2.8 2.4  --   HGB 12.4  --   10.0* 9.4* 9.5*  HCT 34.9* 31.7* 27.6* 28.8* 28.6*  MCV 95.6  --  95.8 102.1* 102.5*  PLT 215  --  231 455* 459*   Cardiac Enzymes:  Recent Labs Lab 02/07/16 1646  CKTOTAL 92   BNP (last 3 results) No results for input(s): BNP in the last 8760 hours.  ProBNP (last 3 results) No results for input(s): PROBNP in the last 8760 hours.  CBG:  Recent Labs Lab 02/10/16 0943 02/11/16 0714 02/12/16 0720 02/13/16 0742 02/14/16 0707  GLUCAP 72 77 74 108* 77    Recent Results (from the past 240 hour(s))  Urine culture     Status: None   Collection Time: 02/07/16 12:27 PM  Result Value Ref Range Status   Specimen Description URINE, CLEAN CATCH  Final   Special Requests NONE  Final   Culture   Final    MULTIPLE SPECIES PRESENT, SUGGEST RECOLLECTION Performed at [facility] [place] [facility]    Report Status 02/09/2016 FINAL  Final     Studies: No results found.  Scheduled  Meds: . enoxaparin (LOVENOX) injection  40 mg Subcutaneous Q24H  . escitalopram  10 mg Oral Daily  . feeding supplement (ENSURE ENLIVE)  237 mL Oral BID BM  . ferrous sulfate  325 mg Oral TID WC  . folic acid  1 mg Oral Daily  . gabapentin  100 mg Oral QHS  . lactulose  20 g Oral BID  . LORazepam  0.5 mg Intramuscular Once  . mirtazapine  7.5 mg Oral QHS  . multivitamin with minerals  1 tablet Oral Daily  . [START ON 02/15/2016] thiamine IV  250 mg Intravenous Q24H   Continuous Infusions:   Principal Problem:   Wernicke encephalopathy Active Problems:   Depression   Alcohol dependence (HCC)   Severe recurrent major depression without psychotic features (HCC)   Neuropathy (HCC)   Hyponatremia   Transaminitis   Decubitus ulcer   Hypokalemia   Serum total bilirubin elevated   Hepatic steatosis   Acute encephalopathy   Hyperglycemia   Vitamin B1 deficiency neuropathy   H/O gastric bypass   Constipation    Time spent: 40 minutes    Heiress Williamson M.D. [facility] Pager [phone]. If  7PM-7AM, please contact night-coverage at [email or web address], password [facility] 02/14/2016, 2:09 PM  LOS: 7 days

## 2016-02-15 LAB — GLUCOSE, CAPILLARY: GLUCOSE-CAPILLARY: 85 mg/dL (ref 65–99)

## 2016-02-15 NOTE — Care Management Note (Signed)
Case Management Note  Patient Details  Name: Natalie Mcdaniel MRN: [id number] Date of Birth: 12-08-1966  Subjective/Objective:      Admitted with hepatic encephalopathy, electrolyte imbalance                Action/Plan: Discharge planning, spoke with daughter, Candice on the phone. She is trying to make arrangements to get her mother to [place] next week. She is unable to take her mother earlier due to work conflicts and no one to care for her at home, daughter lives 3hrs away. Discussed case with CSW and Medical Director, patient is still receiving IV thiamine and will continue per attending d/t malabsorption issues. Per discussion, patient will remain in house until completion of IV thiamine, target 12/28. Daughter is making arrangements to get patient to [place] with other family members at that time. Daughter is in agreement with plan and will be here Tuesday to get patient.  Expected Discharge Date:   (unknown)               Expected Discharge Plan:  Skilled Nursing Facility  In-House Referral:  Clinical Social Work  Discharge planning Services  CM Consult  Post Acute Care Choice:  NA Choice offered to:  NA  DME Arranged:  N/A DME Agency:  NA  HH Arranged:  NA HH Agency:  NA  Status of Service:  Completed, signed off  Medicare Important Message Given:    Date Medicare IM Given:    Medicare IM give by:    Date Additional Medicare IM Given:    Additional Medicare Important Message give by:     If discussed at [place] of Stay Meetings, dates discussed:    Additional Comments:  Guadalupe Maple, RN 02/15/2016, 10:58 AM

## 2016-02-15 NOTE — Progress Notes (Signed)
[facility] PROGRESS NOTE  Natalie Mcdaniel [id number] DOB: 1966-05-03 DOA: 02/07/2016 PCP: JEFFERY,CHELLE, PA-C  Brief interval history As per the H and P dictated on admission, "Natalie Mcdaniel is a 50 y.o. female with PMH of morbid obesity status post Roux-en-Y bypass, chronic alcoholism with no known history of DT or seizures, depression, and anxiety who presents from home via EMS after she was found on the floor by a friend who was concern for the patient's inability to adequately care for herself. Patient's friend is at the bedside and assists with the history. Patient has a long history of chronic alcohol abuse, citing this as the reason she has not followed with her PCP or adhered to her treatment plan. She has been largely confined to her bed for at least 6 months, explaining that her peripheral neuropathy precludes her ability to ambulate. Over this interval, she has developed pressure sores over her buttock. Her friend at the bedside reportedly checks on her several times a week, finding the patient's house to be in disarray with trash throughout and foul odor. Patient's friend reportedly brings meals when she visits, but the patient otherwise does not eat. She had previously obtained a bachelor's degree and worked for [employer] before her alcohol dependence made this impossible. She notes that since she has lost her job, she has not had money to buy alcohol, and states that unless a friend brings her son, she has had to go without. Her friend agrees that she has been drinking much less than previously when she would consume approximately 24 12-ounce beers daily.  In ED, patient was found to be afebrile, saturating well on room air, and with vital signs stable. Chest x-ray was negative for acute cardiopulmonary disease and noncontrast head CT was negative for acute intracranial abnormality. EKG revealed a sinus rhythm with nonspecific ST changes in the inferior leads. Basic lab work was  obtained and CMP returns with sodium of 128, potassium 2.7, moderate elevations in transaminases, and a bilirubin of 5.1. CBC is unremarkable. Ethanol and troponin levels are undetectable, and ammonia returned slightly elevated to 41. Given the elevated transaminases and bilirubin, a contrast-enhanced CT of the abdomen was obtained and demonstrative of mild hepatic steatosis. Patient was bolused with 1 L normal saline in the emergency department, given 1 g of magnesium, 10 mEq IV potassium, and a single dose of Norco 5 mg. She remained hemodynamically stable in the emergency department and will be admitted for ongoing evaluation and management of encephalopathy with widespread electrolyte derangements."    Assessment/Plan: #1 Wernicke encephalopathy It was noted that patient was brought in by a friend that she was unable to care for herself and living alone. Patient did not have any asterixis. Ammonia level was minimally elevated on admission. MRI of the head was negative for any acute infarct. Patient was noted to have a vitamin B-1 deficiency with some nystagmus as well as ataxia as documented from evaluation on admission and as per PT. Patient was meeting criteria for Wernicke encephalopathy. Continue IV vitamin B-1, IV thiamine. Patient has been seen in consultation by psychiatry who recommended to continue IVC and patient did not have capacity to make her own decisions. Patient likely needs placement as patient lives alone.  #2 alcohol abuse/vitamin B-1 deficiency Continue the Ativan withdrawal protocol. Continue vitamin B-1 replacement.  #3 hypokalemia/hypomagnesemia Repleted.  #4 constipation Continue lactulose.  #5 pressure ulcers Patient with pressure ulcers noted on buttocks. Frequent turning. Monitor.   Code Status: Full  Family Communication: Updated patient. No family at bedside. Disposition Plan: Patient will likely need placement.   Consultants:  Psychiatry: Dr. Louretta Shorten  02/09/2016  Procedures:  CT abdomen and pelvis 02/07/2016,  CT head 02/07/2016  Chest x-ray 02/07/2016  MRI head 02/10/2016  Antibiotics:  None  HPI/Subjective: Patient denies chest pain. No shortness of breath. Patient states she went home 2 days ago and came back to the hospital.  Objective: Filed Vitals:   02/14/16 2104 02/15/16 0605  BP: 109/70 107/66  Pulse: 78 66  Temp: 98.6 F (37 C) 99.3 F (37.4 C)  Resp: 16 18    Intake/Output Summary (Last 24 hours) at 02/15/16 1402 Last data filed at 02/15/16 [id number]  Gross per 24 hour  Intake    240 ml  Output      0 ml  Net    240 ml   Filed Weights   02/09/16 0707 02/13/16 0550 02/15/16 0605  Weight: 69.4 kg (153 lb) 66.6 kg (146 lb 13.2 oz) 65.6 kg (144 lb 10 oz)    Exam:   General:  NAD  Cardiovascular: RRR  Respiratory: CTAB  Abdomen: Soft, nontender, nondistended, positive bowel sounds.  Musculoskeletal: No clubbing cyanosis or edema.  Data Reviewed: Basic Metabolic Panel:  Recent Labs Lab 02/09/16 0733 02/11/16 0443 02/12/16 0451 02/13/16 0447 02/14/16 0436  NA 135 137 140 142 140  K 3.4* 3.5 3.5 3.6 3.6  CL 103 108 112* 110 109  CO2 24 23 22 24 24   GLUCOSE 110* 87 100* 80 69  BUN <5* 7 <5* <5* <5*  CREATININE 0.43* 0.48 0.42* 0.48 0.56  CALCIUM 9.0 9.0 9.2 9.1 9.0  MG 1.5* 1.8 1.9 1.9 1.8   Liver Function Tests:  Recent Labs Lab 02/09/16 0733 02/11/16 0443 02/13/16 0447  AST 91* 48* 34  ALT 78* 54 38  ALKPHOS 154* 126 111  BILITOT 2.5* 1.7* 1.2  PROT 6.2* 5.7* 5.7*  ALBUMIN 3.0* 2.7* 2.8*   No results for input(s): LIPASE, AMYLASE in the last 168 hours. No results for input(s): AMMONIA in the last 168 hours. CBC:  Recent Labs Lab 02/11/16 0443 02/13/16 0447  WBC 6.6 5.8  NEUTROABS 2.4  --   HGB 9.4* 9.5*  HCT 28.8* 28.6*  MCV 102.1* 102.5*  PLT 455* 459*   Cardiac Enzymes: No results for input(s): CKTOTAL, CKMB, CKMBINDEX, TROPONINI in the last 168 hours. BNP  (last 3 results) No results for input(s): BNP in the last 8760 hours.  ProBNP (last 3 results) No results for input(s): PROBNP in the last 8760 hours.  CBG:  Recent Labs Lab 02/11/16 0714 02/12/16 0720 02/13/16 0742 02/14/16 0707 02/15/16 0750  GLUCAP 77 74 108* 77 85    Recent Results (from the past 240 hour(s))  Urine culture     Status: None   Collection Time: 02/07/16 12:27 PM  Result Value Ref Range Status   Specimen Description URINE, CLEAN CATCH  Final   Special Requests NONE  Final   Culture   Final    MULTIPLE SPECIES PRESENT, SUGGEST RECOLLECTION Performed at [facility]    Report Status 02/09/2016 FINAL  Final     Studies: No results found.  Scheduled Meds: . enoxaparin (LOVENOX) injection  40 mg Subcutaneous Q24H  . escitalopram  10 mg Oral Daily  . feeding supplement (ENSURE ENLIVE)  237 mL Oral BID BM  . ferrous sulfate  325 mg Oral TID WC  . folic acid  1 mg Oral Daily  .  gabapentin  100 mg Oral QHS  . lactulose  20 g Oral BID  . LORazepam  0.5 mg Intramuscular Once  . mirtazapine  7.5 mg Oral QHS  . multivitamin with minerals  1 tablet Oral Daily  . thiamine IV  250 mg Intravenous Q24H   Continuous Infusions:   Principal Problem:   Wernicke encephalopathy Active Problems:   Depression   Alcohol dependence (HCC)   Severe recurrent major depression without psychotic features (HCC)   Neuropathy (HCC)   Hyponatremia   Transaminitis   Decubitus ulcer   Hypokalemia   Serum total bilirubin elevated   Hepatic steatosis   Acute encephalopathy   Hyperglycemia   Vitamin B1 deficiency neuropathy   H/O gastric bypass   Constipation    Time spent: 74 minutes    Evan Osburn M.D. [facility] Pager [phone]. If 7PM-7AM, please contact night-coverage at [email or web address], password [facility] 02/15/2016, 2:02 PM  LOS: 8 days

## 2016-02-15 NOTE — Progress Notes (Signed)
Patient in bed resting; sitter is in room.

## 2016-02-16 LAB — BASIC METABOLIC PANEL
ANION GAP: 7 (ref 5–15)
BUN: 8 mg/dL (ref 6–20)
CHLORIDE: 106 mmol/L (ref 101–111)
CO2: 26 mmol/L (ref 22–32)
Calcium: 9 mg/dL (ref 8.9–10.3)
Creatinine, Ser: 0.58 mg/dL (ref 0.44–1.00)
GFR calc Af Amer: >60 mL/min (ref >60)
GLUCOSE: 68 mg/dL (ref 65–99)
POTASSIUM: 3.7 mmol/L (ref 3.5–5.1)
Sodium: 139 mmol/L (ref 135–145)

## 2016-02-16 LAB — MAGNESIUM: Magnesium: 1.8 mg/dL (ref 1.7–2.4)

## 2016-02-16 LAB — GLUCOSE, CAPILLARY: Glucose-Capillary: 84 mg/dL (ref 65–99)

## 2016-02-16 NOTE — Progress Notes (Signed)
[facility] PROGRESS NOTE  DANIJA SEDBERRY [id number] DOB: 04-27-1966 DOA: 02/07/2016 PCP: JEFFERY,CHELLE, PA-C  Brief interval history As per the H and P dictated on admission, "Natalie Mcdaniel is a 50 y.o. female with PMH of morbid obesity status post Roux-en-Y bypass, chronic alcoholism with no known history of DT or seizures, depression, and anxiety who presents from home via EMS after she was found on the floor by a friend who was concern for the patient's inability to adequately care for herself. Patient's friend is at the bedside and assists with the history. Patient has a long history of chronic alcohol abuse, citing this as the reason she has not followed with her PCP or adhered to her treatment plan. She has been largely confined to her bed for at least 6 months, explaining that her peripheral neuropathy precludes her ability to ambulate. Over this interval, she has developed pressure sores over her buttock. Her friend at the bedside reportedly checks on her several times a week, finding the patient's house to be in disarray with trash throughout and foul odor. Patient's friend reportedly brings meals when she visits, but the patient otherwise does not eat. She had previously obtained a bachelor's degree and worked for [employer] before her alcohol dependence made this impossible. She notes that since she has lost her job, she has not had money to buy alcohol, and states that unless a friend brings her son, she has had to go without. Her friend agrees that she has been drinking much less than previously when she would consume approximately 24 12-ounce beers daily.  In ED, patient was found to be afebrile, saturating well on room air, and with vital signs stable. Chest x-ray was negative for acute cardiopulmonary disease and noncontrast head CT was negative for acute intracranial abnormality. EKG revealed a sinus rhythm with nonspecific ST changes in the inferior leads. Basic lab work was  obtained and CMP returns with sodium of 128, potassium 2.7, moderate elevations in transaminases, and a bilirubin of 5.1. CBC is unremarkable. Ethanol and troponin levels are undetectable, and ammonia returned slightly elevated to 41. Given the elevated transaminases and bilirubin, a contrast-enhanced CT of the abdomen was obtained and demonstrative of mild hepatic steatosis. Patient was bolused with 1 L normal saline in the emergency department, given 1 g of magnesium, 10 mEq IV potassium, and a single dose of Norco 5 mg. She remained hemodynamically stable in the emergency department and will be admitted for ongoing evaluation and management of encephalopathy with widespread electrolyte derangements."    Assessment/Plan: #1 Wernicke encephalopathy It was noted that patient was brought in by a friend that she was unable to care for herself and living alone. Patient did not have any asterixis. Ammonia level was minimally elevated on admission. MRI of the head was negative for any acute infarct. Patient was noted to have a vitamin B-1 deficiency with some nystagmus as well as ataxia as documented from evaluation on admission and as per PT. Patient was meeting criteria for Wernicke encephalopathy. Continue IV vitamin B-1, IV thiamine. Patient has been seen in consultation by psychiatry who recommended to continue IVC and patient did not have capacity to make her own decisions. Patient likely needs placement as patient lives alone.  #2 alcohol abuse/vitamin B-1 deficiency Continue the Ativan withdrawal protocol. Continue vitamin B-1 replacement.  #3 hypokalemia/hypomagnesemia Repleted.  #4 constipation Continue lactulose.  #5 pressure ulcers Patient with pressure ulcers noted on buttocks. Frequent turning. Monitor.   Code Status: Full  Family Communication: Updated patient. No family at bedside. Disposition Plan: Patient will likely need placement.   Consultants:  Psychiatry: Dr. Louretta Shorten  02/09/2016  Procedures:  CT abdomen and pelvis 02/07/2016,  CT head 02/07/2016  Chest x-ray 02/07/2016  MRI head 02/10/2016  Antibiotics:  None  HPI/Subjective: Patient denies chest pain. No shortness of breath.   Objective: Filed Vitals:   02/16/16 0619 02/16/16 1341  BP: 111/69 120/65  Pulse: 67 94  Temp: 98.9 F (37.2 C) 97.9 F (36.6 C)  Resp: 18 18    Intake/Output Summary (Last 24 hours) at 02/16/16 1646 Last data filed at 02/16/16 1516  Gross per 24 hour  Intake    960 ml  Output      0 ml  Net    960 ml   Filed Weights   02/15/16 0605 02/16/16 0615 02/16/16 0731  Weight: 65.6 kg (144 lb 10 oz) 66.044 kg (145 lb 9.6 oz) 66 kg (145 lb 8.1 oz)    Exam:   General:  NAD  Cardiovascular: RRR  Respiratory: CTAB  Abdomen: Soft, nontender, nondistended, positive bowel sounds.  Musculoskeletal: No clubbing cyanosis or edema.  Data Reviewed: Basic Metabolic Panel:  Recent Labs Lab 02/11/16 0443 02/12/16 0451 02/13/16 0447 02/14/16 0436 02/16/16 0442  NA 137 140 142 140 139  K 3.5 3.5 3.6 3.6 3.7  CL 108 112* 110 109 106  CO2 23 22 24 24 26   GLUCOSE 87 100* 80 69 68  BUN 7 <5* <5* <5* 8  CREATININE 0.48 0.42* 0.48 0.56 0.58  CALCIUM 9.0 9.2 9.1 9.0 9.0  MG 1.8 1.9 1.9 1.8 1.8   Liver Function Tests:  Recent Labs Lab 02/11/16 0443 02/13/16 0447  AST 48* 34  ALT 54 38  ALKPHOS 126 111  BILITOT 1.7* 1.2  PROT 5.7* 5.7*  ALBUMIN 2.7* 2.8*   No results for input(s): LIPASE, AMYLASE in the last 168 hours. No results for input(s): AMMONIA in the last 168 hours. CBC:  Recent Labs Lab 02/11/16 0443 02/13/16 0447  WBC 6.6 5.8  NEUTROABS 2.4  --   HGB 9.4* 9.5*  HCT 28.8* 28.6*  MCV 102.1* 102.5*  PLT 455* 459*   Cardiac Enzymes: No results for input(s): CKTOTAL, CKMB, CKMBINDEX, TROPONINI in the last 168 hours. BNP (last 3 results) No results for input(s): BNP in the last 8760 hours.  ProBNP (last 3 results) No results for  input(s): PROBNP in the last 8760 hours.  CBG:  Recent Labs Lab 02/12/16 0720 02/13/16 0742 02/14/16 0707 02/15/16 0750 02/16/16 0747  GLUCAP 74 108* 77 85 84    Recent Results (from the past 240 hour(s))  Urine culture     Status: None   Collection Time: 02/07/16 12:27 PM  Result Value Ref Range Status   Specimen Description URINE, CLEAN CATCH  Final   Special Requests NONE  Final   Culture   Final    MULTIPLE SPECIES PRESENT, SUGGEST RECOLLECTION Performed at [facility]    Report Status 02/09/2016 FINAL  Final     Studies: No results found.  Scheduled Meds: . enoxaparin (LOVENOX) injection  40 mg Subcutaneous Q24H  . escitalopram  10 mg Oral Daily  . feeding supplement (ENSURE ENLIVE)  237 mL Oral BID BM  . ferrous sulfate  325 mg Oral TID WC  . folic acid  1 mg Oral Daily  . gabapentin  100 mg Oral QHS  . lactulose  20 g Oral BID  . LORazepam  0.5 mg Intramuscular Once  . mirtazapine  7.5 mg Oral QHS  . multivitamin with minerals  1 tablet Oral Daily  . thiamine IV  250 mg Intravenous Q24H   Continuous Infusions:   Principal Problem:   Wernicke encephalopathy Active Problems:   Depression   Alcohol dependence (HCC)   Severe recurrent major depression without psychotic features (HCC)   Neuropathy (HCC)   Hyponatremia   Transaminitis   Decubitus ulcer   Hypokalemia   Serum total bilirubin elevated   Hepatic steatosis   Acute encephalopathy   Hyperglycemia   Vitamin B1 deficiency neuropathy   H/O gastric bypass   Constipation    Time spent: 40 minutes    Korey Arroyo M.D. [facility] Pager [phone]. If 7PM-7AM, please contact night-coverage at [email or web address], password [facility] 02/16/2016, 4:46 PM  LOS: 9 days

## 2016-02-17 NOTE — Progress Notes (Signed)
Pt quiet all night. Cooperative, took meds, slept all night.  IVC  Expired on 2/22. Text pt provider on call, advised that since she is no longer IVC's, we can d/c sitter.  Will contine to monitor.

## 2016-02-17 NOTE — Progress Notes (Signed)
[facility] PROGRESS NOTE  Natalie Mcdaniel [id number] DOB: September 22, 1966 DOA: 02/07/2016 PCP: JEFFERY,CHELLE, PA-C  Brief interval history As per the H and P dictated on admission, "Natalie Mcdaniel is a 50 y.o. female with PMH of morbid obesity status post Roux-en-Y bypass, chronic alcoholism with no known history of DT or seizures, depression, and anxiety who presents from home via EMS after she was found on the floor by a friend who was concern for the patient's inability to adequately care for herself. Patient's friend is at the bedside and assists with the history. Patient has a long history of chronic alcohol abuse, citing this as the reason she has not followed with her PCP or adhered to her treatment plan. She has been largely confined to her bed for at least 6 months, explaining that her peripheral neuropathy precludes her ability to ambulate. Over this interval, she has developed pressure sores over her buttock. Her friend at the bedside reportedly checks on her several times a week, finding the patient's house to be in disarray with trash throughout and foul odor. Patient's friend reportedly brings meals when she visits, but the patient otherwise does not eat. She had previously obtained a bachelor's degree and worked for [employer] before her alcohol dependence made this impossible. She notes that since she has lost her job, she has not had money to buy alcohol, and states that unless a friend brings her son, she has had to go without. Her friend agrees that she has been drinking much less than previously when she would consume approximately 24 12-ounce beers daily.  In ED, patient was found to be afebrile, saturating well on room air, and with vital signs stable. Chest x-ray was negative for acute cardiopulmonary disease and noncontrast head CT was negative for acute intracranial abnormality. EKG revealed a sinus rhythm with nonspecific ST changes in the inferior leads. Basic lab work was  obtained and CMP returns with sodium of 128, potassium 2.7, moderate elevations in transaminases, and a bilirubin of 5.1. CBC is unremarkable. Ethanol and troponin levels are undetectable, and ammonia returned slightly elevated to 41. Given the elevated transaminases and bilirubin, a contrast-enhanced CT of the abdomen was obtained and demonstrative of mild hepatic steatosis. Patient was bolused with 1 L normal saline in the emergency department, given 1 g of magnesium, 10 mEq IV potassium, and a single dose of Norco 5 mg. She remained hemodynamically stable in the emergency department and will be admitted for ongoing evaluation and management of encephalopathy with widespread electrolyte derangements."    Assessment/Plan: #1 Wernicke encephalopathy It was noted that patient was brought in by a friend that she was unable to care for herself and living alone. Patient did not have any asterixis. Ammonia level was minimally elevated on admission. MRI of the head was negative for any acute infarct. Patient was noted to have a vitamin B-1 deficiency with some nystagmus as well as ataxia as documented from evaluation on admission and as per PT. Patient was meeting criteria for Wernicke encephalopathy. Continue IV vitamin B-1, IV thiamine. Patient has been seen in consultation by psychiatry who recommended to continue IVC and patient did not have capacity to make her own decisions. Patient likely needs placement as patient lives alone.  #2 alcohol abuse/vitamin B-1 deficiency Continue the Ativan withdrawal protocol. Continue vitamin B-1 replacement.  #3 hypokalemia/hypomagnesemia Repleted.  #4 constipation Continue lactulose.  #5 pressure ulcers Patient with pressure ulcers noted on buttocks. Frequent turning. Monitor.   Code Status: Full  Family Communication: Updated patient. No family at bedside. Disposition Plan: Patient will likely need placement.   Consultants:  Psychiatry: Dr. Louretta Shorten  02/09/2016  Procedures:  CT abdomen and pelvis 02/07/2016,  CT head 02/07/2016  Chest x-ray 02/07/2016  MRI head 02/10/2016  Antibiotics:  None  HPI/Subjective: No shortness of breath. No chest pain. Tolerating oral intake.  Objective: Filed Vitals:   02/16/16 2026 02/17/16 0606  BP: 109/82 100/84  Pulse: 81 73  Temp: 98.8 F (37.1 C) 98.7 F (37.1 C)  Resp: 18 18    Intake/Output Summary (Last 24 hours) at 02/17/16 1537 Last data filed at 02/16/16 1800  Gross per 24 hour  Intake    237 ml  Output      0 ml  Net    237 ml   Filed Weights   02/15/16 0605 02/16/16 0615 02/16/16 0731  Weight: 65.6 kg (144 lb 10 oz) 66.044 kg (145 lb 9.6 oz) 66 kg (145 lb 8.1 oz)    Exam:   General:  NAD  Cardiovascular: RRR  Respiratory: CTAB  Abdomen: Soft, nontender, nondistended, positive bowel sounds.  Musculoskeletal: No clubbing cyanosis or edema.  Data Reviewed: Basic Metabolic Panel:  Recent Labs Lab 02/11/16 0443 02/12/16 0451 02/13/16 0447 02/14/16 0436 02/16/16 0442  NA 137 140 142 140 139  K 3.5 3.5 3.6 3.6 3.7  CL 108 112* 110 109 106  CO2 23 22 24 24 26   GLUCOSE 87 100* 80 69 68  BUN 7 <5* <5* <5* 8  CREATININE 0.48 0.42* 0.48 0.56 0.58  CALCIUM 9.0 9.2 9.1 9.0 9.0  MG 1.8 1.9 1.9 1.8 1.8   Liver Function Tests:  Recent Labs Lab 02/11/16 0443 02/13/16 0447  AST 48* 34  ALT 54 38  ALKPHOS 126 111  BILITOT 1.7* 1.2  PROT 5.7* 5.7*  ALBUMIN 2.7* 2.8*   No results for input(s): LIPASE, AMYLASE in the last 168 hours. No results for input(s): AMMONIA in the last 168 hours. CBC:  Recent Labs Lab 02/11/16 0443 02/13/16 0447  WBC 6.6 5.8  NEUTROABS 2.4  --   HGB 9.4* 9.5*  HCT 28.8* 28.6*  MCV 102.1* 102.5*  PLT 455* 459*   Cardiac Enzymes: No results for input(s): CKTOTAL, CKMB, CKMBINDEX, TROPONINI in the last 168 hours. BNP (last 3 results) No results for input(s): BNP in the last 8760 hours.  ProBNP (last 3 results) No  results for input(s): PROBNP in the last 8760 hours.  CBG:  Recent Labs Lab 02/12/16 0720 02/13/16 0742 02/14/16 0707 02/15/16 0750 02/16/16 0747  GLUCAP 74 108* 77 85 84    No results found for this or any previous visit (from the past 240 hour(s)).   Studies: No results found.  Scheduled Meds: . enoxaparin (LOVENOX) injection  40 mg Subcutaneous Q24H  . escitalopram  10 mg Oral Daily  . feeding supplement (ENSURE ENLIVE)  237 mL Oral BID BM  . ferrous sulfate  325 mg Oral TID WC  . folic acid  1 mg Oral Daily  . gabapentin  100 mg Oral QHS  . lactulose  20 g Oral BID  . LORazepam  0.5 mg Intramuscular Once  . mirtazapine  7.5 mg Oral QHS  . multivitamin with minerals  1 tablet Oral Daily  . thiamine IV  250 mg Intravenous Q24H   Continuous Infusions:   Principal Problem:   Wernicke encephalopathy Active Problems:   Depression   Alcohol dependence (HCC)   Severe recurrent major depression  without psychotic features ([place])   Neuropathy ([place])   Hyponatremia   Transaminitis   Decubitus ulcer   Hypokalemia   Serum total bilirubin elevated   Hepatic steatosis   Acute encephalopathy   Hyperglycemia   Vitamin B1 deficiency neuropathy   H/O gastric bypass   Constipation    Time spent: 49 minutes    Manville Rico M.D. [facility] Pager [phone]. If 7PM-7AM, please contact night-coverage at [email or web address], password [facility] 02/17/2016, 3:37 PM  LOS: 10 days

## 2016-02-18 LAB — GLUCOSE, CAPILLARY
GLUCOSE-CAPILLARY: 89 mg/dL (ref 65–99)
Glucose-Capillary: 95 mg/dL (ref 65–99)

## 2016-02-18 NOTE — Progress Notes (Signed)
   02/18/16 1200  Clinical Encounter Type  Visited With Patient and family together  Visit Type Initial;Psychological support;Spiritual support;Other (Comment)  Referral From Nurse;Social work  Consult/Referral To Chaplain  Spiritual Encounters  Spiritual Needs Other (Comment)  Stress Factors  Patient Stress Factors Financial concerns;Health changes;Other (Comment) (Living arrangements)  Family Stress Factors Financial concerns;Other (Comment) (Living arrangements/financial documents )   I visited with the patient and her daughter per request by the social worker and nurse. They stated that the patient and her daughter needed clarification about what documents that we can notarize in the hospital.  When I went into the room the patient was lying in bed and her daughter was at the bedside. The daughter was visibly and verbally upset that staff have stated that her mother lacks capacity to sign any documents; and that they have already told her that we cannot notarize any documents that are not health care related.  I reaffirmed the information that was given by the care coordinator and social worker.  The patient stated that her house was being forelosed and that she needed to get her belongings to take with her to [place] to live with her mother. The daughter lives 3 hours away and was frustrated that we couldn't have the documents notarized today.  The patient stated that she had capacity. She also told me that she fell in the bathroom today and broke her tooth off. She reached in her purse to show me the tooth, but never produced the tooth. I notified staff who stated that they would check on the patient.     [place] M.Div.

## 2016-02-18 NOTE — Progress Notes (Signed)
[facility] PROGRESS NOTE  [name] [id number] DOB: 04/11/1966 DOA: 02/07/2016 PCP: JEFFERY,CHELLE, PA-C  Brief interval history As per the H and P dictated on admission, "Natalie Mcdaniel is a 50 y.o. female with PMH of morbid obesity status post Roux-en-Y bypass, chronic alcoholism with no known history of DT or seizures, depression, and anxiety who presents from home via EMS after she was found on the floor by a friend who was concern for the patient's inability to adequately care for herself. Patient's friend is at the bedside and assists with the history. Patient has a long history of chronic alcohol abuse, citing this as the reason she has not followed with her PCP or adhered to her treatment plan. She has been largely confined to her bed for at least 6 months, explaining that her peripheral neuropathy precludes her ability to ambulate. Over this interval, she has developed pressure sores over her buttock. Her friend at the bedside reportedly checks on her several times a week, finding the patient's house to be in disarray with trash throughout and foul odor. Patient's friend reportedly brings meals when she visits, but the patient otherwise does not eat. She had previously obtained a bachelor's degree and worked for [employer] before her alcohol dependence made this impossible. She notes that since she has lost her job, she has not had money to buy alcohol, and states that unless a friend brings her son, she has had to go without. Her friend agrees that she has been drinking much less than previously when she would consume approximately 24 12-ounce beers daily.  In ED, patient was found to be afebrile, saturating well on room air, and with vital signs stable. Chest x-ray was negative for acute cardiopulmonary disease and noncontrast head CT was negative for acute intracranial abnormality. EKG revealed a sinus rhythm with nonspecific ST changes in the inferior leads. Basic lab work was  obtained and CMP returns with sodium of 128, potassium 2.7, moderate elevations in transaminases, and a bilirubin of 5.1. CBC is unremarkable. Ethanol and troponin levels are undetectable, and ammonia returned slightly elevated to 41. Given the elevated transaminases and bilirubin, a contrast-enhanced CT of the abdomen was obtained and demonstrative of mild hepatic steatosis. Patient was bolused with 1 L normal saline in the emergency department, given 1 g of magnesium, 10 mEq IV potassium, and a single dose of Norco 5 mg. She remained hemodynamically stable in the emergency department and will be admitted for ongoing evaluation and management of encephalopathy with widespread electrolyte derangements."    Assessment/Plan: #1 Wernicke encephalopathy It was noted that patient was brought in by a friend that she was unable to care for herself and living alone. Patient did not have any asterixis. Ammonia level was minimally elevated on admission. MRI of the head was negative for any acute infarct. Patient was noted to have a vitamin B-1 deficiency with some nystagmus as well as ataxia as documented from evaluation on admission and as per PT. Patient was meeting criteria for Wernicke encephalopathy. Continue IV vitamin B-1, IV thiamine. Patient has been seen in consultation by psychiatry who recommended to continue IVC and patient did not have capacity to make her own decisions. Patient likely needs placement as patient lives alone.  #2 alcohol abuse/vitamin B-1 deficiency Continue the Ativan withdrawal protocol. Continue vitamin B-1 replacement.  #3 hypokalemia/hypomagnesemia Repleted.  #4 constipation Continue lactulose.  #5 pressure ulcers Patient with pressure ulcers noted on buttocks. Frequent turning. Monitor.   Code Status: Full  Family Communication: Updated patient. No family at bedside. Disposition Plan: Patient will likely need placement.   Consultants:  Psychiatry: Dr. Louretta Shorten  02/09/2016  Procedures:  CT abdomen and pelvis 02/07/2016,  CT head 02/07/2016  Chest x-ray 02/07/2016  MRI head 02/10/2016  Antibiotics:  None  HPI/Subjective: No shortness of breath. No chest pain. Tolerating oral intake.  Objective: Filed Vitals:   02/18/16 0633 02/18/16 1509  BP: 94/69 128/69  Pulse: 80 92  Temp: 98.5 F (36.9 C) 99.1 F (37.3 C)  Resp: 18 18    Intake/Output Summary (Last 24 hours) at 02/18/16 1952 Last data filed at 02/17/16 2100  Gross per 24 hour  Intake    240 ml  Output      0 ml  Net    240 ml   Filed Weights   02/15/16 0605 02/16/16 0615 02/16/16 0731  Weight: 65.6 kg (144 lb 10 oz) 66.044 kg (145 lb 9.6 oz) 66 kg (145 lb 8.1 oz)    Exam:   General:  NAD  Cardiovascular: RRR  Respiratory: CTAB  Abdomen: Soft, nontender, nondistended, positive bowel sounds.  Musculoskeletal: No clubbing cyanosis or edema.  Data Reviewed: Basic Metabolic Panel:  Recent Labs Lab 02/12/16 0451 02/13/16 0447 02/14/16 0436 02/16/16 0442  NA 140 142 140 139  K 3.5 3.6 3.6 3.7  CL 112* 110 109 106  CO2 22 24 24 26   GLUCOSE 100* 80 69 68  BUN <5* <5* <5* 8  CREATININE 0.42* 0.48 0.56 0.58  CALCIUM 9.2 9.1 9.0 9.0  MG 1.9 1.9 1.8 1.8   Liver Function Tests:  Recent Labs Lab 02/13/16 0447  AST 34  ALT 38  ALKPHOS 111  BILITOT 1.2  PROT 5.7*  ALBUMIN 2.8*   No results for input(s): LIPASE, AMYLASE in the last 168 hours. No results for input(s): AMMONIA in the last 168 hours. CBC:  Recent Labs Lab 02/13/16 0447  WBC 5.8  HGB 9.5*  HCT 28.6*  MCV 102.5*  PLT 459*   Cardiac Enzymes: No results for input(s): CKTOTAL, CKMB, CKMBINDEX, TROPONINI in the last 168 hours. BNP (last 3 results) No results for input(s): BNP in the last 8760 hours.  ProBNP (last 3 results) No results for input(s): PROBNP in the last 8760 hours.  CBG:  Recent Labs Lab 02/14/16 0707 02/15/16 0750 02/16/16 0747 02/18/16 0755  02/18/16 1127  GLUCAP 77 85 84 95 89    No results found for this or any previous visit (from the past 240 hour(s)).   Studies: No results found.  Scheduled Meds: . enoxaparin (LOVENOX) injection  40 mg Subcutaneous Q24H  . escitalopram  10 mg Oral Daily  . feeding supplement (ENSURE ENLIVE)  237 mL Oral BID BM  . ferrous sulfate  325 mg Oral TID WC  . folic acid  1 mg Oral Daily  . gabapentin  100 mg Oral QHS  . lactulose  20 g Oral BID  . LORazepam  0.5 mg Intramuscular Once  . mirtazapine  7.5 mg Oral QHS  . multivitamin with minerals  1 tablet Oral Daily  . thiamine IV  250 mg Intravenous Q24H   Continuous Infusions:   Principal Problem:   Wernicke encephalopathy Active Problems:   Depression   Alcohol dependence (HCC)   Severe recurrent major depression without psychotic features (HCC)   Neuropathy (HCC)   Hyponatremia   Transaminitis   Decubitus ulcer   Hypokalemia   Serum total bilirubin elevated   Hepatic steatosis  Acute encephalopathy   Hyperglycemia   Vitamin B1 deficiency neuropathy   H/O gastric bypass   Constipation    Time spent: 62 minutes    THOMPSON,DANIEL M.D. [facility] Pager [phone]. If 7PM-7AM, please contact night-coverage at [email or web address], password [email or web address] 02/18/2016, 7:52 PM  LOS: 11 days

## 2016-02-19 LAB — BASIC METABOLIC PANEL
ANION GAP: 6 (ref 5–15)
BUN: 8 mg/dL (ref 6–20)
CALCIUM: 9.2 mg/dL (ref 8.9–10.3)
CHLORIDE: 109 mmol/L (ref 101–111)
CO2: 27 mmol/L (ref 22–32)
Creatinine, Ser: 0.57 mg/dL (ref 0.44–1.00)
GFR calc non Af Amer: >60 mL/min (ref >60)
GLUCOSE: 79 mg/dL (ref 65–99)
POTASSIUM: 4 mmol/L (ref 3.5–5.1)
Sodium: 142 mmol/L (ref 135–145)

## 2016-02-19 LAB — MAGNESIUM: Magnesium: 1.8 mg/dL (ref 1.7–2.4)

## 2016-02-19 MED ORDER — POLYETHYLENE GLYCOL 3350 17 G PO PACK: 17.0000 g | PACK | Freq: Every day | ORAL | Status: DC

## 2016-02-19 MED ORDER — FOLIC ACID 1 MG PO TABS: 1.0000 mg | ORAL_TABLET | Freq: Every day | ORAL | Status: DC

## 2016-02-19 MED ORDER — ESCITALOPRAM OXALATE 10 MG PO TABS: 10.0000 mg | ORAL_TABLET | Freq: Every day | ORAL | Status: DC

## 2016-02-19 MED ORDER — FERROUS SULFATE 325 (65 FE) MG PO TABS: 325.0000 mg | ORAL_TABLET | Freq: Three times a day (TID) | ORAL | Status: DC

## 2016-02-19 MED ORDER — MIRTAZAPINE 7.5 MG PO TABS: 7.5000 mg | ORAL_TABLET | Freq: Every day | ORAL | Status: DC

## 2016-02-19 MED ORDER — THIAMINE HCL 100 MG/ML IJ SOLN: 100.0000 mg | INTRAMUSCULAR | Status: DC

## 2016-02-19 MED ORDER — GABAPENTIN 300 MG PO CAPS: 300.0000 mg | ORAL_CAPSULE | Freq: Three times a day (TID) | ORAL | Status: DC

## 2016-02-19 NOTE — Discharge Summary (Signed)
Physician Discharge Summary  Natalie Mcdaniel [id number] DOB: Nov 26, 1966 DOA: 02/07/2016  PCP: JEFFERY,CHELLE, PA-C  Admit date: 02/07/2016 Discharge date: 02/19/2016  Time spent: 65 minutes  Recommendations for Outpatient Follow-up:  1. Follow-up with M.D./PCP in 2-3 weeks. All follow-up vitamin B-1 levels will need to be checked and further decision on further vitamin B 1 supplementation will need to be determined. Patient likely needs IM shots as she is status post Roux-en-Y. Patient also need a basic metabolic profile as well as a magnesium level checked.   Discharge Diagnoses:  Principal Problem:   Wernicke encephalopathy Active Problems:   Depression   Alcohol dependence (HCC)   Severe recurrent major depression without psychotic features (HCC)   Neuropathy (HCC)   Hyponatremia   Transaminitis   Decubitus ulcer   Hypokalemia   Serum total bilirubin elevated   Hepatic steatosis   Acute encephalopathy   Hyperglycemia   Vitamin B1 deficiency neuropathy   H/O gastric bypass   Constipation   Discharge Condition: Stable and improved  Diet recommendation: Regular  Filed Weights   02/15/16 0605 02/16/16 0615 02/16/16 0731  Weight: 65.6 kg (144 lb 10 oz) 66.044 kg (145 lb 9.6 oz) 66 kg (145 lb 8.1 oz)    History of present illness:  Per Dr Myna Hidalgo [name] is a 50 y.o. female with PMH of morbid obesity status post Roux-en-Y bypass, chronic alcoholism with no known history of DT or seizures, depression, and anxiety who presented from home via EMS after she was found on the floor by a friend who was concerned for the patient's inability to adequately care for herself. Patient's friend was at the bedside and assisted with the history. Patient has a long history of chronic alcohol abuse, citing this as the reason she has not followed with her PCP or adhered to her treatment plan. She had been largely confined to her bed for at least 6 months, explaining that her peripheral  neuropathy precluded her ability to ambulate. Over this interval, she has developed pressure sores over her buttock. Her friend at the bedside reportedly checks on her several times a week, finding the patient's house to be in disarray with trash throughout and foul odor. Patient's friend reportedly brings meals when she visits, but the patient otherwise does not eat. She had previously obtained a bachelor's degree and worked for [employer] before her alcohol dependence made this impossible. She noted that since she had lost her job, she has not had money to buy alcohol, and stated that unless a friend brings her some, she has had to go without. Her friend agreed that she had been drinking much less than previously when she would consume approximately 24 12-ounce beers daily.  In ED, patient was found to be afebrile, saturating well on room air, and with vital signs stable. Chest x-ray was negative for acute cardiopulmonary disease and noncontrast head CT was negative for acute intracranial abnormality. EKG revealed a sinus rhythm with nonspecific ST changes in the inferior leads. Basic lab work was obtained and CMP returned with sodium of 128, potassium 2.7, moderate elevations in transaminases, and a bilirubin of 5.1. CBC is unremarkable. Ethanol and troponin levels are undetectable, and ammonia returned slightly elevated to 41. Given the elevated transaminases and bilirubin, a contrast-enhanced CT of the abdomen was obtained and demonstrative of mild hepatic steatosis. Patient was bolused with 1 L normal saline in the emergency department, given 1 g of magnesium, 10 mEq IV potassium, and a single dose  of Norco 5 mg. She remained hemodynamically stable in the emergency department and was admitted for ongoing evaluation and management of encephalopathy with widespread electrolyte derangements.   Hospital Course:  #1 Wernicke encephalopathy It was noted that patient was brought in by a friend that she was unable  to care for herself and living alone. Patient did also complain of gait ataxia which was keeping her bedridden. Patient did not have any asterixis. Ammonia level was minimally elevated on admission. MRI of the head was negative for any acute infarct. Patient was noted to have a vitamin B-1 deficiency with some nystagmus as well as ataxia as documented from evaluation on admission and as per PT. Patient was meeting criteria for Wernicke encephalopathy. Patient was placed on IV B-1 and given 500 mg IV 3 times daily 3 days and then change to 250 mg IV daily for 5 days. Patient was also maintained on folic acid and a multivitamin. Patient has been seen in consultation by psychiatry who recommended to continue IVC and patient did not have capacity to make her own decisions. Patient will be discharged to the safety of her family will be taking her to live with relatives in [place]. Patient will be discharged on thiamine 100 mg IM daily 14 days and will need to follow-up with her PCP as outpatient.  #2 alcohol abuse/vitamin B-1 deficiency Patient was admitted and placed on the Ativan withdrawal protocol. Vitamin B-1 levels were obtained which came back decreased at 43.6. B-12 levels were within normal limits at 574. Patient was placed on IV vitamin B-1 levels and given 500 mg IV 3 times daily 3 days as patient was felt to have a Wernicke's encephalopathy subsequently changed to 250 mg IV daily for 5 days. Patient will be discharged on vitamin B-1/thiamine 100 mg IM 2 weeks and will need to follow-up with PCP/M.D. for repeat labs and further recommendations. Patient was discharged in stable and improved condition.   #3 hypokalemia/hypomagnesemia/hyponatremia On admission patient was noted to have several electrolyte derangements including hyponatremia, hypokalemia and hypomagnesemia likely secondary to ongoing alcohol use and inability to absorb due to history of Roux-en-Y.Patient's electrolytes and were repleted  and electrolyte derangements had resolved by day of discharge.  #4 constipation Patient was maintained on lactulose during the hospitalization will be discharged home on MiraLAX daily.   #5 pressure ulcers Patient with pressure ulcers noted on buttocks. Frequent turning. Monitor.   Procedures:  CT abdomen and pelvis 02/07/2016,  CT head 02/07/2016  Chest x-ray 02/07/2016  MRI head 02/10/2016    Consultations:  Psychiatry: Dr. Louretta Shorten 02/09/2016  Discharge Exam: Filed Vitals:   02/18/16 2023 02/19/16 0610  BP: 106/65 111/69  Pulse: 84 68  Temp: 98.6 F (37 C) 99 F (37.2 C)  Resp: 17 18    General: NAD Cardiovascular: RRR Respiratory: CTAB  Discharge Instructions   Discharge Instructions    Diet general    Complete by:  As directed      Discharge instructions    Complete by:  As directed   Follow up with MD in 2-3 weeks.     Increase activity slowly    Complete by:  As directed           Current Discharge Medication List    START taking these medications   Details  escitalopram (LEXAPRO) 10 MG tablet Take 1 tablet (10 mg total) by mouth daily. Qty: 30 tablet, Refills: 2    ferrous sulfate 325 (65 FE) MG tablet Take  1 tablet (325 mg total) by mouth 3 (three) times daily with meals. Refills: 0    folic acid (FOLVITE) 1 MG tablet Take 1 tablet (1 mg total) by mouth daily.    mirtazapine (REMERON) 7.5 MG tablet Take 1 tablet (7.5 mg total) by mouth at bedtime. Qty: 30 tablet, Refills: 0    polyethylene glycol (MIRALAX / GLYCOLAX) packet Take 17 g by mouth daily. Qty: 14 each, Refills: 0    thiamine (B-1) 100 MG/ML injection Inject 1 mL (100 mg total) into the muscle daily. Qty: 14 mL, Refills: 0      CONTINUE these medications which have CHANGED   Details  gabapentin (NEURONTIN) 300 MG capsule Take 1 capsule (300 mg total) by mouth 3 (three) times daily. Qty: 90 capsule, Refills: 0      CONTINUE these medications which have NOT CHANGED    Details  Multiple Vitamin (MULTIVITAMIN WITH MINERALS) TABS tablet Take 1 tablet by mouth daily.      STOP taking these medications     carbamazepine (TEGRETOL) 200 MG tablet      DULoxetine (CYMBALTA) 30 MG capsule      traZODone (DESYREL) 100 MG tablet        Allergies  Allergen Reactions  . Bee Venom Anaphylaxis  . Wasp Venom Anaphylaxis   Follow-up Information    Follow up In 2 weeks.   Why:  F/U with MD in 2-3 weeks.       The results of significant diagnostics from this hospitalization (including imaging, microbiology, ancillary and laboratory) are listed below for reference.    Significant Diagnostic Studies: Dg Chest 2 View  02/07/2016  CLINICAL DATA:  Found unresponsive on floor EXAM: CHEST  2 VIEW COMPARISON:  None. FINDINGS: The heart size and mediastinal contours are within normal limits. Both lungs are clear. The visualized skeletal structures are unremarkable. IMPRESSION: No active cardiopulmonary disease. Electronically Signed   By: Inez Catalina M.D.   On: 02/07/2016 17:30   Ct Head Wo Contrast  02/07/2016  CLINICAL DATA:  Acute mental status change. EXAM: CT HEAD WITHOUT CONTRAST TECHNIQUE: Contiguous axial images were obtained from the base of the skull through the vertex without intravenous contrast. COMPARISON:  June 13, 2015 FINDINGS: Paranasal sinuses, mastoid air cells, and bones are within normal limits. The extracranial soft tissues are within normal limits. No subdural, epidural, or subarachnoid hemorrhage. No mass, mass effect, or midline shift. Ventricles and sulci are stable and unremarkable. The cerebellum, brainstem, and basal cisterns are normal. Calcifications are seen in the left greater than right basal ganglia. No acute cortical ischemia or infarct. IMPRESSION: No acute intracranial process. Electronically Signed   By: Dorise Bullion III M.D   On: 02/07/2016 17:40   Mr Brain Wo Contrast  02/10/2016  CLINICAL DATA:  Acute encephalopathy. EXAM:  MRI HEAD WITHOUT CONTRAST TECHNIQUE: Multiplanar, multiecho pulse sequences of the brain and surrounding structures were obtained without intravenous contrast. COMPARISON:  CT head 02/07/2016 FINDINGS: Generalized atrophy.  Negative for hydrocephalus. Negative for acute infarct. Scattered small white matter hyperintensities bilaterally consistent with mild chronic microvascular ischemia. Basal ganglia and brainstem normal. Negative for intracranial hemorrhage.  Negative for mass or edema. Normal skull base.  Paranasal sinuses clear.  Pituitary not enlarged Image quality degraded by motion IMPRESSION: Negative for acute infarct. Atrophy with mild chronic microvascular ischemia. Electronically Signed   By: Franchot Gallo M.D.   On: 02/10/2016 11:45   Ct Abdomen Pelvis W Contrast  02/07/2016  CLINICAL  DATA:  50 year old female with elevated LFTs and pressure ulcers on the buttock. EXAM: CT ABDOMEN AND PELVIS WITH CONTRAST TECHNIQUE: Multidetector CT imaging of the abdomen and pelvis was performed using the standard protocol following bolus administration of intravenous contrast. CONTRAST:  171mL OMNIPAQUE IOHEXOL 300 MG/ML  SOLN COMPARISON:  None. FINDINGS: The visualized lung bases are clear. No intra-abdominal free air or free fluid. Probable mild fatty infiltration of the liver. The gallbladder, pancreas, spleen, adrenal glands, kidneys, visualized ureters, and urinary bladder appear unremarkable. The uterus and ovaries are grossly unremarkable. There postsurgical changes of gastric bypass. There is no evidence of bowel obstruction or inflammation. Normal appendix. The abdominal aorta and IVC appears patent. No portal venous gas identified. There is no adenopathy. There is a midline vertical anterior pelvic wall incisional scar. Small fat containing umbilical hernia. There is mild haziness of the herniated fat as well as mild haziness of the omentum. Clinical correlation is recommended to exclude  strangulation/incarceration. No fluid collection identified. There is mild diffuse subcutaneous soft tissue stranding. It is a small calcific density noted in the subcutaneous soft tissues of the right gluteal region likely is a granuloma and sequela of prior insult. There is mild degenerative changes of the spine. No acute fracture. IMPRESSION: No acute intra-abdominal pelvic pathology. Mild fatty liver. Small fat containing umbilical hernia. Electronically Signed   By: Anner Crete M.D.   On: 02/07/2016 19:56    Microbiology: No results found for this or any previous visit (from the past 240 hour(s)).   Labs: Basic Metabolic Panel:  Recent Labs Lab 02/13/16 0447 02/14/16 0436 02/16/16 0442 02/19/16 0406  NA 142 140 139 142  K 3.6 3.6 3.7 4.0  CL 110 109 106 109  CO2 24 24 26 27   GLUCOSE 80 69 68 79  BUN <5* <5* 8 8  CREATININE 0.48 0.56 0.58 0.57  CALCIUM 9.1 9.0 9.0 9.2  MG 1.9 1.8 1.8 1.8   Liver Function Tests:  Recent Labs Lab 02/13/16 0447  AST 34  ALT 38  ALKPHOS 111  BILITOT 1.2  PROT 5.7*  ALBUMIN 2.8*   No results for input(s): LIPASE, AMYLASE in the last 168 hours. No results for input(s): AMMONIA in the last 168 hours. CBC:  Recent Labs Lab 02/13/16 0447  WBC 5.8  HGB 9.5*  HCT 28.6*  MCV 102.5*  PLT 459*   Cardiac Enzymes: No results for input(s): CKTOTAL, CKMB, CKMBINDEX, TROPONINI in the last 168 hours. BNP: BNP (last 3 results) No results for input(s): BNP in the last 8760 hours.  ProBNP (last 3 results) No results for input(s): PROBNP in the last 8760 hours.  CBG:  Recent Labs Lab 02/14/16 0707 02/15/16 0750 02/16/16 0747 02/18/16 0755 02/18/16 1127  GLUCAP 77 85 84 95 89       Signed:  THOMPSON,DANIEL MD.  [facility] 02/19/2016, 10:40 AM

## 2017-01-10 IMAGING — MR MR HEAD W/O CM
8 of 10 series · 37 of 48 positions shown · non-contrast
Comparison: CT head 02/07/2016

CLINICAL DATA: Acute encephalopathy.

EXAM:
MRI HEAD WITHOUT CONTRAST
TECHNIQUE: Multiplanar, multiecho pulse sequences of the brain and surrounding
structures were obtained without intravenous contrast.

[Series 3: T1 · sagittal · 5.0mm · 0.47mm/px · 2 of 24 slices shown]
[im 1/24]
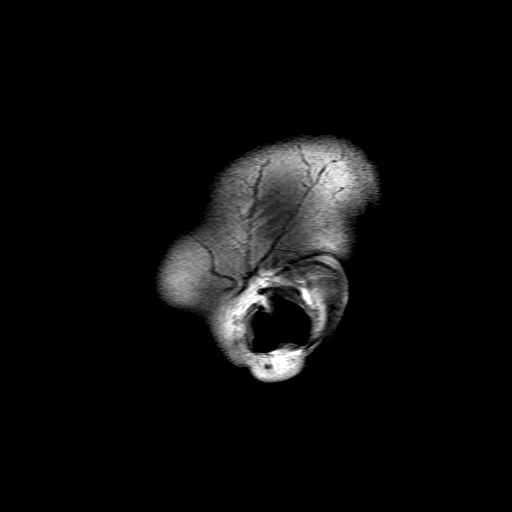
[im 24/24]
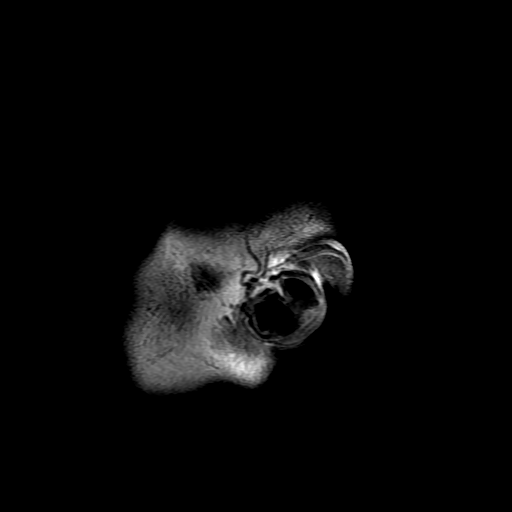

[Series 4: DWI · axial · 3.0mm · 1.09mm/px · z∈[-12,+134]mm · 8 of 100 slices shown (1 of 4)]
[im 1/100]
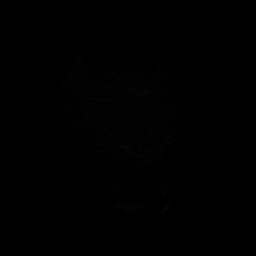
[im 12/100]
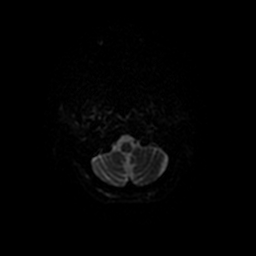
[im 34/100]
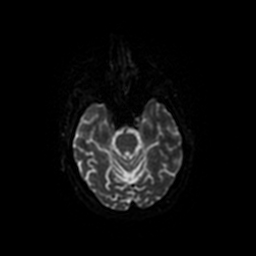
[im 45/100]
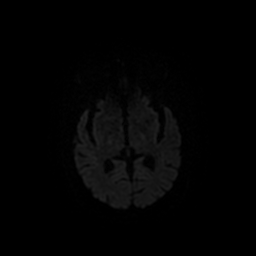
[im 56/100]
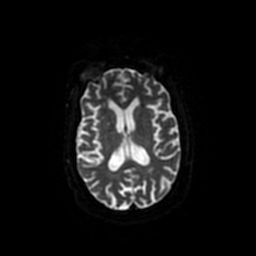
[im 67/100]
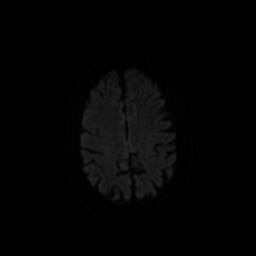
[im 89/100]
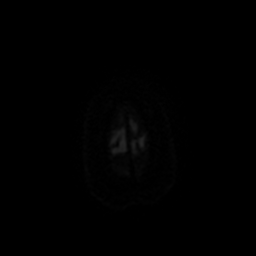
[im 100/100]
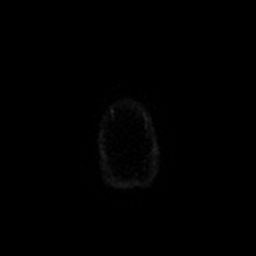

[Series 5: DWI · coronal · 5.0mm · 1.09mm/px · 8 of 71 slices shown (2 of 4)]
[im 1/71]
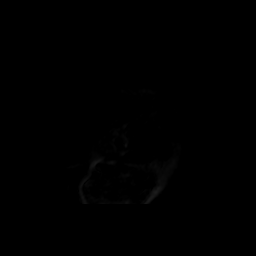
[im 11/71]
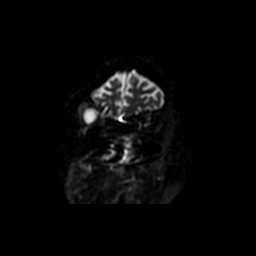
[im 21/71]
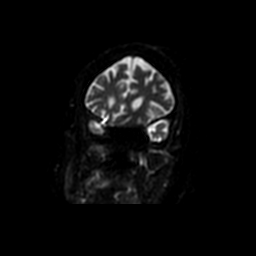
[im 31/71]
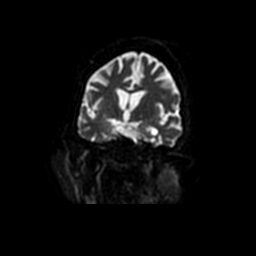
[im 41/71]
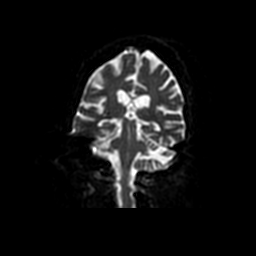
[im 51/71]
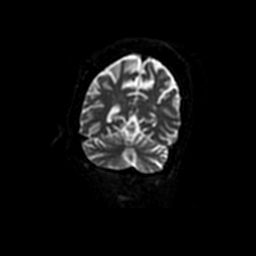
[im 61/71]
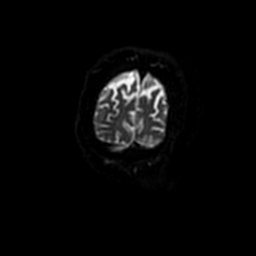
[im 71/71]
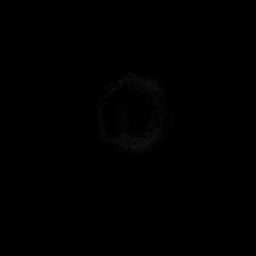

[Series 6: T2 · axial · 5.0mm · 0.43mm/px · z∈[-17,+132]mm · 3 of 24 slices shown]
[im 1/24]
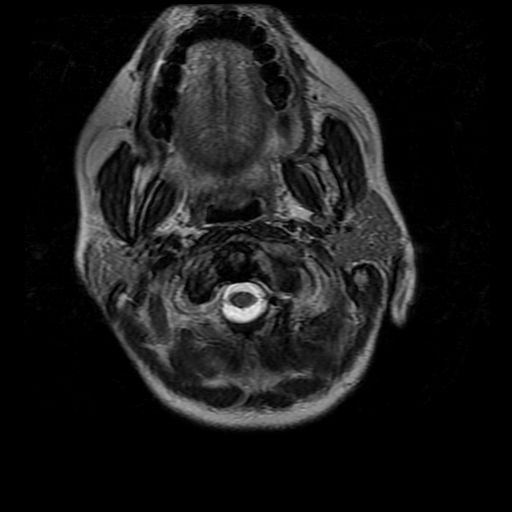
[im 12/24]
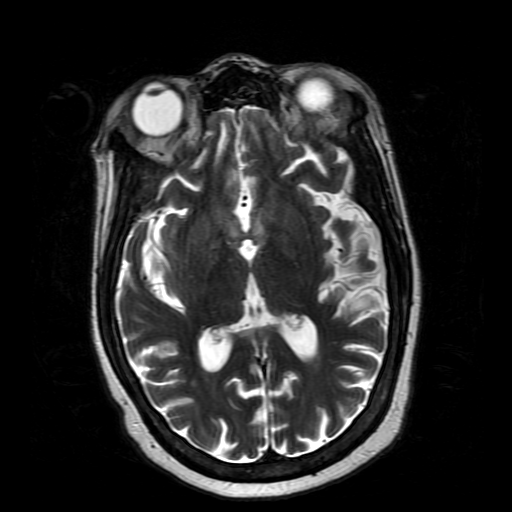
[im 24/24]
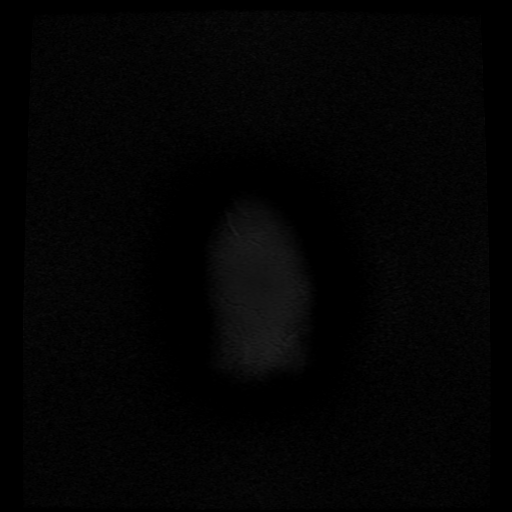

[Series 7: FLAIR · axial · 5.0mm · 0.43mm/px · z∈[-23,+138]mm · 3 of 24 slices shown]
[im 1/24]
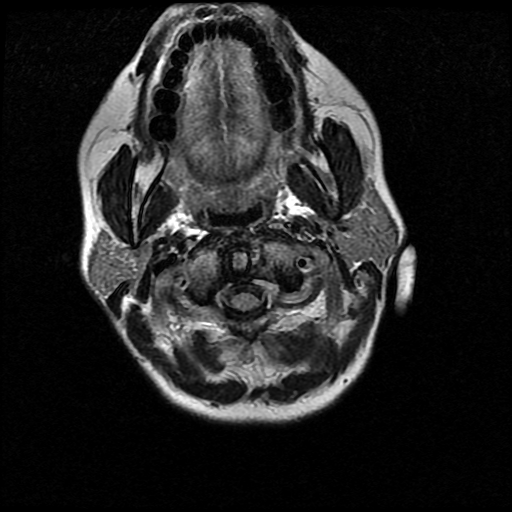
[im 12/24]
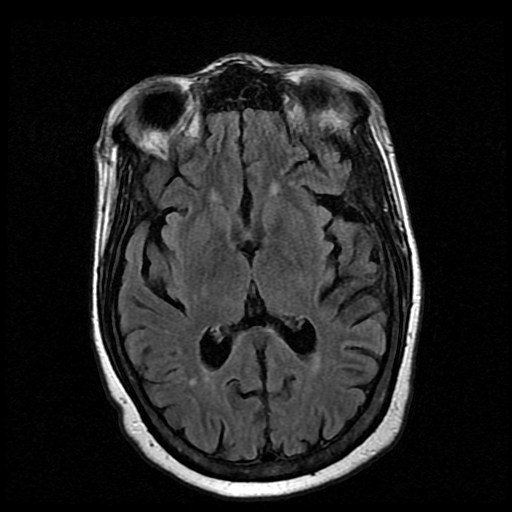
[im 24/24]
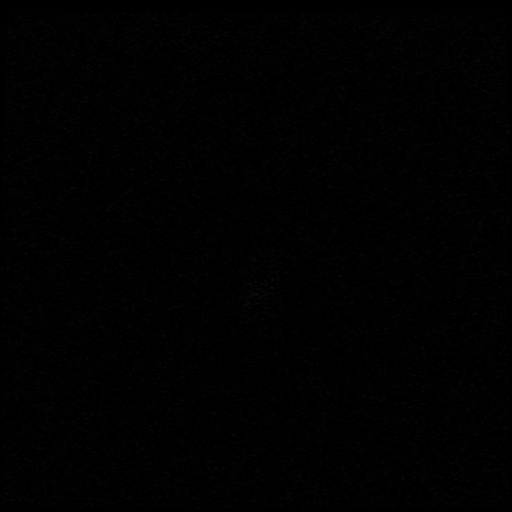

[Series 10: T2 post-contrast · coronal · 5.0mm · 0.45mm/px · 3 of 28 slices shown]
[im 1/28]
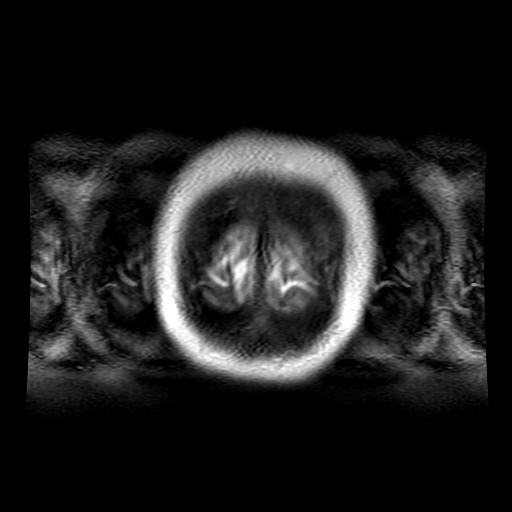
[im 14/28]
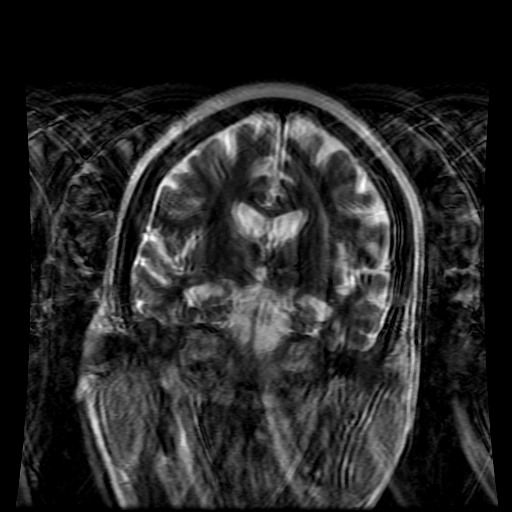
[im 28/28]
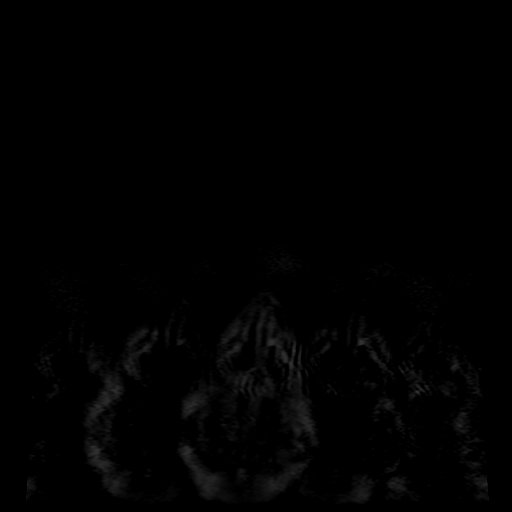

[Series 400: DWI · axial · 3.0mm · 1.09mm/px · z∈[-12,+134]mm · 6 of 50 slices shown (3 of 4)]
[im 1/50]
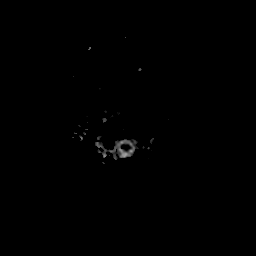
[im 10/50]
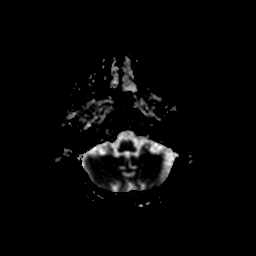
[im 20/50]
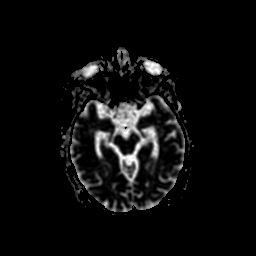
[im 30/50]
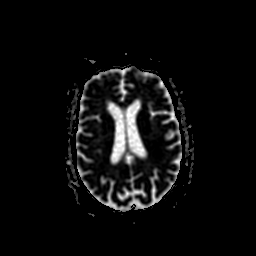
[im 40/50]
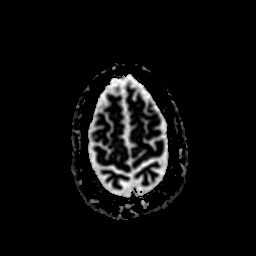
[im 50/50]
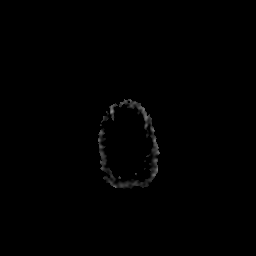

[Series 500: DWI · coronal · 5.0mm · 1.09mm/px · 4 of 36 slices shown (4 of 4)]
[im 1/36]
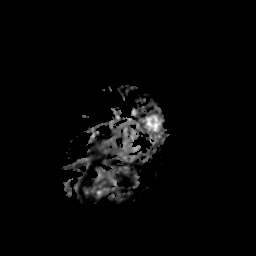
[im 12/36]
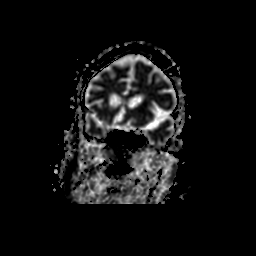
[im 24/36]
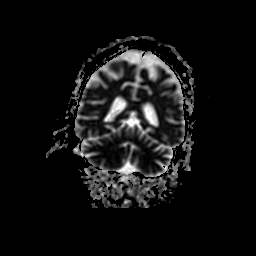
[im 36/36]
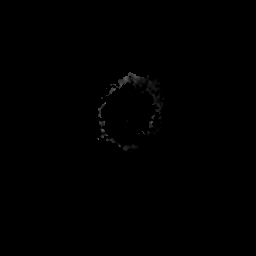

[37 of 48 positions shown; findings below may reference images not displayed]

FINDINGS: Generalized atrophy.  Negative for hydrocephalus.

Negative for acute infarct. Scattered small white matter
hyperintensities bilaterally consistent with mild chronic
microvascular ischemia. Basal ganglia and brainstem normal.

Negative for intracranial hemorrhage.  Negative for mass or edema.

Normal skull base.  Paranasal sinuses clear.  Pituitary not enlarged

Image quality degraded by motion
IMPRESSION: Negative for acute infarct. Atrophy with mild chronic microvascular
ischemia.

## 2018-03-29 ENCOUNTER — Encounter: Payer: Self-pay | Admitting: Physician Assistant

## 2021-03-12 ENCOUNTER — Ambulatory Visit: Payer: Self-pay | Admitting: Family Medicine

## 2021-03-13 ENCOUNTER — Ambulatory Visit: Payer: Self-pay | Admitting: Family Medicine

## 2021-03-26 ENCOUNTER — Other Ambulatory Visit: Payer: Self-pay

## 2021-03-26 ENCOUNTER — Encounter: Payer: Self-pay | Admitting: Family Medicine

## 2021-03-26 ENCOUNTER — Ambulatory Visit (INDEPENDENT_AMBULATORY_CARE_PROVIDER_SITE_OTHER): Payer: Self-pay | Admitting: Family Medicine

## 2021-03-26 VITALS — BP 99/65 | HR 72 | Ht 65.5 in | Wt 198.0 lb

## 2021-03-26 DIAGNOSIS — Z833 Family history of diabetes mellitus: Secondary | ICD-10-CM

## 2021-03-26 DIAGNOSIS — Z7689 Persons encountering health services in other specified circumstances: Secondary | ICD-10-CM

## 2021-03-26 DIAGNOSIS — Z09 Encounter for follow-up examination after completed treatment for conditions other than malignant neoplasm: Secondary | ICD-10-CM

## 2021-03-26 DIAGNOSIS — G629 Polyneuropathy, unspecified: Secondary | ICD-10-CM

## 2021-03-26 DIAGNOSIS — Z0000 Encounter for general adult medical examination without abnormal findings: Secondary | ICD-10-CM

## 2021-03-26 DIAGNOSIS — J302 Other seasonal allergic rhinitis: Secondary | ICD-10-CM

## 2021-03-26 DIAGNOSIS — Z1231 Encounter for screening mammogram for malignant neoplasm of breast: Secondary | ICD-10-CM

## 2021-03-26 MED ORDER — GABAPENTIN 100 MG PO CAPS: 100.0000 mg | ORAL_CAPSULE | Freq: Three times a day (TID) | ORAL | 3 refills | Status: DC

## 2021-03-26 NOTE — Progress Notes (Signed)
Natalie Mcdaniel Natalie Mcdaniel Natalie Mcdaniel Natalie Mcdaniel Natalie Mcdaniel Natalie Mcdaniel Cell Natalie Mcdaniel    Natalie Mcdaniel Natalie Mcdaniel Natalie Mcdaniel  Natalie Mcdaniel:  Natalie Mcdaniel Natalie Mcdaniel: Natalie Mcdaniel, female    Natalie Mcdaniel: 01-18-1966  Natalie Mcdaniel: 55 y.o. Natalie Mcdaniel: [id number]  Natalie Mcdaniel:  Natalie Mcdaniel Natalie Mcdaniel  Natalie Mcdaniel presents Natalie Mcdaniel  . Natalie Mcdaniel Natalie Mcdaniel  . Numbness    Hands Natalie Mcdaniel feet    Natalie Mcdaniel Natalie Mcdaniel Natalie Mcdaniel Natalie Mcdaniel 55 Natalie Mcdaniel Natalie Mcdaniel female Natalie Mcdaniel presents Natalie Mcdaniel Natalie Mcdaniel Natalie Mcdaniel.   Natalie Mcdaniel Active Natalie Mcdaniel List   Diagnosis Date Noted  . Natalie Mcdaniel   . Wernicke encephalopathy 02/12/2016  . Natalie Mcdaniel Natalie Mcdaniel Natalie Mcdaniel 02/12/2016  . 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 Hypokalemia 02/07/2016  . Serum total bilirubin elevated 02/07/2016  . Hepatic steatosis 02/07/2016  . Acute encephalopathy 02/07/2016  . Hyperglycemia 02/07/2016  . Toxic metabolic encephalopathy 35/32/9924  . Skin erosion   . Cellulitis 03/01/2015  . Hyponatremia 03/01/2015  . Transaminitis 03/01/2015  . Decubitus ulcer 03/01/2015  . 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 Neuropathy ([name]) 12/20/2014  . Natalie Mcdaniel dependence ([name]) 12/19/2014  . Natalie Mcdaniel 02/05/2012    Natalie Mcdaniel Natalie Mcdaniel: Natalie Mcdaniel Natalie Mcdaniel be Natalie Mcdaniel's initial Natalie Mcdaniel Natalie Mcdaniel Natalie Mcdaniel Natalie Mcdaniel. Natalie Mcdaniel Natalie Mcdaniel previously seeing Natalie Shepherd, Natalie Mcdaniel  Natalie Mcdaniel Natalie Mcdaniel Natalie Mcdaniel Natalie Mcdaniel. Natalie Mcdaniel Natalie Mcdaniel recently relocated back Natalie Mcdaniel [place] [facility] from Natalie Mcdaniel, which Natalie Mcdaniel Natalie Mcdaniel Natalie Mcdaniel Natalie Mcdaniel originally from. Natalie Mcdaniel Natalie Mcdaniel Natalie Mcdaniel Natalie Mcdaniel Natalie Mcdaniel, Natalie Mcdaniel Natalie Mcdaniel doing Natalie Mcdaniel Natalie Mcdaniel Natalie Mcdaniel Natalie Mcdaniel. Natalie Mcdaniel Natalie Mcdaniel Natalie Mcdaniel, Natalie Mcdaniel, Natalie Mcdaniel, Natalie Mcdaniel Natalie Mcdaniel, Natalie Mcdaniel Natalie Mcdaniel, Natalie Mcdaniel Natalie Mcdaniel Natalie Mcdaniel. Natalie Mcdaniel Natalie Mcdaniel Natalie Mcdaniel Natalie Mcdaniel Natalie Mcdaniel Natalie Mcdaniel, Natalie Mcdaniel Natalie Mcdaniel, Natalie Mcdaniel, Natalie Mcdaniel Natalie Mcdaniel. Natalie Mcdaniel Natalie Mcdaniel Natalie Mcdaniel, Natalie Mcdaniel Natalie Mcdaniel, Natalie Mcdaniel Natalie Mcdaniel Natalie Mcdaniel Natalie Mcdaniel Natalie Mcdaniel Natalie Mcdaniel. Natalie Mcdaniel Natalie Mcdaniel Natalie Mcdaniel Natalie Mcdaniel Natalie Mcdaniel Natalie Mcdaniel Natalie Mcdaniel Natalie Mcdaniel, Natalie Mcdaniel, Natalie Mcdaniel, Natalie Mcdaniel Natalie Mcdaniel. Natalie Mcdaniel Natalie Mcdaniel Natalie Mcdaniel Natalie Mcdaniel Natalie Mcdaniel Natalie Mcdaniel Natalie Mcdaniel Natalie Mcdaniel, Natalie Mcdaniel Natalie Mcdaniel Natalie Mcdaniel. Natalie Mcdaniel  Natalie Mcdaniel Natalie Mcdaniel Natalie Mcdaniel, Natalie Mcdaniel Natalie Mcdaniel Natalie Mcdaniel Natalie Mcdaniel, Natalie Mcdaniel Natalie Mcdaniel, Natalie Mcdaniel Natalie Mcdaniel Natalie Mcdaniel. Natalie Mcdaniel Natalie Mcdaniel Natalie Mcdaniel Natalie Mcdaniel Natalie Mcdaniel Natalie Mcdaniel Natalie Mcdaniel. Natalie Mcdaniel Natalie Mcdaniel Natalie Mcdaniel Natalie Mcdaniel.   Natalie Mcdaniel Natalie Mcdaniel Natalie Mcdaniel:  Diagnosis Date  . Natalie Mcdaniel /Natalie Mcdaniel Natalie Mcdaniel   . Natalie Mcdaniel   . Natalie Mcdaniel   . Natalie Mcdaniel   . Natalie Mcdaniel Natalie Mcdaniel Natalie Mcdaniel Natalie Mcdaniel Natalie Mcdaniel Natalie Mcdaniel Natalie Mcdaniel   . Natalie Mcdaniel Natalie Mcdaniel Natalie Mcdaniel Natalie Mcdaniel Natalie Mcdaniel Natalie Mcdaniel   Natalie Mcdaniel >300 Natalie Mcdaniel  . Natalie Mcdaniel    Natalie Mcdaniel Natalie Mcdaniel  . Natalie Mcdaniel Natalie Mcdaniel     Natalie Mcdaniel Natalie Mcdaniel Natalie Mcdaniel:  Procedure Laterality Date  . Natalie Mcdaniel   Natalie Mcdaniel Natalie Mcdaniel  . Natalie Mcdaniel Natalie Mcdaniel    . Natalie Mcdaniel Natalie Mcdaniel Natalie Mcdaniel Natalie Mcdaniel Natalie Mcdaniel    . Natalie Mcdaniel Natalie Mcdaniel    . Natalie Mcdaniel Natalie Mcdaniel Natalie Mcdaniel  Natalie Mcdaniel   Natalie Mcdaniel >300 Natalie Mcdaniel  . Natalie Mcdaniel Natalie Mcdaniel Bilateral     Natalie Mcdaniel Natalie Mcdaniel  Natalie Mcdaniel Natalie Mcdaniel Natalie Mcdaniel Natalie Mcdaniel Natalie Mcdaniel  . Natalie Mcdaniel Natalie Mcdaniel Natalie Mcdaniel   . Natalie Mcdaniel Natalie Mcdaniel        Natalie Mcdaniel Natalie Mcdaniel Natalie Mcdaniel, Natalie Mcdaniel Natalie Mcdaniel  . Natalie Mcdaniel Natalie Mcdaniel   . Natalie Mcdaniel Natalie Mcdaniel Natalie Mcdaniel   . Natalie Mcdaniel Natalie Mcdaniel   . Natalie Mcdaniel Natalie Mcdaniel   . Natalie Mcdaniel Natalie Mcdaniel Natalie Mcdaniel Natalie Mcdaniel   . Natalie Mcdaniel Natalie Mcdaniel Natalie Mcdaniel Natalie Mcdaniel   . Natalie Mcdaniel Natalie Mcdaniel   . Natalie Mcdaniel Natalie Mcdaniel Natalie Mcdaniel Natalie Mcdaniel     Natalie Mcdaniel Natalie Mcdaniel   Natalie Mcdaniel Natalie Mcdaniel  . Natalie Mcdaniel Natalie Mcdaniel: Natalie Mcdaniel    Natalie Mcdaniel Natalie Mcdaniel: Natalie Mcdaniel Natalie Mcdaniel Natalie Mcdaniel  . Natalie Mcdaniel Natalie Mcdaniel Natalie Mcdaniel: Natalie Mcdaniel  . Natalie Mcdaniel Natalie Mcdaniel Natalie Mcdaniel: Natalie Mcdaniel  . Natalie Mcdaniel Natalie Mcdaniel Natalie Mcdaniel: Natalie Mcdaniel Natalie Mcdaniel Natalie Mcdaniel  Natalie Mcdaniel Natalie Mcdaniel  . Natalie Mcdaniel: Natalie Mcdaniel, Natalie Mcdaniel  Natalie Mcdaniel: Natalie Mcdaniel Natalie Mcdaniel Natalie Mcdaniel  . Natalie Mcdaniel: Natalie Mcdaniel Natalie Mcdaniel, Natalie Mcdaniel    Natalie Mcdaniel: Natalie Mcdaniel  Natalie Mcdaniel Natalie Mcdaniel  . Natalie Mcdaniel Natalie Mcdaniel: Natalie Mcdaniel Natalie Mcdaniel  . Natalie Mcdaniel Natalie Mcdaniel: Natalie Mcdaniel Natalie Mcdaniel  Natalie Mcdaniel Natalie Mcdaniel Natalie Mcdaniel Natalie Mcdaniel  . Natalie Mcdaniel Natalie Mcdaniel: Natalie Mcdaniel    Natalie Mcdaniel: Natalie Mcdaniel Natalie Mcdaniel Natalie Mcdaniel    Natalie Mcdaniel: Natalie Mcdaniel Natalie Mcdaniel Natalie Mcdaniel Natalie Mcdaniel Natalie Mcdaniel Natalie Mcdaniel    Natalie Mcdaniel: Natalie Mcdaniel Natalie Mcdaniel Natalie Mcdaniel  . Natalie Mcdaniel Natalie Mcdaniel: Natalie Mcdaniel  . Natalie Mcdaniel Natalie Mcdaniel: Natalie Mcdaniel    Natalie Mcdaniel Natalie Mcdaniel: Natalie Mcdaniel    Natalie Mcdaniel: Natalie Mcdaniel Natalie Mcdaniel  Natalie Mcdaniel Natalie Mcdaniel Natalie Mcdaniel  . Natalie Mcdaniel Natalie Mcdaniel Natalie Mcdaniel  Natalie Mcdaniel Natalie Mcdaniel Natalie Mcdaniel   Natalie Mcdaniel Natalie Mcdaniel Natalie Mcdaniel Natalie Mcdaniel Natalie Mcdaniel, Natalie Mcdaniel, Natalie Mcdaniel Natalie Mcdaniel Natalie Mcdaniel Natalie Mcdaniel Natalie Mcdaniel, Natalie Mcdaniel. Natalie Mcdaniel Natalie Mcdaniel Natalie Mcdaniel Natalie Mcdaniel Natalie Mcdaniel Natalie Mcdaniel Natalie Mcdaniel Natalie Mcdaniel  Natalie Mcdaniel Natalie Mcdaniel Natalie Mcdaniel Natalie Mcdaniel Natalie Mcdaniel. Natalie Mcdaniel Natalie Mcdaniel Natalie Mcdaniel Natalie Mcdaniel Natalie Mcdaniel Natalie Mcdaniel Natalie Mcdaniel Natalie Mcdaniel Natalie Mcdaniel Natalie Mcdaniel Natalie Mcdaniel Natalie Mcdaniel Natalie Mcdaniel Natalie Mcdaniel Natalie Mcdaniel. Natalie Mcdaniel Natalie Mcdaniel Natalie Mcdaniel Natalie Mcdaniel Natalie Mcdaniel Natalie Mcdaniel Natalie Mcdaniel Natalie Mcdaniel Natalie Mcdaniel Natalie Mcdaniel Natalie Mcdaniel Natalie Mcdaniel Natalie Mcdaniel Natalie Mcdaniel Natalie Mcdaniel Natalie Mcdaniel Natalie Mcdaniel Natalie Mcdaniel. Natalie Mcdaniel Natalie Mcdaniel Natalie Mcdaniel Natalie Mcdaniel Natalie Mcdaniel Natalie Mcdaniel Natalie Mcdaniel Natalie Mcdaniel Natalie Mcdaniel Natalie Mcdaniel, Natalie Mcdaniel Natalie Mcdaniel Natalie Mcdaniel Natalie Mcdaniel Natalie Mcdaniel Natalie Mcdaniel Natalie Mcdaniel Natalie Mcdaniel Natalie Mcdaniel Natalie Mcdaniel Natalie Mcdaniel Natalie Mcdaniel Natalie Mcdaniel Natalie Mcdaniel. Natalie Mcdaniel Natalie Mcdaniel Natalie Mcdaniel Natalie Mcdaniel Natalie Mcdaniel Natalie Mcdaniel, Natalie Mcdaniel Natalie Mcdaniel Natalie Mcdaniel Natalie Mcdaniel Natalie Mcdaniel. Natalie Mcdaniel Natalie Mcdaniel Natalie Mcdaniel Natalie Mcdaniel Natalie Mcdaniel Natalie Mcdaniel Natalie Mcdaniel Natalie Mcdaniel Natalie Mcdaniel Natalie Mcdaniel. Natalie Mcdaniel Natalie Mcdaniel Natalie Mcdaniel Natalie Mcdaniel: Natalie Mcdaniel Natalie Mcdaniel Natalie Mcdaniel Natalie Mcdaniel Natalie Mcdaniel Natalie Mcdaniel Natalie Mcdaniel Natalie Mcdaniel. Natalie Mcdaniel Natalie Mcdaniel Natalie Mcdaniel Natalie Mcdaniel Natalie Mcdaniel Natalie Mcdaniel Natalie Mcdaniel Natalie Mcdaniel Natalie Mcdaniel Natalie Mcdaniel Natalie Mcdaniel Natalie Mcdaniel Natalie Mcdaniel Natalie Mcdaniel. Natalie Mcdaniel Natalie Mcdaniel Natalie Mcdaniel Natalie Mcdaniel Natalie Mcdaniel Natalie Mcdaniel Natalie Mcdaniel Natalie Mcdaniel, Natalie Mcdaniel Natalie Mcdaniel Natalie Mcdaniel Natalie Mcdaniel Natalie Mcdaniel Natalie Mcdaniel Natalie Mcdaniel Natalie Mcdaniel. Natalie Mcdaniel Natalie Mcdaniel Natalie Mcdaniel Natalie Mcdaniel Natalie Mcdaniel. Natalie Mcdaniel Natalie Mcdaniel Natalie Mcdaniel Natalie Mcdaniel Natalie Mcdaniel. Natalie Mcdaniel Natalie Mcdaniel Natalie Mcdaniel Natalie Mcdaniel Natalie Mcdaniel Natalie Mcdaniel Natalie Mcdaniel Natalie Mcdaniel Natalie Mcdaniel Natalie Mcdaniel Natalie Mcdaniel Natalie Mcdaniel, Natalie Mcdaniel Natalie Mcdaniel Natalie Mcdaniel, Natalie Mcdaniel Natalie Mcdaniel Natalie Mcdaniel.   Natalie Mcdaniel Natalie Mcdaniel Natalie Mcdaniel Natalie Mcdaniel   Natalie Mcdaniel Natalie Mcdaniel Natalie Mcdaniel: Natalie Mcdaniel Natalie Mcdaniel Natalie Mcdaniel  Natalie Mcdaniel Natalie Mcdaniel: Natalie Mcdaniel Natalie Mcdaniel Natalie Mcdaniel  Natalie Mcdaniel Natalie Mcdaniel: Natalie Mcdaniel Natalie Mcdaniel Natalie Mcdaniel  Natalie Mcdaniel Natalie Mcdaniel: Natalie Mcdaniel Natalie Mcdaniel Natalie Mcdaniel  Natalie Mcdaniel: Natalie Mcdaniel Natalie Mcdaniel Natalie Mcdaniel  Natalie Mcdaniel Natalie Mcdaniel: Natalie Mcdaniel Natalie Mcdaniel Natalie Mcdaniel  Natalie Mcdaniel Natalie Mcdaniel Natalie Mcdaniel: Natalie Mcdaniel Natalie Mcdaniel Natalie Mcdaniel    Natalie Mcdaniel Natalie Mcdaniel Natalie Mcdaniel Natalie Mcdaniel Natalie Mcdaniel  Natalie Mcdaniel Natalie Mcdaniel Natalie Mcdaniel Natalie Mcdaniel  . Natalie Mcdaniel (Natalie Mcdaniel) 10 MG tablet Take 1 tablet (10 mg total) Natalie Mcdaniel mouth Natalie Mcdaniel. 30 tablet Natalie Mcdaniel  . Natalie Mcdaniel Natalie Mcdaniel 325 (65 FE) MG tablet Take 1 tablet (325 mg total) Natalie Mcdaniel mouth Natalie Mcdaniel (three) times Natalie Mcdaniel Natalie Mcdaniel meals.  0  . Natalie Mcdaniel Natalie Mcdaniel (Natalie Mcdaniel) 1 MG tablet Take 1 tablet (1 mg total) Natalie Mcdaniel mouth Natalie Mcdaniel.    Marland Kitchen Natalie Mcdaniel (Natalie Mcdaniel) 300 MG capsule Take 1 capsule (300 mg total) Natalie Mcdaniel mouth Natalie Mcdaniel (three) times Natalie Mcdaniel. 90 capsule 0  . Natalie Mcdaniel (Natalie Mcdaniel) 7.5 MG tablet Take 1 tablet (7.5 mg total) Natalie Mcdaniel mouth Natalie Mcdaniel bedtime. 30 tablet 0  . Natalie Mcdaniel Natalie Mcdaniel (Natalie Mcdaniel Natalie Mcdaniel Natalie Mcdaniel) TABS tablet Take 1 tablet Natalie Mcdaniel mouth Natalie Mcdaniel.    . Natalie Mcdaniel Natalie Mcdaniel (Natalie Mcdaniel / Natalie Mcdaniel) packet Take 17 g Natalie Mcdaniel mouth Natalie Mcdaniel. 14 each 0  . Natalie Mcdaniel (Natalie Mcdaniel) 100 MG/ML injection Inject 1 mL  (100 mg total) into Natalie Mcdaniel muscle Natalie Mcdaniel. 14 mL 0   Natalie Mcdaniel facility-administered Natalie Mcdaniel Natalie Mcdaniel Natalie Mcdaniel Natalie Mcdaniel.    Natalie Mcdaniel  Allergen Reactions  . Natalie Mcdaniel Natalie Mcdaniel Anaphylaxis  . Wasp Natalie Mcdaniel Anaphylaxis    ROS Review Natalie Mcdaniel Systems  Constitutional: Negative.   HENT: Negative.   Eyes: Negative.   Respiratory: Negative.   Cardiovascular: Negative.   Gastrointestinal: Positive Natalie Mcdaniel abdominal distention (obese).  Endocrine: Negative.   Genitourinary: Negative.   Musculoskeletal: Positive Natalie Mcdaniel arthralgias (generalized).  Neurological: Positive Natalie Mcdaniel numbness (Natalie Mcdaniel/tingling bilateral lower extremities).  Hematological: Negative.   Psychiatric/Behavioral: Negative.       Objective:    Natalie Mcdaniel Exam Vitals Natalie Mcdaniel nursing note reviewed.  Constitutional:  Appearance: Normal appearance.  HENT:     Head: Normocephalic Natalie Mcdaniel atraumatic.     Nose: Nose normal.     Mouth/Throat:     Mouth: Mucous membranes are moist.     Pharynx: Oropharynx Natalie Mcdaniel clear.  Cardiovascular:     Rate Natalie Mcdaniel Rhythm: Normal rate Natalie Mcdaniel regular rhythm.     Pulses: Normal pulses.     Natalie Mcdaniel sounds: Normal Natalie Mcdaniel sounds.  Pulmonary:     Effort: Pulmonary effort Natalie Mcdaniel normal.     Natalie Mcdaniel sounds: Normal Natalie Mcdaniel sounds.  Abdominal:     General: Bowel sounds are normal.     Palpations: Abdomen Natalie Mcdaniel soft.  Musculoskeletal:        General: Normal range Natalie Mcdaniel motion.     Cervical back: Normal range Natalie Mcdaniel motion Natalie Mcdaniel neck supple.  Skin:    General: Skin Natalie Mcdaniel warm Natalie Mcdaniel dry.  Neurological:     General: Natalie Mcdaniel focal deficit present.     Mental Natalie Mcdaniel: Natalie Mcdaniel Natalie Mcdaniel alert Natalie Mcdaniel oriented Natalie Mcdaniel person, place, Natalie Mcdaniel Natalie Mcdaniel.  Psychiatric:        Mood Natalie Mcdaniel Affect: Mood normal.        Behavior: Behavior normal.        Thought Content: Thought content normal.        Judgment: Judgment normal.     BP 99/65   Pulse 72   Ht 5' 5.5" (1.664 m)   Wt 198 lb (Natalie Mcdaniel.8 kg)   LMP 08/31/2013   SpO2 100%   BMI 32.45 kg/m  Wt Readings from Natalie Mcdaniel Natalie Mcdaniel Encounters:  03/26/21 198 lb (Natalie Mcdaniel.8 kg)   02/16/16 145 lb 8.1 oz (66 kg)  02/10/16 153 lb (69.4 kg)     Natalie Mcdaniel Maintenance Due  Topic Date Due  . COVID-19 Vaccine (1) Natalie Mcdaniel done  . COLONOSCOPY (Pts 45-76yrs Natalie Mcdaniel coverage Natalie Mcdaniel need Natalie Mcdaniel be confirmed)  Natalie Mcdaniel done  . MAMMOGRAM  12/30/2014  . PAP SMEAR-Modifier  05/05/2016    There are Natalie Mcdaniel preventive Natalie Mcdaniel reminders Natalie Mcdaniel display Natalie Mcdaniel Natalie Mcdaniel Natalie Mcdaniel.  Lab Results  Component Value Date   TSH 0.987 03/26/2021   Lab Results  Component Value Date   WBC 9.1 03/26/2021   HGB 13.1 03/26/2021   HCT 39.Natalie Mcdaniel 03/26/2021   MCV 91 03/26/2021   PLT 304 03/26/2021   Lab Results  Component Value Date   NA 142 02/Natalie Mcdaniel/2017   K 4.0 02/Natalie Mcdaniel/2017   CO2 27 02/Natalie Mcdaniel/2017   GLUCOSE 79 02/Natalie Mcdaniel/2017   BUN 8 02/Natalie Mcdaniel/2017   CREATININE 0.57 02/Natalie Mcdaniel/2017   BILITOT 1.Natalie Mcdaniel 02/13/2016   ALKPHOS 111 02/13/2016   AST 34 02/13/2016   ALT 38 02/13/2016   PROT 5.7 (L) 02/13/2016   ALBUMIN Natalie Mcdaniel.8 (L) 02/13/2016   CALCIUM 9.Natalie Mcdaniel 02/Natalie Mcdaniel/2017   ANIONGAP Natalie Mcdaniel 02/Natalie Mcdaniel/2017   Lab Results  Component Value Date   CHOL 229 (H) 03/26/2021   Lab Results  Component Value Date   HDL 96 03/26/2021   Lab Results  Component Value Date   LDLCALC 106 (H) 03/26/2021   Lab Results  Component Value Date   TRIG 160 (H) 03/26/2021   Lab Results  Component Value Date   CHOLHDL Natalie Mcdaniel.4 03/26/2021   Lab Results  Component Value Date   HGBA1C 5.Natalie Mcdaniel 03/26/2021      Assessment & Plan:   1. Encounter Natalie Mcdaniel Natalie Mcdaniel Natalie Mcdaniel  Natalie Mcdaniel. Natalie Mcdaniel Natalie Mcdaniel screening Natalie Mcdaniel mammogram - MM 3D SCREEN Natalie Mcdaniel BILATERAL; Future  Natalie Mcdaniel. Neuropathy (HCC) - Natalie Mcdaniel (Natalie Mcdaniel) 100 MG capsule; Take 1 capsule (100 mg total) Natalie Mcdaniel mouth Natalie Mcdaniel (three)  times Natalie Mcdaniel.  Natalie Mcdaniel: 90 capsule; Natalie Mcdaniel: Natalie Mcdaniel  4. Natalie Mcdaniel Natalie Mcdaniel  5. Natalie Mcdaniel Natalie Mcdaniel Natalie Mcdaniel Natalie Mcdaniel mellitus - Hemoglobin A1c  Natalie Mcdaniel. Healthcare maintenance - CBC Natalie Mcdaniel Differential - Comprehensive metabolic panel; Future - Lipid Panel - TSH - Natalie Mcdaniel D, 25-hydroxy - Natalie Mcdaniel B12  7. Follow Natalie Mcdaniel Natalie Mcdaniel Natalie Mcdaniel follow Natalie Mcdaniel Natalie Mcdaniel Natalie Mcdaniel Natalie Mcdaniel.    Meds ordered Natalie Mcdaniel encounter  Natalie Mcdaniel  . Natalie Mcdaniel (Natalie Mcdaniel) 100 MG capsule    Natalie Mcdaniel: Take 1 capsule (100 mg total) Natalie Mcdaniel mouth Natalie Mcdaniel (three) times Natalie Mcdaniel.    Natalie Mcdaniel:  90 capsule    Natalie Mcdaniel:  Natalie Mcdaniel    Orders Placed Natalie Mcdaniel Encounter  Procedures  . MM 3D SCREEN Natalie Mcdaniel BILATERAL  . CBC Natalie Mcdaniel Differential  . Comprehensive metabolic panel  . Lipid Panel  . TSH  . Natalie Mcdaniel D, 25-hydroxy  . Natalie Mcdaniel B12  . Hemoglobin A1c    Referral Orders  Natalie Mcdaniel referral(s) requested Natalie Mcdaniel    Kathe Becton, MSN, ANE, FNP-BC [address], [place] [address] [phone] fax    Natalie Mcdaniel List Items Addressed Natalie Mcdaniel Natalie Mcdaniel      Nervous Natalie Mcdaniel Natalie Mcdaniel   Neuropathy ([address])   Relevant Natalie Mcdaniel   Natalie Mcdaniel (Natalie Mcdaniel) 100 MG capsule    Natalie Mcdaniel Natalie Mcdaniel Diagnoses    Healthcare maintenance    -  Primary   Relevant Orders   CBC Natalie Mcdaniel Differential (Completed)   Comprehensive metabolic panel   Lipid Panel (Completed)   TSH (Completed)   Natalie Mcdaniel D, 25-hydroxy (Completed)   Natalie Mcdaniel B12 (Completed)   Encounter Natalie Mcdaniel Natalie Mcdaniel Natalie Mcdaniel       Natalie Mcdaniel Natalie Mcdaniel screening Natalie Mcdaniel mammogram       Relevant Orders   MM 3D SCREEN Natalie Mcdaniel BILATERAL   Natalie Mcdaniel Natalie Mcdaniel       Natalie Mcdaniel Natalie Mcdaniel Natalie Mcdaniel Natalie Mcdaniel mellitus       Relevant Orders   Hemoglobin A1c (Completed)   Follow Natalie Mcdaniel          Meds ordered Natalie Mcdaniel encounter  Natalie Mcdaniel  . Natalie Mcdaniel (Natalie Mcdaniel) 100 MG capsule    Natalie Mcdaniel: Take 1 capsule (100 mg total) Natalie Mcdaniel mouth Natalie Mcdaniel (three) times Natalie Mcdaniel.    Natalie Mcdaniel:  90 capsule    Natalie Mcdaniel:  Natalie Mcdaniel    Follow-Natalie Mcdaniel: Natalie Mcdaniel follow-ups Natalie Mcdaniel Natalie Mcdaniel.    Azzie Glatter, FNP

## 2021-03-27 ENCOUNTER — Telehealth: Payer: Self-pay | Admitting: Family Medicine

## 2021-03-27 LAB — CBC WITH DIFFERENTIAL/PLATELET
Basophils Absolute: 0 10*3/uL (ref 0.0–0.2)
Basos: 0 %
EOS (ABSOLUTE): 0.2 10*3/uL (ref 0.0–0.4)
Eos: 2 %
Hematocrit: 39.2 % (ref 34.0–46.6)
Hemoglobin: 13.1 g/dL (ref 11.1–15.9)
Immature Grans (Abs): 0 10*3/uL (ref 0.0–0.1)
Immature Granulocytes: 0 %
Lymphocytes Absolute: 2.8 10*3/uL (ref 0.7–3.1)
Lymphs: 31 %
MCH: 30.5 pg (ref 26.6–33.0)
MCHC: 33.4 g/dL (ref 31.5–35.7)
MCV: 91 fL (ref 79–97)
Monocytes Absolute: 0.6 10*3/uL (ref 0.1–0.9)
Monocytes: 6 %
Neutrophils Absolute: 5.5 10*3/uL (ref 1.4–7.0)
Neutrophils: 61 %
Platelets: 304 10*3/uL (ref 150–450)
RBC: 4.3 x10E6/uL (ref 3.77–5.28)
RDW: 12.6 % (ref 11.7–15.4)
WBC: 9.1 10*3/uL (ref 3.4–10.8)

## 2021-03-27 LAB — HEMOGLOBIN A1C
Est. average glucose Bld gHb Est-mCnc: 114 mg/dL
Hgb A1c MFr Bld: 5.6 % (ref 4.8–5.6)

## 2021-03-27 LAB — VITAMIN D 25 HYDROXY (VIT D DEFICIENCY, FRACTURES): Vit D, 25-Hydroxy: 18.5 ng/mL — ABNORMAL LOW (ref 30.0–100.0)

## 2021-03-27 LAB — LIPID PANEL
Chol/HDL Ratio: 2.4 ratio (ref 0.0–4.4)
Cholesterol, Total: 229 mg/dL — ABNORMAL HIGH (ref 100–199)
HDL: 96 mg/dL (ref >39)
LDL Chol Calc (NIH): 106 mg/dL — ABNORMAL HIGH (ref 0–99)
Triglycerides: 160 mg/dL — ABNORMAL HIGH (ref 0–149)
VLDL Cholesterol Cal: 27 mg/dL (ref 5–40)

## 2021-03-27 LAB — TSH: TSH: 0.987 u[IU]/mL (ref 0.450–4.500)

## 2021-03-27 LAB — VITAMIN B12: Vitamin B-12: 173 pg/mL — ABNORMAL LOW (ref 232–1245)

## 2021-03-27 NOTE — Telephone Encounter (Signed)
Attempted to contact patient today to discuss recent lab results. Message left for patient to contact office.

## 2021-03-28 ENCOUNTER — Telehealth: Payer: Self-pay

## 2021-03-28 NOTE — Telephone Encounter (Signed)
Patient returned call about results.  

## 2021-04-02 ENCOUNTER — Encounter: Payer: Self-pay | Admitting: Family Medicine

## 2021-04-04 ENCOUNTER — Ambulatory Visit: Payer: Self-pay | Attending: Family Medicine

## 2021-04-04 ENCOUNTER — Other Ambulatory Visit: Payer: Self-pay

## 2021-04-04 NOTE — Telephone Encounter (Signed)
Error

## 2021-04-16 ENCOUNTER — Telehealth: Payer: Self-pay | Admitting: Family Medicine

## 2021-04-16 NOTE — Telephone Encounter (Signed)
Pt was sent a letter from financial dept. Inform them, that the application they submitted was incomplete, since they were missing some documentation at the time of the appointment, Pt need to reschedule and resubmit all new papers and application for [employer] and [place], P.S. old documents has been sent back by mail to the Pt and Pt. need to make a new appt. 

## 2021-04-17 NOTE — Telephone Encounter (Signed)
Error

## 2021-05-23 ENCOUNTER — Other Ambulatory Visit: Payer: Self-pay

## 2021-05-23 ENCOUNTER — Encounter (INDEPENDENT_AMBULATORY_CARE_PROVIDER_SITE_OTHER): Payer: Self-pay

## 2021-05-23 ENCOUNTER — Ambulatory Visit: Payer: Self-pay | Attending: Family Medicine

## 2021-06-26 ENCOUNTER — Ambulatory Visit: Payer: Self-pay | Admitting: Nurse Practitioner

## 2021-10-01 ENCOUNTER — Other Ambulatory Visit: Payer: Self-pay

## 2021-10-01 ENCOUNTER — Other Ambulatory Visit (HOSPITAL_COMMUNITY): Payer: Self-pay

## 2021-10-01 MED ORDER — LATANOPROST 0.005 % OP SOLN
OPHTHALMIC | 0 refills | Status: DC
  Filled 2021-10-01: qty 2.5, 40d supply, fill #0

## 2021-10-01 MED ORDER — LATANOPROST 0.005 % OP SOLN
OPHTHALMIC | 0 refills | Status: DC
  Filled 2021-10-01: qty 7.5, 90d supply, fill #0

## 2021-10-07 ENCOUNTER — Other Ambulatory Visit: Payer: Self-pay

## 2022-06-02 ENCOUNTER — Emergency Department (HOSPITAL_COMMUNITY)
Admission: EM | Admit: 2022-06-02 | Discharge: 2022-06-02 | Disposition: A | Discharge status: Home / Self Care | Payer: 59 | Attending: Emergency Medicine | Admitting: Emergency Medicine

## 2022-06-02 ENCOUNTER — Other Ambulatory Visit: Payer: Self-pay

## 2022-06-02 ENCOUNTER — Encounter (HOSPITAL_COMMUNITY): Payer: Self-pay | Admitting: Emergency Medicine

## 2022-06-02 DIAGNOSIS — F10939 Alcohol use, unspecified with withdrawal, unspecified: Secondary | ICD-10-CM | POA: Diagnosis present

## 2022-06-02 DIAGNOSIS — R718 Other abnormality of red blood cells: Secondary | ICD-10-CM | POA: Diagnosis not present

## 2022-06-02 DIAGNOSIS — D72829 Elevated white blood cell count, unspecified: Secondary | ICD-10-CM | POA: Diagnosis not present

## 2022-06-02 DIAGNOSIS — R944 Abnormal results of kidney function studies: Secondary | ICD-10-CM | POA: Diagnosis not present

## 2022-06-02 DIAGNOSIS — Y901 Blood alcohol level of 20-39 mg/100 ml: Secondary | ICD-10-CM | POA: Diagnosis not present

## 2022-06-02 DIAGNOSIS — R7401 Elevation of levels of liver transaminase levels: Secondary | ICD-10-CM | POA: Diagnosis not present

## 2022-06-02 DIAGNOSIS — Z7141 Alcohol abuse counseling and surveillance of alcoholic: Secondary | ICD-10-CM

## 2022-06-02 LAB — CBC WITH DIFFERENTIAL/PLATELET
Abs Immature Granulocytes: 0.05 10*3/uL (ref 0.00–0.07)
Basophils Absolute: 0.1 10*3/uL (ref 0.0–0.1)
Basophils Relative: 1 %
Eosinophils Absolute: 0 10*3/uL (ref 0.0–0.5)
Eosinophils Relative: 0 %
HCT: 46.8 % — ABNORMAL HIGH (ref 36.0–46.0)
Hemoglobin: 15.2 g/dL — ABNORMAL HIGH (ref 12.0–15.0)
Immature Granulocytes: 0 %
Lymphocytes Relative: 30 %
Lymphs Abs: 3.8 10*3/uL (ref 0.7–4.0)
MCH: 30.4 pg (ref 26.0–34.0)
MCHC: 32.5 g/dL (ref 30.0–36.0)
MCV: 93.6 fL (ref 80.0–100.0)
Monocytes Absolute: 0.8 10*3/uL (ref 0.1–1.0)
Monocytes Relative: 6 %
Neutro Abs: 8 10*3/uL — ABNORMAL HIGH (ref 1.7–7.7)
Neutrophils Relative %: 63 %
Platelets: 338 10*3/uL (ref 150–400)
RBC: 5 MIL/uL (ref 3.87–5.11)
RDW: 14.1 % (ref 11.5–15.5)
WBC: 12.7 10*3/uL — ABNORMAL HIGH (ref 4.0–10.5)
nRBC: 0 % (ref 0.0–0.2)

## 2022-06-02 LAB — RAPID URINE DRUG SCREEN, HOSP PERFORMED
Amphetamines: NOT DETECTED
Barbiturates: NOT DETECTED
Benzodiazepines: NOT DETECTED
Cocaine: NOT DETECTED
Opiates: NOT DETECTED
Tetrahydrocannabinol: NOT DETECTED

## 2022-06-02 LAB — LIPASE, BLOOD: Lipase: 25 U/L (ref 11–51)

## 2022-06-02 LAB — COMPREHENSIVE METABOLIC PANEL
ALT: 21 U/L (ref 0–44)
AST: 26 U/L (ref 15–41)
Albumin: 4.4 g/dL (ref 3.5–5.0)
Alkaline Phosphatase: 147 U/L — ABNORMAL HIGH (ref 38–126)
Anion gap: 11 (ref 5–15)
BUN: 19 mg/dL (ref 6–20)
CO2: 23 mmol/L (ref 22–32)
Calcium: 9.1 mg/dL (ref 8.9–10.3)
Chloride: 104 mmol/L (ref 98–111)
Creatinine, Ser: 1.08 mg/dL — ABNORMAL HIGH (ref 0.44–1.00)
GFR, Estimated: 60 mL/min — ABNORMAL LOW (ref >60)
Glucose, Bld: 115 mg/dL — ABNORMAL HIGH (ref 70–99)
Potassium: 4.5 mmol/L (ref 3.5–5.1)
Sodium: 138 mmol/L (ref 135–145)
Total Bilirubin: 0.5 mg/dL (ref 0.3–1.2)
Total Protein: 8 g/dL (ref 6.5–8.1)

## 2022-06-02 LAB — ETHANOL: Alcohol, Ethyl (B): 37 mg/dL — ABNORMAL HIGH (ref <10)

## 2022-06-02 LAB — SALICYLATE LEVEL: Salicylate Lvl: <7 mg/dL — ABNORMAL LOW (ref 7.0–30.0)

## 2022-06-02 LAB — ACETAMINOPHEN LEVEL: Acetaminophen (Tylenol), Serum: <10 ug/mL — ABNORMAL LOW (ref 10–30)

## 2022-06-02 NOTE — ED Provider Triage Note (Signed)
Emergency Medicine Provider Triage Evaluation Note  DOT SPLINTER , a 56 y.o. female  was evaluated in triage.  Pt complains of questing alcohol withdrawal.  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  Patient states she lives in sober living and they will not take her back due to her recent alcohol use.  She denies any pain.  Has had some occasional nausea.  Her last alcohol use was yesterday.  No tremors.  He is unsure if she has history of DTs or withdrawal seizures.  Last drink was sometime yesterday approximately 2-3 bottles of wine.  States she has chronic tingling and numbness to her feet which is unchanged.  No recent falls or injury.  Review of Systems  Positive: Nausea, tingling Negative:   Physical Exam  BP 100/72   Pulse 87   Temp 98.4 F (36.9 C) (Oral)   Resp 16   Ht 5' 5.5" (1.664 m)   Wt 89.8 kg   LMP 08/31/2013   SpO2 100%   BMI 32.45 kg/m  Gen:   Awake, no distress   Resp:  Normal effort  MSK:   Moves extremities without difficulty  Other:  No tremors, CIWA 1  Medical Decision Making  Medically screening exam initiated at 3:13 PM.  Appropriate orders placed.  EMMALEAH MERONEY was informed that the remainder of the evaluation will be completed by another provider, this initial triage assessment does not replace that evaluation, and the importance of remaining in the ED until their evaluation is complete.  Etoh use   Nafeesa Dils A, PA-C 06/02/22 1514

## 2022-06-02 NOTE — Discharge Instructions (Signed)
I have attached the guide to shelters in the area as well as substance abuse and rehab facilities.

## 2022-06-02 NOTE — ED Provider Notes (Signed)
[facility] DEPT Provider Note   CSN: [id number] Arrival date & time: 06/02/22  1411     History  Chief Complaint  Patient presents with   Detox    Natalie Mcdaniel is a 56 y.o. female with past medical history seen for depression, alcohol dependence Who has been staying at [facility] reports that she was told she needs to be detoxed.  She had 2 bottles of wine last night.  She does report history of alcohol abuse, reports that the last time she was able to make some and was around a month ago.  At this time she is in no acute distress, but somewhat tearful because she said that she will be homeless if she cannot return to her facility.  HPI     Home Medications Prior to Admission medications   Medication Sig Start Date End Date Taking? Authorizing Provider  gabapentin (NEURONTIN) 100 MG capsule Take 1 capsule (100 mg total) by mouth 3 (three) times daily. 03/26/21   Azzie Glatter, FNP  latanoprost (XALATAN) 0.005 % ophthalmic solution instill 1 drop into affected eye(s) once daily in the evening 10/01/21         Allergies    Bee venom and Wasp venom    Review of Systems   Review of Systems  All other systems reviewed and are negative.   Physical Exam Updated Vital Signs BP 91/76   Pulse 70   Temp 98.4 F (36.9 C) (Oral)   Resp 18   Ht 5' 5.5" (1.664 m)   Wt 89.8 kg   LMP 08/31/2013   SpO2 100%   BMI 32.45 kg/m  Physical Exam Vitals and nursing note reviewed.  Constitutional:      General: She is not in acute distress.    Appearance: Normal appearance.  HENT:     Head: Normocephalic and atraumatic.  Eyes:     General:        Right eye: No discharge.        Left eye: No discharge.  Cardiovascular:     Rate and Rhythm: Normal rate and regular rhythm.  Pulmonary:     Effort: Pulmonary effort is normal. No respiratory distress.  Musculoskeletal:        General: No deformity.  Skin:    General: Skin is warm and dry.   Neurological:     Mental Status: She is alert and oriented to person, place, and time.     Comments: No tremor, no acute distress.  Patient denies headache.  She is otherwise alert and oriented x3.  She is ambulatory without difficulty.  Psychiatric:        Mood and Affect: Mood normal.        Behavior: Behavior normal.     ED Results / Procedures / Treatments   Labs (all labs ordered are listed, but only abnormal results are displayed) Labs Reviewed  CBC WITH DIFFERENTIAL/PLATELET - Abnormal; Notable for the following components:      Result Value   WBC 12.7 (*)    Hemoglobin 15.2 (*)    HCT 46.8 (*)    Neutro Abs 8.0 (*)    All other components within normal limits  COMPREHENSIVE METABOLIC PANEL - Abnormal; Notable for the following components:   Glucose, Bld 115 (*)    Creatinine, Ser 1.08 (*)    Alkaline Phosphatase 147 (*)    GFR, Estimated 60 (*)    All other components within normal limits  ETHANOL -  Abnormal; Notable for the following components:   Alcohol, Ethyl (B) 37 (*)    All other components within normal limits  SALICYLATE LEVEL - Abnormal; Notable for the following components:   Salicylate Lvl <0.0 (*)    All other components within normal limits  ACETAMINOPHEN LEVEL - Abnormal; Notable for the following components:   Acetaminophen (Tylenol), Serum <10 (*)    All other components within normal limits  LIPASE, BLOOD  RAPID URINE DRUG SCREEN, HOSP PERFORMED    EKG None  Radiology No results found.  Procedures Procedures    Medications Ordered in ED Medications - No data to display  ED Course/ Medical Decision Making/ A&P                           Medical Decision Making  This an overall well-appearing 56 year old female who presents with concern for need for detox after being kicked out of  [facility].  She had 2 bottles of alcohol last night. Previous hx of alcohol abuse but no drinks since one month prior to last night.  Patient  nontremulous, low CIWA score on physical exam.  Low clinical concern for acute alcohol withdrawal, delirium tremens.  Plan reviewed lab work including UDS, Tylenol level, salicylate, lipase which are unremarkable.  CMP notable for mildly elevated creatinine, alkaline phosphatase.  No significant changes from baseline.  Ethanol level is mildly elevated at 37 is consistent with lingering alcohol inpatient system after drinking last night.  CBC notable for mild leukocytosis, white blood cells 12.7.  Hemoglobin also mildly elevated.  There may be some degree of hemoconcentration, however discussed that patient may also be under some stress secondary to housing situation.  Patient showing no signs of active withdrawal, or other drug use at this time.  She has stable vital signs.  She is stable for discharge, we will provide her with substance abuse resources and housing resources. Final Clinical Impression(s) / ED Diagnoses Final diagnoses:  None    Rx / DC Orders ED Discharge Orders     None         Dorien Chihuahua 06/02/22 Mervyn Gay, MD 06/03/22 0003

## 2022-06-02 NOTE — ED Triage Notes (Signed)
Patient states she was dropped by [facility] and told she needs to be detoxed. Patient reports last drink was sometimes yesterday and had approx 2 bottles of wine. Only c/o tingling and numbness in feet that pt states is chronic. Patient states she is homeless due to [employer] not allowing her to come back.

## 2022-07-01 ENCOUNTER — Telehealth: Payer: Self-pay | Admitting: Nurse Practitioner

## 2022-07-01 NOTE — Telephone Encounter (Signed)
Care Everywhere records

## 2022-07-10 ENCOUNTER — Other Ambulatory Visit (HOSPITAL_BASED_OUTPATIENT_CLINIC_OR_DEPARTMENT_OTHER): Payer: Self-pay | Admitting: Nurse Practitioner

## 2022-07-10 DIAGNOSIS — Z1231 Encounter for screening mammogram for malignant neoplasm of breast: Secondary | ICD-10-CM

## 2022-07-15 ENCOUNTER — Ambulatory Visit (HOSPITAL_BASED_OUTPATIENT_CLINIC_OR_DEPARTMENT_OTHER)
Admission: RE | Admit: 2022-07-15 | Discharge: 2022-07-15 | Disposition: A | Discharge status: Home / Self Care | Payer: 59 | Source: Ambulatory Visit | Attending: Nurse Practitioner | Admitting: Nurse Practitioner

## 2022-07-15 ENCOUNTER — Encounter (HOSPITAL_BASED_OUTPATIENT_CLINIC_OR_DEPARTMENT_OTHER): Payer: Self-pay

## 2022-07-15 DIAGNOSIS — Z1231 Encounter for screening mammogram for malignant neoplasm of breast: Secondary | ICD-10-CM | POA: Diagnosis present

## 2022-07-26 ENCOUNTER — Other Ambulatory Visit (INDEPENDENT_AMBULATORY_CARE_PROVIDER_SITE_OTHER)
Admission: EM | Admit: 2022-07-26 | Discharge: 2022-08-04 | Disposition: A | Discharge status: Home / Self Care | Payer: 59 | Source: Home / Self Care | Admitting: Psychiatry

## 2022-07-26 ENCOUNTER — Emergency Department (HOSPITAL_COMMUNITY)
Admission: EM | Admit: 2022-07-26 | Discharge: 2022-07-26 | Disposition: A | Discharge status: Home / Self Care | Payer: 59 | Attending: Emergency Medicine | Admitting: Emergency Medicine

## 2022-07-26 ENCOUNTER — Other Ambulatory Visit: Payer: Self-pay

## 2022-07-26 ENCOUNTER — Encounter (HOSPITAL_COMMUNITY): Payer: Self-pay

## 2022-07-26 DIAGNOSIS — G621 Alcoholic polyneuropathy: Secondary | ICD-10-CM | POA: Insufficient documentation

## 2022-07-26 DIAGNOSIS — F332 Major depressive disorder, recurrent severe without psychotic features: Secondary | ICD-10-CM | POA: Insufficient documentation

## 2022-07-26 DIAGNOSIS — Z811 Family history of alcohol abuse and dependence: Secondary | ICD-10-CM | POA: Insufficient documentation

## 2022-07-26 DIAGNOSIS — M79671 Pain in right foot: Secondary | ICD-10-CM | POA: Insufficient documentation

## 2022-07-26 DIAGNOSIS — D72829 Elevated white blood cell count, unspecified: Secondary | ICD-10-CM | POA: Insufficient documentation

## 2022-07-26 DIAGNOSIS — F1010 Alcohol abuse, uncomplicated: Secondary | ICD-10-CM | POA: Diagnosis not present

## 2022-07-26 DIAGNOSIS — R197 Diarrhea, unspecified: Secondary | ICD-10-CM | POA: Diagnosis not present

## 2022-07-26 DIAGNOSIS — R112 Nausea with vomiting, unspecified: Secondary | ICD-10-CM | POA: Diagnosis present

## 2022-07-26 DIAGNOSIS — F419 Anxiety disorder, unspecified: Secondary | ICD-10-CM | POA: Insufficient documentation

## 2022-07-26 DIAGNOSIS — Z7984 Long term (current) use of oral hypoglycemic drugs: Secondary | ICD-10-CM | POA: Insufficient documentation

## 2022-07-26 DIAGNOSIS — Z20822 Contact with and (suspected) exposure to covid-19: Secondary | ICD-10-CM | POA: Insufficient documentation

## 2022-07-26 DIAGNOSIS — M79672 Pain in left foot: Secondary | ICD-10-CM | POA: Diagnosis not present

## 2022-07-26 DIAGNOSIS — R1084 Generalized abdominal pain: Secondary | ICD-10-CM | POA: Insufficient documentation

## 2022-07-26 DIAGNOSIS — K760 Fatty (change of) liver, not elsewhere classified: Secondary | ICD-10-CM | POA: Insufficient documentation

## 2022-07-26 DIAGNOSIS — Z9884 Bariatric surgery status: Secondary | ICD-10-CM | POA: Insufficient documentation

## 2022-07-26 DIAGNOSIS — Z62810 Personal history of physical and sexual abuse in childhood: Secondary | ICD-10-CM | POA: Insufficient documentation

## 2022-07-26 DIAGNOSIS — E512 Wernicke's encephalopathy: Secondary | ICD-10-CM

## 2022-07-26 DIAGNOSIS — F1026 Alcohol dependence with alcohol-induced persisting amnestic disorder: Secondary | ICD-10-CM | POA: Insufficient documentation

## 2022-07-26 DIAGNOSIS — F04 Amnestic disorder due to known physiological condition: Secondary | ICD-10-CM

## 2022-07-26 DIAGNOSIS — Z79899 Other long term (current) drug therapy: Secondary | ICD-10-CM | POA: Insufficient documentation

## 2022-07-26 DIAGNOSIS — F1024 Alcohol dependence with alcohol-induced mood disorder: Secondary | ICD-10-CM | POA: Diagnosis present

## 2022-07-26 DIAGNOSIS — R202 Paresthesia of skin: Secondary | ICD-10-CM | POA: Diagnosis not present

## 2022-07-26 DIAGNOSIS — Z638 Other specified problems related to primary support group: Secondary | ICD-10-CM | POA: Insufficient documentation

## 2022-07-26 LAB — COMPREHENSIVE METABOLIC PANEL
ALT: 27 U/L (ref 0–44)
AST: 50 U/L — ABNORMAL HIGH (ref 15–41)
Albumin: 4.2 g/dL (ref 3.5–5.0)
Alkaline Phosphatase: 143 U/L — ABNORMAL HIGH (ref 38–126)
Anion gap: 17 — ABNORMAL HIGH (ref 5–15)
BUN: 17 mg/dL (ref 6–20)
CO2: 21 mmol/L — ABNORMAL LOW (ref 22–32)
Calcium: 8.7 mg/dL — ABNORMAL LOW (ref 8.9–10.3)
Chloride: 94 mmol/L — ABNORMAL LOW (ref 98–111)
Creatinine, Ser: 0.87 mg/dL (ref 0.44–1.00)
GFR, Estimated: >60 mL/min (ref >60)
Glucose, Bld: 99 mg/dL (ref 70–99)
Potassium: 3.8 mmol/L (ref 3.5–5.1)
Sodium: 132 mmol/L — ABNORMAL LOW (ref 135–145)
Total Bilirubin: 1.1 mg/dL (ref 0.3–1.2)
Total Protein: 7.4 g/dL (ref 6.5–8.1)

## 2022-07-26 LAB — CBC
HCT: 40.5 % (ref 36.0–46.0)
Hemoglobin: 13.7 g/dL (ref 12.0–15.0)
MCH: 30.4 pg (ref 26.0–34.0)
MCHC: 33.8 g/dL (ref 30.0–36.0)
MCV: 90 fL (ref 80.0–100.0)
Platelets: 305 10*3/uL (ref 150–400)
RBC: 4.5 MIL/uL (ref 3.87–5.11)
RDW: 13.9 % (ref 11.5–15.5)
WBC: 12 10*3/uL — ABNORMAL HIGH (ref 4.0–10.5)
nRBC: 0 % (ref 0.0–0.2)

## 2022-07-26 LAB — LIPASE, BLOOD: Lipase: 30 U/L (ref 11–51)

## 2022-07-26 LAB — ETHANOL: Alcohol, Ethyl (B): <10 mg/dL (ref <10)

## 2022-07-26 MED ORDER — LORAZEPAM 1 MG PO TABS
2.0000 mg | ORAL_TABLET | Freq: Once | ORAL | Status: AC
  Administered 2022-07-26: 2 mg via ORAL
  Filled 2022-07-26: qty 2

## 2022-07-26 MED ORDER — CHLORDIAZEPOXIDE HCL 25 MG PO CAPS: ORAL_CAPSULE | ORAL | 0 refills | Status: DC

## 2022-07-26 MED ORDER — DEXTROSE 50 % IV SOLN: 1.0000 | Freq: Once | INTRAVENOUS | Status: DC

## 2022-07-26 MED ORDER — DEXTROSE 5 % IV BOLUS
1000.0000 mL | Freq: Once | INTRAVENOUS | Status: AC
  Administered 2022-07-26: 1000 mL via INTRAVENOUS

## 2022-07-26 MED ORDER — ONDANSETRON HCL 4 MG/2ML IJ SOLN
4.0000 mg | Freq: Once | INTRAMUSCULAR | Status: AC
  Administered 2022-07-26: 4 mg via INTRAVENOUS
  Filled 2022-07-26: qty 2

## 2022-07-26 MED ORDER — SODIUM CHLORIDE 0.9 % IV BOLUS
1000.0000 mL | Freq: Once | INTRAVENOUS | Status: AC
  Administered 2022-07-26: 1000 mL via INTRAVENOUS

## 2022-07-26 MED ORDER — ONDANSETRON 4 MG PO TBDP: 4.0000 mg | ORAL_TABLET | Freq: Three times a day (TID) | ORAL | 0 refills | Status: DC | PRN

## 2022-07-26 NOTE — ED Triage Notes (Addendum)
Pt arrived POV stating she thinks she is dying. Pt states she has neuropathy and drinks even though she should not and her feet are killing her because she has not taken her gabapentin in days. Pt states her feet hurt so bad she wants [place] to cut them off.

## 2022-07-26 NOTE — TOC Progression Note (Signed)
Transition of Care [facility]) - Progression Note    Patient Details  Name: Natalie Mcdaniel MRN: [id number] Date of Birth: [birth year]  Transition of Care [facility]) CM/SW [place], [place] Phone Number: 07/26/2022, 4:29 PM  Clinical Narrative:    TOC received consult for SUD/homelessness. CSW added resources in patient's chart and notes limited homelessness supports on the weekend and with insurance significant barriers for SUD treatment outside of [facility] or other local short term acute detoxification programs.         Expected Discharge Plan and Services                                                 Social Determinants of Health (SDOH) Interventions    Readmission Risk Interventions     No data to display
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
## 2022-07-26 NOTE — Discharge Instructions (Addendum)
You came to the emergency department today to be evaluated for your nausea and vomiting.  Your lab results and physical exam are reassuring.  Your nausea vomiting improved after receiving antinausea medicine.  I have given you prescription of the same nausea medicine which you may take every 8 hours to help with your nausea and vomiting.  You expressed a want to stop drinking.  Due to this I have given you prescription for Librium.  Please pick up this medication and take as prescribed.  Additionally please follow-up with resources in the outpatient setting for substance use counseling.  Please follow-up with [place] and wellness clinic for management of your chronic medical issues such as your neuropathy.  Get help right away if: You have pain in your chest, neck, arm, or jaw. You feel extremely weak or you faint. You have persistent vomiting. You have vomit that is bright red or looks like black coffee grounds. You have bloody or black stools (feces) or stools that look like tar. You have a severe headache, a stiff neck, or both. You have severe pain, cramping, or bloating in your abdomen. You have difficulty breathing, or you are breathing very quickly. Your heart is beating very quickly. Your skin feels cold and clammy. You feel confused. You have signs of dehydration, such as: Dark urine, very little urine, or no urine. Cracked lips. Dry mouth. Sunken eyes. Sleepiness. Weakness. You have thoughts about hurting yourself or others.                  Intensive Outpatient Programs  [facility]    [facility] [place] [address]     [place] [address] [place],  [place], [place]      [place]  (Inpatient and outpatient)  [phone] (Suboxone and Methadone) [address]           [phone]           ADS: Alcohol & Drug  Services    [facility] - Intensive Outpatient [address]     [address] [place], [place] [address]     [place], [place]      [phone]  [facility] [place] (Outpatient, Inpatient, Chemical  Caring [facility] (Groups and [place]idental) (insurance only) [phone]    [place], [place]          [phone]       [facility]    [facility] (for caregivers and family) [address]     [address] [place], [place], [place]      [phone]  Residential Treatment Programs  [place]  [place](2 years) Residential: 53 days)  [facility] ([place], [place], [place] [phone]      [phone] or [phone]  [facility]    The [facility] [address]      [place], [place], [place]      [phone]  [place]   Residential Treatment Services (RTS) [place]     [address] [place], [place] [address]     [place], [place]  [phone]      [phone] Admissions: 8am-3pm M-F  [facility] Program: Residential Program [phone] Days)              [facility]: [place], [place], [place] or [phone]    (Walk in Hours over the weekend or by referral)   [place] Crisis: [facility]:[phone] (for crisis response 24 hours a day)   If you are in need of a shelter please call [facility] ([place]) at [phone] between the hours of 9am-5pm Mon-Fri.  [facility] will contact all the local shelters to find openings.  Right now they are requiring people to quarantine at a hotel before they can go to an open bed but [facility] may be able to set that up as well.  They are not open on weekends.  On Monday-Friday morning at 8 am until 1 pm, you can  also go to the [employer] (see below) to seek shelter in the [facility] prior to entering a shelter. You can also call the number provided (see the above paragraph) to seek placement into the program by calling [facility].   Interactive resource center [facility]) [place], [place] [address] Phone: [phone] Fax: [phone]  For Free Breakfasts and Lunches 7 days a week you can go to: [facility] [address] [place], [place], [place] [address] Hours: Open today  8AM-5PM Phone: [phone] Breakfast: 6:30-7:30 am Lunch served: 10:40 am - 12:40pm         [facility]) M-F 8am-3pm   [address], [place] [address]   [phone] Services include: laundry, barbering, support groups, case management, phone  & computer access, showers, AA/NA mtgs, mental health/substance abuse nurse, job skills class, disability information, VA assistance, spiritual classes, etc.   [facility] [place], [place] Provides breakfast each weekday morning except Wednesdays, and an evening community meal every Friday. Access to showers is available during breakfast hours and telephones for seeking work are also provided. Also offers job referral and counseling for the homeless and unemployed.  [facility]OMELESS SHELTERS [facility] [facility]   [place], [place] [address]     [address], [place]  [phone]      [phone]  [facility] [place] [address]    [address] [place] [address]     [place], [place] [address] [phone]       [phone]  [facility] (women only) [address] [place], [place] [address] [place]: [place]  (overflow shelter: [place], [place] check in at 6pm for placement in local shelter).  [place], [place], [place] [address] Phone: [place]     [place]      [facility] [phone]. [place], [place] [address]     [place], [place] [address]    [facility] Mobile Crisis Management - [phone]

## 2022-07-26 NOTE — ED Notes (Signed)
Pt provided with crackers and water. Pt able to tolerate both with no nausea and vomitting

## 2022-07-26 NOTE — ED Notes (Signed)
Unable to collect per pt at

## 2022-07-26 NOTE — Progress Notes (Signed)
   07/26/22 2215  [place] (Walk-ins at [facility] only)  How Did You Hear About [place]? Family/Friend  What Is the Reason for Your Visit/Call Today? Pt presented to [facility] today for alcohol use and was medically cleared and discharged. Pt says she was in a sober living house but cannot return because she returned to drinking alcohol. She reports drinking approximately 3 bottles of wine daily. She denies use of substances other than alcohol. She reports depressive symptoms and anxiety. She endorses passive suicidal ideation with no plan. She denies homicidal ideation or psychotic symptoms. She does not have a permanent residence and is requesting treatment.  How Long Has This Been Causing You Problems? > than 6 months  Have You Recently Had Any Thoughts About Hurting Yourself? No  Are You Planning to Commit Suicide/Harm Yourself At This time? No  Have you Recently Had Thoughts About [place]? No  Are You Planning To Harm Someone At This Time? No  Are you currently experiencing any auditory, visual or other hallucinations? No  Have You Used Any Alcohol or Drugs in the Past 24 Hours? Yes  How long ago did you use Drugs or Alcohol? Yesterday  What Did You Use and How Much? 1-2 bottle of wine  Do you have any current medical co-morbidities that require immediate attention? No  Clinician description of patient physical appearance/behavior: Pt is casually dressed, alert and oriented x4. Pt speaks in a soft tone, at moderate volume and normal pace. Motor behavior appears normal except Pt walks slowly due to reported neuropathy in her feet. Eye contact is good. Pt's mood is depressed and affect is congruent with mood. Thought process is coherent and relevant. There is no indication Pt is currently responding to internal stimuli or experiencing delusional thought content. She is cooperative.  What Do You Feel Would Help You the Most Today? Alcohol or Drug Use Treatment;Treatment for Depression or  other mood problem  If access to [facility] was not available, would you have sought care in the Emergency Department? Yes  Determination of Need Urgent (48 hours)  Options For Referral Facility-Based Crisis;BH Urgent Care;Outpatient Therapy;Medication Management

## 2022-07-26 NOTE — ED Provider Notes (Signed)
[facility] EMERGENCY DEPARTMENT Provider Note   CSN: [id number] Arrival date & time: 07/26/22  1352     History  Chief Complaint  Patient presents with   Foot Pain   Emesis    Natalie Mcdaniel is a 56 y.o. female with a history of alcohol use, obesity, anxiety, depression, anemia, status post Roux-en-Y gastric bypass 2003.  Presents to the emergency department with a complaint of bilateral foot pain, nausea, vomiting and abdominal cramping.  Patient reports that she has been drinking approximately 18 beers daily for the last 4 months.  Patient reports that she started drinking again because her husband was supportive in her daughter was arrested.  Patient states that she had her last drink on Thursday.  Patient told RN that she stopped drinking 4 days prior.  Patient expresses desire to stop drinking.  Patient reports that since she has stopped drinking she has had persistent nausea, vomiting, and generalized abdominal cramping.  Patient reports reports that she is vomiting multiple times in the last 24 hours however was unable to a specific number to it.  Patient describes emesis as stomach contents and bilious.  Patient reports decreased p.o. intake due to her nausea and vomiting.  Patient reports that she has had generalized cramping throughout her entire abdomen.  Patient also reports that she has been having diarrhea since she stopped drinking as well.  Endorses bowel movements today.  Reports that she has been passing flatus without difficulty.  Patient also reports that she is having pain to bilateral feet.  Patient describes pain as burning and tingling sensation.  Patient reports that she has history of neuropathy and has not been taking her gabapentin.  Patient denies any illicit drug use.  Patient reports that she has been living out of her car.  Denies any fever, chills, vaginal pain, vaginal bleeding, vaginal discharge, dysuria, hematuria, urinary urgency,  urinary frequency, blood in stool, melena, constipation, abdominal distention, suicidal ideation, homicidal ideation, auditory hallucination, visual hallucination.     Foot Pain Associated symptoms include abdominal pain. Pertinent negatives include no chest pain, no headaches and no shortness of breath.  Emesis Associated symptoms: abdominal pain and diarrhea   Associated symptoms: no chills, no fever and no headaches        Home Medications Prior to Admission medications   Medication Sig Start Date End Date Taking? Authorizing Provider  gabapentin (NEURONTIN) 100 MG capsule Take 1 capsule (100 mg total) by mouth 3 (three) times daily. 03/26/21   Azzie Glatter, FNP  latanoprost (XALATAN) 0.005 % ophthalmic solution instill 1 drop into affected eye(s) once daily in the evening 10/01/21         Allergies    Bee venom and Wasp venom    Review of Systems   Review of Systems  Constitutional:  Negative for chills and fever.  Eyes:  Negative for visual disturbance.  Respiratory:  Negative for shortness of breath.   Cardiovascular:  Negative for chest pain.  Gastrointestinal:  Positive for abdominal pain, diarrhea, nausea and vomiting. Negative for abdominal distention, anal bleeding, blood in stool, constipation and rectal pain.  Genitourinary:  Negative for difficulty urinating, dysuria, flank pain, frequency, genital sores, hematuria, pelvic pain, urgency, vaginal bleeding, vaginal discharge and vaginal pain.  Musculoskeletal:  Negative for back pain and neck pain.  Skin:  Negative for color change and rash.  Neurological:  Negative for dizziness, syncope, light-headedness and headaches.  Psychiatric/Behavioral:  Negative for confusion and suicidal ideas.  Physical Exam Updated Vital Signs BP 106/84   Pulse 80   Temp 99.3 F (37.4 C) (Oral)   Resp 18   Ht 5' 6" (1.676 m)   Wt 90.7 kg   LMP 08/31/2013   SpO2 98%   BMI 32.28 kg/m  Physical Exam Vitals and nursing  note reviewed.  Constitutional:      General: She is not in acute distress.    Appearance: She is not ill-appearing, toxic-appearing or diaphoretic.  HENT:     Head: Normocephalic.  Eyes:     General: No scleral icterus.       Right eye: No discharge.        Left eye: No discharge.  Cardiovascular:     Rate and Rhythm: Normal rate.  Pulmonary:     Effort: Pulmonary effort is normal. No tachypnea, bradypnea or respiratory distress.     Breath sounds: Normal breath sounds. No stridor.  Abdominal:     General: Abdomen is flat. A surgical scar is present. Bowel sounds are normal. There is no distension. There are no signs of injury.     Palpations: Abdomen is soft. There is no mass or pulsatile mass.     Tenderness: There is no abdominal tenderness. There is no guarding or rebound.     Hernia: There is no hernia in the umbilical area or ventral area.  Skin:    General: Skin is warm and dry.  Neurological:     General: No focal deficit present.     Mental Status: She is alert.     GCS: GCS eye subscore is 4. GCS verbal subscore is 5. GCS motor subscore is 6.     Motor: No tremor.  Psychiatric:        Attention and Perception: She is attentive. She does not perceive auditory or visual hallucinations.        Mood and Affect: Mood is anxious.        Behavior: Behavior is cooperative.        Thought Content: Thought content is not paranoid or delusional. Thought content does not include homicidal or suicidal ideation. Thought content does not include homicidal or suicidal plan.     ED Results / Procedures / Treatments   Labs (all labs ordered are listed, but only abnormal results are displayed) Labs Reviewed  COMPREHENSIVE METABOLIC PANEL - Abnormal; Notable for the following components:      Result Value   Sodium 132 (*)    Chloride 94 (*)    CO2 21 (*)    Calcium 8.7 (*)    AST 50 (*)    Alkaline Phosphatase 143 (*)    Anion gap 17 (*)    All other components within normal  limits  CBC - Abnormal; Notable for the following components:   WBC 12.0 (*)    All other components within normal limits  LIPASE, BLOOD  ETHANOL  URINALYSIS, ROUTINE W REFLEX MICROSCOPIC    EKG None  Radiology No results found.  Procedures Procedures    Medications Ordered in ED Medications - No data to display  ED Course/ Medical Decision Making/ A&P                           Medical Decision Making Amount and/or Complexity of Data Reviewed Labs: ordered.  Risk Prescription drug management.   Alert 56 year old female in no acute stress, nontoxic-appearing.  Presents emerged department complaint of nausea, vomiting, domino  pain, and bilateral foot pain.  Information was obtained from patient.  I reviewed patient's past medical records, including previous prior notes, labs, and imaging.  Patient has medical history as outlined in HPI which complicates her care.  Due to reports of nausea, vomiting, and abdominal cramping will obtain CMP, CBC, lipase.  Patient reports recent continuation of chronic alcohol use.  Patient is inconsistent with amount of time from last drink.  Will check ethanol.  Patient had documented CIWA of 9 we will give patient p.o. Ativan at this time.  I personally viewed and interpreted patient's lab results.  Pertinent findings include: -Leukocytosis 12.0 suspect this is reactive due to her reports of vomiting. -AST 58, alk phos 143, ALT within normal limits; suspect this is secondary to EtOH use. -Anion gap 17, bicarb 21; SPECT this may be due to alcoholic acidosis.  We will give future D 5% fluid bolus. -Ethanol level within normal limits  Patient able to tolerate p.o. intake without difficulty after receiving Zofran.  On serial reexamination abdomen soft, nondistended, nontender with no guarding or rebound tenderness.  Patient has no tachycardia or hypertension, low suspicion for alcohol withdrawal at this time.  Patient does express wanting to  stop drinking and therefore we will prescribe her with Librium taper.  Patient given resources to follow-up with counseling in the outpatient setting.  Patient care discussed with attending patient Dr. Ernesto Rutherford  Based on patient's chief complaint, I considered admission might be necessary, however after reassuring ED workup feel patient is reasonable for discharge.  Discussed results, findings, treatment and follow up. Patient advised of return precautions. Patient verbalized understanding and agreed with plan.  Portions of this note were generated with [employer]. Dictation errors may occur despite best attempts at proofreading.          Final Clinical Impression(s) / ED Diagnoses Final diagnoses:  ETOH abuse  Nausea and vomiting, unspecified vomiting type    Rx / DC Orders ED Discharge Orders          Ordered    chlordiazePOXIDE (LIBRIUM) 25 MG capsule        07/26/22 2038    ondansetron (ZOFRAN-ODT) 4 MG disintegrating tablet  Every 8 hours PRN        07/26/22 2038              Loni Beckwith, PA-C 07/27/22 0053    Ottie Glazier, DO 07/27/22 1501

## 2022-07-27 DIAGNOSIS — F332 Major depressive disorder, recurrent severe without psychotic features: Secondary | ICD-10-CM | POA: Diagnosis not present

## 2022-07-27 DIAGNOSIS — F1024 Alcohol dependence with alcohol-induced mood disorder: Secondary | ICD-10-CM | POA: Diagnosis present

## 2022-07-27 DIAGNOSIS — F1010 Alcohol abuse, uncomplicated: Secondary | ICD-10-CM

## 2022-07-27 LAB — COMPREHENSIVE METABOLIC PANEL
ALT: 25 U/L (ref 0–44)
AST: 54 U/L — ABNORMAL HIGH (ref 15–41)
Albumin: 4.2 g/dL (ref 3.5–5.0)
Alkaline Phosphatase: 135 U/L — ABNORMAL HIGH (ref 38–126)
Anion gap: 10 (ref 5–15)
BUN: 10 mg/dL (ref 6–20)
CO2: 27 mmol/L (ref 22–32)
Calcium: 9.4 mg/dL (ref 8.9–10.3)
Chloride: 97 mmol/L — ABNORMAL LOW (ref 98–111)
Creatinine, Ser: 0.83 mg/dL (ref 0.44–1.00)
GFR, Estimated: >60 mL/min (ref >60)
Glucose, Bld: 134 mg/dL — ABNORMAL HIGH (ref 70–99)
Potassium: 3.5 mmol/L (ref 3.5–5.1)
Sodium: 134 mmol/L — ABNORMAL LOW (ref 135–145)
Total Bilirubin: 1.9 mg/dL — ABNORMAL HIGH (ref 0.3–1.2)
Total Protein: 7.3 g/dL (ref 6.5–8.1)

## 2022-07-27 LAB — CBC WITH DIFFERENTIAL/PLATELET
Abs Immature Granulocytes: 0.04 10*3/uL (ref 0.00–0.07)
Basophils Absolute: 0 10*3/uL (ref 0.0–0.1)
Basophils Relative: 0 %
Eosinophils Absolute: 0 10*3/uL (ref 0.0–0.5)
Eosinophils Relative: 0 %
HCT: 41.4 % (ref 36.0–46.0)
Hemoglobin: 14.4 g/dL (ref 12.0–15.0)
Immature Granulocytes: 0 %
Lymphocytes Relative: 25 %
Lymphs Abs: 2.2 10*3/uL (ref 0.7–4.0)
MCH: 30.6 pg (ref 26.0–34.0)
MCHC: 34.8 g/dL (ref 30.0–36.0)
MCV: 87.9 fL (ref 80.0–100.0)
Monocytes Absolute: 0.4 10*3/uL (ref 0.1–1.0)
Monocytes Relative: 4 %
Neutro Abs: 6.4 10*3/uL (ref 1.7–7.7)
Neutrophils Relative %: 71 %
Platelets: 247 10*3/uL (ref 150–400)
RBC: 4.71 MIL/uL (ref 3.87–5.11)
RDW: 13.4 % (ref 11.5–15.5)
WBC: 9.1 10*3/uL (ref 4.0–10.5)
nRBC: 0 % (ref 0.0–0.2)

## 2022-07-27 LAB — LIPID PANEL
Cholesterol: 249 mg/dL — ABNORMAL HIGH (ref 0–200)
HDL: >135 mg/dL (ref >40)
Triglycerides: 79 mg/dL (ref <150)
VLDL: 16 mg/dL (ref 0–40)

## 2022-07-27 LAB — HEMOGLOBIN A1C
Hgb A1c MFr Bld: 5.6 % (ref 4.8–5.6)
Mean Plasma Glucose: 114.02 mg/dL

## 2022-07-27 LAB — POCT URINE DRUG SCREEN - MANUAL ENTRY (I-SCREEN)
POC Amphetamine UR: NOT DETECTED
POC Buprenorphine (BUP): NOT DETECTED
POC Cocaine UR: NOT DETECTED
POC Marijuana UR: NOT DETECTED
POC Methadone UR: NOT DETECTED
POC Methamphetamine UR: NOT DETECTED
POC Morphine: NOT DETECTED
POC Oxazepam (BZO): POSITIVE — AB
POC Oxycodone UR: NOT DETECTED
POC Secobarbital (BAR): NOT DETECTED

## 2022-07-27 LAB — RESP PANEL BY RT-PCR (FLU A&B, COVID) ARPGX2
Influenza A by PCR: NEGATIVE
Influenza B by PCR: NEGATIVE
SARS Coronavirus 2 by RT PCR: NEGATIVE

## 2022-07-27 LAB — POC SARS CORONAVIRUS 2 AG: SARSCOV2ONAVIRUS 2 AG: NEGATIVE

## 2022-07-27 LAB — ETHANOL: Alcohol, Ethyl (B): <10 mg/dL (ref <10)

## 2022-07-27 MED ORDER — TRAZODONE HCL 50 MG PO TABS
50.0000 mg | ORAL_TABLET | Freq: Every evening | ORAL | Status: DC | PRN
  Administered 2022-07-28 – 2022-08-03 (×7): 50 mg via ORAL
  Filled 2022-07-27 (×7): qty 1

## 2022-07-27 MED ORDER — THIAMINE HCL 100 MG PO TABS
100.0000 mg | ORAL_TABLET | Freq: Every day | ORAL | Status: DC
  Administered 2022-07-28 – 2022-07-29 (×2): 100 mg via ORAL
  Filled 2022-07-27 (×2): qty 1

## 2022-07-27 MED ORDER — ADULT MULTIVITAMIN W/MINERALS CH
1.0000 | ORAL_TABLET | Freq: Every day | ORAL | Status: DC
  Administered 2022-07-27 – 2022-08-04 (×9): 1 via ORAL
  Filled 2022-07-27 (×9): qty 1

## 2022-07-27 MED ORDER — ACETAMINOPHEN 325 MG PO TABS: 650.0000 mg | ORAL_TABLET | Freq: Four times a day (QID) | ORAL | Status: DC | PRN

## 2022-07-27 MED ORDER — LORAZEPAM 1 MG PO TABS: 1.0000 mg | ORAL_TABLET | Freq: Two times a day (BID) | ORAL | Status: DC

## 2022-07-27 MED ORDER — LOPERAMIDE HCL 2 MG PO CAPS
2.0000 mg | ORAL_CAPSULE | ORAL | Status: AC | PRN
  Administered 2022-07-27: 2 mg via ORAL
  Filled 2022-07-27: qty 1

## 2022-07-27 MED ORDER — LORAZEPAM 1 MG PO TABS
1.0000 mg | ORAL_TABLET | Freq: Three times a day (TID) | ORAL | Status: DC
  Administered 2022-07-27 – 2022-07-28 (×2): 1 mg via ORAL
  Filled 2022-07-27: qty 1

## 2022-07-27 MED ORDER — LORAZEPAM 1 MG PO TABS: 1.0000 mg | ORAL_TABLET | Freq: Every day | ORAL | Status: DC

## 2022-07-27 MED ORDER — LORAZEPAM 1 MG PO TABS
1.0000 mg | ORAL_TABLET | Freq: Four times a day (QID) | ORAL | Status: AC | PRN
  Filled 2022-07-27: qty 1

## 2022-07-27 MED ORDER — THIAMINE HCL 100 MG/ML IJ SOLN
100.0000 mg | Freq: Once | INTRAMUSCULAR | Status: AC
  Administered 2022-07-27: 100 mg via INTRAMUSCULAR
  Filled 2022-07-27: qty 2

## 2022-07-27 MED ORDER — HYDROXYZINE HCL 25 MG PO TABS: 25.0000 mg | ORAL_TABLET | Freq: Four times a day (QID) | ORAL | Status: AC | PRN

## 2022-07-27 MED ORDER — ALUM & MAG HYDROXIDE-SIMETH 200-200-20 MG/5ML PO SUSP
30.0000 mL | ORAL | Status: DC | PRN
  Filled 2022-07-27: qty 30

## 2022-07-27 MED ORDER — GABAPENTIN 100 MG PO CAPS
100.0000 mg | ORAL_CAPSULE | Freq: Three times a day (TID) | ORAL | Status: DC
  Administered 2022-07-27 (×3): 100 mg via ORAL
  Filled 2022-07-27 (×3): qty 1

## 2022-07-27 MED ORDER — LORAZEPAM 1 MG PO TABS
1.0000 mg | ORAL_TABLET | Freq: Four times a day (QID) | ORAL | Status: AC
  Administered 2022-07-27 (×4): 1 mg via ORAL
  Filled 2022-07-27 (×4): qty 1

## 2022-07-27 MED ORDER — ONDANSETRON 4 MG PO TBDP
4.0000 mg | ORAL_TABLET | Freq: Four times a day (QID) | ORAL | Status: AC | PRN
  Administered 2022-07-27 – 2022-07-28 (×2): 4 mg via ORAL
  Filled 2022-07-27: qty 1

## 2022-07-27 MED ORDER — MAGNESIUM HYDROXIDE 400 MG/5ML PO SUSP: 30.0000 mL | Freq: Every day | ORAL | Status: DC | PRN

## 2022-07-27 NOTE — ED Notes (Signed)
Pt reports Zofran and Immodium effective in managing nausea and diarrhea. No further complaints noted. Will continue to monitor for safety.

## 2022-07-27 NOTE — ED Notes (Addendum)
Patient to [facility] - no distress noted - denies SI, HI, AVH at this time - food given. Explained to patient that she will go to [facility] once COVID is negative - will continue to monitor for safety  Patient asking about her gabapentin - message sent to provider

## 2022-07-27 NOTE — ED Notes (Signed)
Pt c/o nausea and mild diarrhea. Pt received Zofran and Immodium. Laying in room. Denies needs. Informed pt to notify staff with any needs or concerns. Will monitor for safety.

## 2022-07-27 NOTE — ED Notes (Signed)
Patient denied SI/HI and AVH. Patient ate breakfast and interacted with the patients on the unit. Patient allowed staff to draw her blood. Patient has been calm and cooperative this morning.
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
## 2022-07-27 NOTE — ED Notes (Signed)
Patient too sleepy to walk to [facility] - will walk patient down when she is more awake - will continue to monitor for safety

## 2022-07-27 NOTE — ED Notes (Signed)
Pt resting in bed. A&O x4, calm and cooperative. Denies current SI/HI/AVH. No signs of distress noted. Monitoring for safety. 

## 2022-07-28 ENCOUNTER — Encounter (HOSPITAL_COMMUNITY): Payer: Self-pay

## 2022-07-28 DIAGNOSIS — F1010 Alcohol abuse, uncomplicated: Secondary | ICD-10-CM | POA: Diagnosis not present

## 2022-07-28 DIAGNOSIS — F332 Major depressive disorder, recurrent severe without psychotic features: Secondary | ICD-10-CM | POA: Diagnosis not present

## 2022-07-28 LAB — HEPATIC FUNCTION PANEL
ALT: 26 U/L (ref 0–44)
AST: 44 U/L — ABNORMAL HIGH (ref 15–41)
Albumin: 4.2 g/dL (ref 3.5–5.0)
Alkaline Phosphatase: 126 U/L (ref 38–126)
Bilirubin, Direct: 0.2 mg/dL (ref 0.0–0.2)
Indirect Bilirubin: 0.4 mg/dL (ref 0.3–0.9)
Total Bilirubin: 0.6 mg/dL (ref 0.3–1.2)
Total Protein: 7.5 g/dL (ref 6.5–8.1)

## 2022-07-28 LAB — VITAMIN B12: Vitamin B-12: 690 pg/mL (ref 180–914)

## 2022-07-28 MED ORDER — GABAPENTIN 100 MG PO CAPS
200.0000 mg | ORAL_CAPSULE | Freq: Three times a day (TID) | ORAL | Status: DC
  Administered 2022-07-28 – 2022-07-30 (×6): 200 mg via ORAL
  Filled 2022-07-28 (×6): qty 2

## 2022-07-28 MED ORDER — DICLOFENAC SODIUM 1 % EX GEL: 2.0000 g | Freq: Four times a day (QID) | CUTANEOUS | Status: DC | PRN

## 2022-07-28 NOTE — ED Notes (Addendum)
Pt had incontinent episode. New scrubs, underwear, and shower supplies provided. Pt currently in shower. Monitoring for safety.
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
## 2022-07-28 NOTE — BH IP Treatment Plan (Addendum)
Interdisciplinary Treatment and Diagnostic Plan Update  07/28/2022 Time of Session: 10:15AM  Natalie Mcdaniel MRN: [id number]  Diagnosis:  Final diagnoses:  Alcohol abuse  Severe episode of recurrent major depressive disorder, without psychotic features ([place])     Current Medications:  Current Facility-Administered Medications  Medication Dose Route Frequency Provider Last Rate Last Admin   acetaminophen (TYLENOL) tablet 650 mg  650 mg Oral Q6H PRN Bobbitt, Shalon E, NP       alum & mag hydroxide-simeth (MAALOX/MYLANTA) 200-200-20 MG/5ML suspension 30 mL  30 mL Oral Q4H PRN Bobbitt, Shalon E, NP       gabapentin (NEURONTIN) capsule 100 mg  100 mg Oral TID Bobbitt, Shalon E, NP   100 mg at 07/27/22 2128   hydrOXYzine (ATARAX) tablet 25 mg  25 mg Oral Q6H PRN Bobbitt, Shalon E, NP       loperamide (IMODIUM) capsule 2-4 mg  2-4 mg Oral PRN Bobbitt, Shalon E, NP   2 mg at 07/27/22 1359   LORazepam (ATIVAN) tablet 1 mg  1 mg Oral Q6H PRN Bobbitt, Shalon E, NP       LORazepam (ATIVAN) tablet 1 mg  1 mg Oral TID Bobbitt, Shalon E, NP   1 mg at 07/27/22 2128   Followed by   LORazepam (ATIVAN) tablet 1 mg  1 mg Oral BID Bobbitt, Shalon E, NP       Followed by   Derrill Memo ON 07/30/2022] LORazepam (ATIVAN) tablet 1 mg  1 mg Oral Daily Bobbitt, Shalon E, NP       magnesium hydroxide (MILK OF MAGNESIA) suspension 30 mL  30 mL Oral Daily PRN Bobbitt, Shalon E, NP       multivitamin with minerals tablet 1 tablet  1 tablet Oral Daily Bobbitt, Shalon E, NP   1 tablet at 07/27/22 0938   ondansetron (ZOFRAN-ODT) disintegrating tablet 4 mg  4 mg Oral Q6H PRN Bobbitt, Shalon E, NP   4 mg at 07/27/22 1359   thiamine (VITAMIN B1) tablet 100 mg  100 mg Oral Daily Bobbitt, Shalon E, NP       traZODone (DESYREL) tablet 50 mg  50 mg Oral QHS PRN Bobbitt, Shalon E, NP       Current Outpatient Medications  Medication Sig Dispense Refill   gabapentin (NEURONTIN) 300 MG capsule Take 300 mg by mouth 3 (three) times  daily.     PTA Medications: Prior to Admission medications   Medication Sig Start Date End Date Taking? Authorizing Provider  gabapentin (NEURONTIN) 300 MG capsule Take 300 mg by mouth 3 (three) times daily. 06/22/22   [provider]    Patient Stressors: Financial difficulties   Health problems   Loss of housing; Homeless; Husband was deported; Youngest daughter incarcerated   Substance abuse    Patient Strengths: Motivation for treatment/growth   Treatment Modalities: Medication Management, Group therapy, Case management,  1 to 1 session with clinician, Psychoeducation, Recreational therapy.   Physician Treatment Plan for Primary and Secondary Diagnosis:  Final diagnoses:  Alcohol abuse  Severe episode of recurrent major depressive disorder, without psychotic features ([place])   Long Term Goal(s): Improvement in symptoms so as ready for discharge  Short Term Goals: Patient will verbalize feelings in meetings with treatment team members. Patient will attend at least of 50% of the groups daily. Patient will take medications as prescribed daily.  Medication Management: Evaluate patient's response, side effects, and tolerance of medication regimen.  Therapeutic Interventions: 1 to 1 sessions, Unit  Group sessions and Medication administration.  Evaluation of Outcomes: Adequate for Discharge  LCSW Treatment Plan for Primary Diagnosis:  Final diagnoses:  Alcohol abuse  Severe episode of recurrent major depressive disorder, without psychotic features ([place])    Long Term Goal(s): Safe transition to appropriate next level of care at discharge.  Short Term Goals: Facilitate acceptance of mental health diagnosis and concerns through verbal commitment to aftercare plan and appointments at discharge. and Identify minimum of 2 triggers associated with mental health/substance abuse issues with treatment team members.  Therapeutic Interventions: Assess for all discharge needs, 1 to  1 time with Education officer, museum, Explore available resources and support systems, Assess for adequacy in community support network, Educate family and significant other(s) on suicide prevention, Complete Psychosocial Assessment, Interpersonal group therapy.  Evaluation of Outcomes: Adequate for Discharge   Progress in Treatment: Attending groups: Yes. Participating in groups: Yes. Taking medication as prescribed: Yes. Toleration medication: Yes. Family/Significant other contact made: No, will contact:  no one at this time Patient understands diagnosis: Yes. Discussing patient identified problems/goals with staff: Yes. Medical problems stabilized or resolved: Yes. Denies suicidal/homicidal ideation: Yes. Issues/concerns per patient self-inventory: No. Other: None   New problem(s) identified: None   New Short Term/Long Term Goal(s): Natalie Mcdaniel reports her long term goals is to obtain employment, so that she can regain stable housing. Natalie Mcdaniel also reports a long term goal is to stop drinking alcohol.  Patient Goals: "I don't know right now"    Discharge Plan or Barriers: Natalie Mcdaniel has improved during her stay in the [facility] and is now recommended for discharge. Natalie Mcdaniel plans to discharge to an [facility] appointment at 5:00PM today to determine if she can participate in their sober-living programming. Natalie Mcdaniel shared that she will drive to her appointment, as her car is in the [facility] parking lot.   Reason for Continuation of Hospitalization: None  Estimated Length of Stay: Discharge, today 08/04/22  Last 3 [place] Suicide Severity Risk Score: [facility] ED from 07/26/2022 in [facility] Most recent reading at 07/27/2022  1:00 PM ED from 07/26/2022 in [place] Most recent reading at 07/26/2022  2:10 PM ED from 06/02/2022 in [facility] DEPT Most recent reading at 06/02/2022  2:41 PM  C-SSRS RISK CATEGORY No Risk No  Risk No Risk       Last PHQ 2/9 Scores:    07/27/2022   12:24 AM  Depression screen PHQ 2/9  Decreased Interest 2  Down, Depressed, Hopeless 2  PHQ - 2 Score 4  Altered sleeping 1  Tired, decreased energy 1  Change in appetite 1  Trouble concentrating 1  Moving slowly or fidgety/restless 1  Suicidal thoughts 1  PHQ-9 Score 10  Difficult doing work/chores Extremely dIfficult    Scribe for Treatment Team: Marylee Floras, LCSW 07/28/2022 9:24 AM

## 2022-07-28 NOTE — ED Notes (Signed)
Pt asleep in bed. Respirations even and unlabored. Monitoring for safety. 

## 2022-07-28 NOTE — ED Provider Notes (Signed)
FBC Progress Note  Date and Time: 07/28/2022 4:19 PM Name: Natalie Mcdaniel MRN:  [id number]  Reason For Admission: Patient is a 56 year old female with alcohol use disorder complicated by Wernicke's encephalopathy, MDD, hepatic steatosis, vitamin B1 deficiency neuropathy, and Roux-en-Y presenting to [facility] due to worsening depression, worsening anxiety, alcohol use disorder, and passive SI.  Subjective: Patient presented in dayroom.  Patient endorses passive SI but denies HI/AVH.  Patient reports still having depressed mood and anxiety.  Patient reports eating appropriately and sleeping well.  Patient reports having extensive history of memory problems since several years ago when she had a fall that led to multiple broken teeth.  Patient endorses history of alcohol use disorder that has been complicated by alcohol induced neuropathy.  Patient reports that this week has recently worsened as she has experienced some burning pain as well as paresthesia bilaterally that initially started in her feet and have been ascending to her knees for past several years.  Patient reports needing to take off her shoes while she drives as she cannot feel her feet when she presses on the pedals.  Patient requires holding onto the walls to move while on the unit as she feels very unsteady otherwise  Diagnosis:  Final diagnoses:  Alcohol abuse  Severe episode of recurrent major depressive disorder, without psychotic features ([place])    Total Time spent with patient: 45 minutes   Labs  Lab Results:     Latest Ref Rng & Units 07/27/2022    9:09 AM 07/26/2022    2:19 PM 06/02/2022    3:28 PM  CBC  WBC 4.0 - 10.5 K/uL 9.1  12.0  12.7   Hemoglobin 12.0 - 15.0 g/dL 14.4  13.7  15.2   Hematocrit 36.0 - 46.0 % 41.4  40.5  46.8   Platelets 150 - 400 K/uL 247  305  338       Latest Ref Rng & Units 07/27/2022    9:09 AM 07/26/2022    2:19 PM 06/02/2022    3:28 PM  CMP  Glucose 70 - 99 mg/dL 134  99  115   BUN 6 - 20 mg/dL  '10  17  19   '$ Creatinine 0.44 - 1.00 mg/dL 0.83  0.87  1.08   Sodium 135 - 145 mmol/L 134  132  138   Potassium 3.5 - 5.1 mmol/L 3.5  3.8  4.5   Chloride 98 - 111 mmol/L 97  94  104   CO2 22 - 32 mmol/L '27  21  23   '$ Calcium 8.9 - 10.3 mg/dL 9.4  8.7  9.1   Total Protein 6.5 - 8.1 g/dL 7.3  7.4  8.0   Total Bilirubin 0.3 - 1.2 mg/dL 1.9  1.1  0.5   Alkaline Phos 38 - 126 U/L 135  143  147   AST 15 - 41 U/L 54  50  26   ALT 0 - 44 U/L '25  27  21     '$ Physical Findings   AUDIT    Flowsheet Row Admission (Discharged) from 12/19/2014 in [place] [facility]  Alcohol Use Disorder Identification Test Final Score (AUDIT) 36      PHQ2-9    Flowsheet Row ED from 07/26/2022 in [facility]  PHQ-2 Total Score 4  PHQ-9 Total Score [place] ED from 07/26/2022 in [facility] Most recent reading at 07/27/2022  1:00  PM ED from 07/26/2022 in [place] Most recent reading at 07/26/2022  2:10 PM ED from 06/02/2022 in [place] DEPT Most recent reading at 06/02/2022  2:41 PM  C-SSRS RISK CATEGORY No Risk No Risk No Risk        Musculoskeletal  Strength & Muscle Tone: decreased Gait & Station: unsteady Patient leans: N/A  Psychiatric Specialty Exam  Presentation  General Appearance: Casual   Eye Contact:Good   Speech:Clear and Coherent   Speech Volume:Normal   Handedness:No data recorded   Mood and Affect  Mood:Depressed   Affect:Congruent    Thought Process  Thought Processes:Coherent   Descriptions of Associations:Intact   Orientation:Full (Time, Place and Person)   Thought Content:Logical   Diagnosis of Schizophrenia or Schizoaffective disorder in past: No     Hallucinations:Hallucinations: None   Ideas of Reference:None   Suicidal Thoughts:Suicidal Thoughts: Yes, Passive SI Passive Intent  and/or Plan: Without Intent; Without Plan   Homicidal Thoughts:Homicidal Thoughts: No    Sensorium  Memory:Immediate Good; Recent Good; Remote Good   Judgment:Fair   Insight:Fair    Executive Functions  Concentration:No data recorded  Attention Span:Fair   [place] of Knowledge:Good   Language:Good    Psychomotor Activity  Psychomotor Activity:Psychomotor Activity: Other (comment) (gait is slow due to neuropathy)    Assets  Assets:Communication Skills; Physical Health; Resilience; Desire for Improvement    Sleep  Sleep:Sleep: Fair Number of Hours of Sleep: -1    Physical Exam  Physical Exam Vitals and nursing note reviewed.  Constitutional:      General: She is not in acute distress.    Appearance: She is well-developed.  HENT:     Head: Normocephalic and atraumatic.  Eyes:     General: No visual field deficit.    Conjunctiva/sclera: Conjunctivae normal.  Cardiovascular:     Rate and Rhythm: Normal rate and regular rhythm.     Heart sounds: No murmur heard. Pulmonary:     Effort: Pulmonary effort is normal. No respiratory distress.     Breath sounds: Normal breath sounds.  Abdominal:     Palpations: Abdomen is soft.     Tenderness: There is no abdominal tenderness.  Musculoskeletal:        General: No swelling.     Cervical back: Neck supple.  Skin:    General: Skin is warm and dry.     Capillary Refill: Capillary refill takes less than 2 seconds.  Neurological:     Mental Status: She is alert. Mental status is at baseline. She is disoriented.     Cranial Nerves: Cranial nerves 2-12 are intact. No cranial nerve deficit or dysarthria.     Motor: No pronator drift.     Coordination: Coordination is intact. Finger-Nose-Finger Test and Heel to [facility] Test normal.     Gait: Gait abnormal.     Deep Tendon Reflexes: Babinski sign present on the right side. Babinski sign present on the left side.     Reflex Scores:      Bicep  reflexes are 2+ on the right side and 2+ on the left side.      Patellar reflexes are 2+ on the right side and 3+ on the left side.    Comments: AxOx3. Does not know month or year Sensory: severe paresthesia from distal thigh extending to feet bilaterally. Numbness in tips of fingers. Appropriate sensation to temperature Motor: 5/5 BUE. 3/5 in proximal BLE secondary to pain  and weakness Unsteady gait on ambulation secondary to neuropathy  Psychiatric:        Mood and Affect: Mood normal.    Review of Systems  Respiratory:  Negative for shortness of breath.   Cardiovascular:  Negative for chest pain.  Gastrointestinal:  Negative for abdominal pain, constipation, diarrhea, heartburn, nausea and vomiting.  Neurological:  Positive for tingling and weakness. Negative for seizures and headaches.  Psychiatric/Behavioral:  Positive for depression, substance abuse and suicidal ideas. Negative for hallucinations. The patient is nervous/anxious. The patient does not have insomnia.    Blood pressure (!) 118/95, pulse 90, temperature 97.7 F (36.5 C), temperature source Oral, resp. rate 19, last menstrual period 08/31/2013, SpO2 100 %. There is no height or weight on file to calculate BMI.  ASSESSMENT Patient is a 56 year old female with alcohol use disorder complicated by Wernicke's encephalopathy, MDD, hepatic steatosis, vitamin B1 deficiency neuropathy, and Roux-en-Y presenting to [facility] due to worsening depression, worsening anxiety, alcohol use disorder, and passive SI.  PLAN Alcohol use disorder Wernicke's encephalopathy Alcohol induced neuropathy Concern for Korsakoff   -MVI/Thiamine PO (may require IM thiamine due to roux-en-y) -CIWA protocol -Increase Gabapentin to 200 mg tid for alcohol induced neuropathy  MDD-severe, recurrent -Consider starting SSRI tomorrow  Dispo: Patient requires higher level of care (med psych or geropsych).   France Ravens, MD 07/28/2022 4:19 PM

## 2022-07-28 NOTE — ED Notes (Signed)
Pt sleeping@this time. Breathing even and unlabored. Will continue to monitor for safety 

## 2022-07-28 NOTE — ED Notes (Signed)
Pt in her room calm and cooperative. No c/o pain or distress. Will continue to monitor for safety

## 2022-07-28 NOTE — ED Notes (Signed)
Patient has some problems with incontinence. She needs to use pads. Patient is very pleasant. Is watching TV is day room

## 2022-07-28 NOTE — ED Notes (Signed)
Patient is resting quietly in bed, she spoke with her daughter on the phone, she is very teary. She said her daughter will help her. She is living in her car. She states that she is dizzy when she gets our of bed. She needs assistance when walking as she is not steady on her feet. Patient stated that she is nauseated after eating lunch. Will mention in the chat. Will continue to monitor for safety.

## 2022-07-29 DIAGNOSIS — F1010 Alcohol abuse, uncomplicated: Secondary | ICD-10-CM | POA: Diagnosis not present

## 2022-07-29 DIAGNOSIS — F332 Major depressive disorder, recurrent severe without psychotic features: Secondary | ICD-10-CM | POA: Diagnosis not present

## 2022-07-29 LAB — URINALYSIS, ROUTINE W REFLEX MICROSCOPIC
Bilirubin Urine: NEGATIVE
Glucose, UA: NEGATIVE mg/dL
Hgb urine dipstick: NEGATIVE
Ketones, ur: NEGATIVE mg/dL
Leukocytes,Ua: NEGATIVE
Nitrite: NEGATIVE
Protein, ur: NEGATIVE mg/dL
Specific Gravity, Urine: 1.002 — ABNORMAL LOW (ref 1.005–1.030)
pH: 6 (ref 5.0–8.0)

## 2022-07-29 LAB — LIPID PANEL
Cholesterol: 210 mg/dL — ABNORMAL HIGH (ref 0–200)
HDL: 125 mg/dL (ref >40)
LDL Cholesterol: 74 mg/dL (ref 0–99)
Total CHOL/HDL Ratio: 1.7 RATIO
Triglycerides: 54 mg/dL (ref <150)
VLDL: 11 mg/dL (ref 0–40)

## 2022-07-29 MED ORDER — THIAMINE HCL 100 MG/ML IJ SOLN
500.0000 mg | Freq: Once | INTRAMUSCULAR | Status: AC
  Administered 2022-07-29: 500 mg via INTRAMUSCULAR

## 2022-07-29 MED ORDER — THIAMINE HCL 100 MG PO TABS
500.0000 mg | ORAL_TABLET | Freq: Every day | ORAL | Status: DC
  Administered 2022-07-30: 500 mg via ORAL
  Filled 2022-07-29: qty 5

## 2022-07-29 MED ORDER — THIAMINE HCL 100 MG/ML IJ SOLN
500.0000 mg | INTRAMUSCULAR | Status: DC
  Filled 2022-07-29: qty 6

## 2022-07-29 MED ORDER — SERTRALINE HCL 50 MG PO TABS
50.0000 mg | ORAL_TABLET | Freq: Every day | ORAL | Status: DC
  Administered 2022-07-29 – 2022-08-04 (×7): 50 mg via ORAL
  Filled 2022-07-29 (×7): qty 1

## 2022-07-29 NOTE — ED Notes (Signed)
Pt is in the bed sleeping. Respirations are even and unlabored. No acute distress noted. Will continue to monitor for safety. 

## 2022-07-29 NOTE — Clinical Social Work Psych Note (Signed)
LCSW Update Note  Natalie Mcdaniel shared that she felt "better" this morning. Natalie Mcdaniel denied having any SI, HI or AVH at this time.   Natalie Mcdaniel reports she continues to worry about her well-being once she is released from the facility. LCSW informed Natalie Mcdaniel that providers were seeking a higher level of care due to concerns regarding her mental health issues, as well as her physical/medical concerns.   LCSW continues to seek possible inpatient gero-psych beds for placement. LCSW re-sent the patient's information to the following facilities:  [facility]   [facility]   [facility]     LCSW will continue to follow for possible placement.    Radonna Ricker, MSW, LCSW Clinical Education officer, museum ([place]) [facility]

## 2022-07-29 NOTE — ED Provider Notes (Signed)
FBC Progress Note  Date and Time: 07/29/2022 10:52 AM Name: CHEYANNA STRICK MRN:  [id number]  Reason For Admission: Patient is a 56 year old female with alcohol use disorder complicated by Wernicke's encephalopathy, MDD, hepatic steatosis, vitamin B1 deficiency neuropathy, and Roux-en-Y presenting to [facility] due to worsening depression, worsening anxiety, alcohol use disorder, and passive SI.  Subjective: Patient presented in dayroom.  Patient denies SI/HI/AVH.  Patient reports still having depressed mood and anxiety.  Patient reports eating appropriately and sleeping well.  Reports feeling more optimistic.  Continues to feel depressed and anxious.  Appears resigned and hopeless. Reports had some urinary incontinence last night due to her neuropathy.  Patient amenable to starting Zoloft.  R/B/SE discussed with patient and patient amenable to medication trial.  Diagnosis:  Final diagnoses:  Alcohol abuse  Severe episode of recurrent major depressive disorder, without psychotic features ([place])    Total Time spent with patient: 45 minutes   Labs  Lab Results:     Latest Ref Rng & Units 07/27/2022    9:09 AM 07/26/2022    2:19 PM 06/02/2022    3:28 PM  CBC  WBC 4.0 - 10.5 K/uL 9.1  12.0  12.7   Hemoglobin 12.0 - 15.0 g/dL 14.4  13.7  15.2   Hematocrit 36.0 - 46.0 % 41.4  40.5  46.8   Platelets 150 - 400 K/uL 247  305  338       Latest Ref Rng & Units 07/28/2022    5:58 PM 07/27/2022    9:09 AM 07/26/2022    2:19 PM  CMP  Glucose 70 - 99 mg/dL  134  99   BUN 6 - 20 mg/dL  10  17   Creatinine 0.44 - 1.00 mg/dL  0.83  0.87   Sodium 135 - 145 mmol/L  134  132   Potassium 3.5 - 5.1 mmol/L  3.5  3.8   Chloride 98 - 111 mmol/L  97  94   CO2 22 - 32 mmol/L  27  21   Calcium 8.9 - 10.3 mg/dL  9.4  8.7   Total Protein 6.5 - 8.1 g/dL 7.5  7.3  7.4   Total Bilirubin 0.3 - 1.2 mg/dL 0.6  1.9  1.1   Alkaline Phos 38 - 126 U/L 126  135  143   AST 15 - 41 U/L 44  54  50   ALT 0 - 44 U/L '26  25  27      '$ Physical Findings   AUDIT    Flowsheet Row Admission (Discharged) from 12/19/2014 in [place] [facility]  Alcohol Use Disorder Identification Test Final Score (AUDIT) 36      PHQ2-9    Flowsheet Row ED from 07/26/2022 in [facility]  PHQ-2 Total Score 4  PHQ-9 Total Score 10      Flowsheet Row ED from 07/26/2022 in [facility] Most recent reading at 07/27/2022  1:00 PM ED from 07/26/2022 in [place] Most recent reading at 07/26/2022  2:10 PM ED from 06/02/2022 in [place] DEPT Most recent reading at 06/02/2022  2:41 PM  C-SSRS RISK CATEGORY No Risk No Risk No Risk        Musculoskeletal  Strength & Muscle Tone: decreased Gait & Station: unsteady Patient leans: N/A  Psychiatric Specialty Exam  Presentation  General Appearance: Appropriate for Environment; Casual   Eye Contact:Fair   Speech:Clear and  Coherent; Normal Rate   Speech Volume:Normal   Handedness:No data recorded   Mood and Affect  Mood:Depressed; Hopeless   Affect:Congruent    Thought Process  Thought Processes:Coherent   Descriptions of Associations:Intact   Orientation:Partial   Thought Content:Logical   Diagnosis of Schizophrenia or Schizoaffective disorder in past: No     Hallucinations:Hallucinations: None    Ideas of Reference:None   Suicidal Thoughts:Suicidal Thoughts: No    Homicidal Thoughts:Homicidal Thoughts: No     Sensorium  Memory:Immediate Fair; Recent Poor; Remote Poor   Judgment:Fair   Insight:Fair    Executive Functions  Concentration:Fair   Attention Span:Fair   [place] of Knowledge:Good   Language:Good    Psychomotor Activity  Psychomotor Activity:Psychomotor Activity: Decreased; Wide Based Gait     Assets  Assets:Communication Skills; Social  Support    Sleep  Sleep:Sleep: Fair     Physical Exam  Physical Exam Vitals and nursing note reviewed.  Constitutional:      General: She is not in acute distress.    Appearance: She is well-developed.  HENT:     Head: Normocephalic and atraumatic.  Eyes:     General: No visual field deficit.    Conjunctiva/sclera: Conjunctivae normal.  Cardiovascular:     Rate and Rhythm: Normal rate and regular rhythm.     Heart sounds: No murmur heard. Pulmonary:     Effort: Pulmonary effort is normal. No respiratory distress.     Breath sounds: Normal breath sounds.  Abdominal:     Palpations: Abdomen is soft.     Tenderness: There is no abdominal tenderness.  Musculoskeletal:        General: No swelling.     Cervical back: Neck supple.  Skin:    General: Skin is warm and dry.     Capillary Refill: Capillary refill takes less than 2 seconds.  Neurological:     Mental Status: She is alert. Mental status is at baseline. She is disoriented.     Cranial Nerves: Cranial nerves 2-12 are intact. No cranial nerve deficit or dysarthria.     Motor: No pronator drift.     Coordination: Coordination is intact. Finger-Nose-Finger Test and Heel to [facility] Test normal.     Gait: Gait abnormal.     Deep Tendon Reflexes: Babinski sign present on the right side. Babinski sign present on the left side.     Reflex Scores:      Bicep reflexes are 2+ on the right side and 2+ on the left side.      Patellar reflexes are 2+ on the right side and 3+ on the left side.    Comments: AxOx3. Does not know month or year Sensory: severe paresthesia from distal thigh extending to feet bilaterally. Numbness in tips of fingers. Appropriate sensation to temperature Motor: 5/5 BUE. 3/5 in proximal BLE secondary to pain and weakness Unsteady gait on ambulation secondary to neuropathy  Psychiatric:        Mood and Affect: Mood normal.    Review of Systems  Respiratory:  Negative for shortness of breath.    Cardiovascular:  Negative for chest pain.  Gastrointestinal:  Negative for abdominal pain, constipation, diarrhea, heartburn, nausea and vomiting.  Neurological:  Positive for tingling and weakness. Negative for seizures and headaches.  Psychiatric/Behavioral:  Positive for depression, substance abuse and suicidal ideas. Negative for hallucinations. The patient is nervous/anxious. The patient does not have insomnia.    Blood pressure (!) 130/90, pulse 90, temperature 98.3  F (36.8 C), temperature source Oral, resp. rate 20, last menstrual period 08/31/2013, SpO2 96 %. There is no height or weight on file to calculate BMI.  ASSESSMENT Patient is a 56 year old female with alcohol use disorder complicated by Wernicke's encephalopathy, MDD, hepatic steatosis, vitamin B1 deficiency neuropathy, and Roux-en-Y presenting to [facility] due to worsening depression, worsening anxiety, alcohol use disorder, and passive SI.  PLAN Alcohol use disorder Wernicke's encephalopathy Alcohol induced neuropathy Concern for [place]ors[place]off   -MVI/Thiamine PO (may require IM thiamine due to roux-en-y) -IM Thiamine 500 mg once -CIWA protocol -Continue Gabapentin to 200 mg tid for alcohol induced neuropathy  MDD-severe, recurrent -Start Zoloft 50 mg daily for depressive symptoms  Urinary Incontinence Possibly secondary to neuropathy but appears to only be due to difficulty ambulating to bathroom at night -UA -Continue to monitor  Dispo: Patient continues to require higher level of care (med psych or geropsych).   France Ravens, MD 07/29/2022 10:52 AM

## 2022-07-29 NOTE — ED Notes (Signed)
Pt is in the bed  reading a book. Respirations are even and unlabored. No acute distress noted. Will continue to monitor for safety.

## 2022-07-29 NOTE — ED Notes (Signed)
Pt is sitting up in day room watching TV.  She reports " my thinking feels less foggy and I feel better after that medicine ( thiamine).  Pt's speech is clear and concise she appears more alert  and astute.

## 2022-07-29 NOTE — ED Notes (Signed)
Pt asleep in bed. Respirations even and unlabored. Monitoring for safety. 

## 2022-07-30 DIAGNOSIS — F332 Major depressive disorder, recurrent severe without psychotic features: Secondary | ICD-10-CM | POA: Diagnosis not present

## 2022-07-30 DIAGNOSIS — F1010 Alcohol abuse, uncomplicated: Secondary | ICD-10-CM | POA: Diagnosis not present

## 2022-07-30 MED ORDER — GABAPENTIN 300 MG PO CAPS
300.0000 mg | ORAL_CAPSULE | Freq: Three times a day (TID) | ORAL | Status: DC
  Administered 2022-07-30 – 2022-08-04 (×16): 300 mg via ORAL
  Filled 2022-07-30 (×16): qty 1

## 2022-07-30 NOTE — ED Notes (Signed)
Pt is in the bed sleeping. Respirations are even and unlabored. No acute distress noted. Will continue to monitor for safety. 

## 2022-07-30 NOTE — ED Notes (Signed)
Patient sleeping with no sxs of distress noted - will continue to monitor for safety 

## 2022-07-30 NOTE — ED Notes (Signed)
Patient in [place] meeting at this time. Denies SI, HI, AVH - will continue to monitor for safety

## 2022-07-30 NOTE — Progress Notes (Signed)
Pt is awake, alert and oriented. Pt did not voice any complaints of pain or discomfort. No distress noted. Administered scheduled meds with no issue. Pt denies current SI/HI/AVH. Staff will monitor for pt's safety.

## 2022-07-30 NOTE — Progress Notes (Signed)
Pt is in the dining room watching TV. No distress noted or concerns voiced. Staff will monitor for pt's safety.

## 2022-07-30 NOTE — ED Provider Notes (Addendum)
FBC Progress Note  Date and Time: 07/30/2022 1:21 PM Name: Natalie Mcdaniel MRN:  [id number]  Reason For Admission: Patient is a 56 year old female with alcohol use disorder complicated by Wernicke's encephalopathy, MDD, hepatic steatosis, vitamin B1 deficiency neuropathy, and Roux-en-Y presenting to [facility] due to worsening depression, worsening anxiety, alcohol use disorder, and passive SI.  Subjective: Patient presented in dayroom.  Patient denies SI/HI/AVH.  Patient reports still having depressed mood and anxiety.  Patient reports eating appropriately and sleeping well.  Doing better but continues to be anxious and depressed especially due to her memory difficulties, neuropathy and ambulation.  Does not affect her sleep as they are waking her up to take gabapentin at this time.  Patient has no further concerns at this time.   Diagnosis:  Final diagnoses:  Alcohol abuse  Severe episode of recurrent major depressive disorder, without psychotic features (Home)    Total Time spent with patient: 45 minutes   Labs  Lab Results:     Latest Ref Rng & Units 07/27/2022    9:09 AM 07/26/2022    2:19 PM 06/02/2022    3:28 PM  CBC  WBC 4.0 - 10.5 K/uL 9.1  12.0  12.7   Hemoglobin 12.0 - 15.0 g/dL 14.4  13.7  15.2   Hematocrit 36.0 - 46.0 % 41.4  40.5  46.8   Platelets 150 - 400 K/uL 247  305  338       Latest Ref Rng & Units 07/28/2022    5:58 PM 07/27/2022    9:09 AM 07/26/2022    2:19 PM  CMP  Glucose 70 - 99 mg/dL  134  99   BUN 6 - 20 mg/dL  10  17   Creatinine 0.44 - 1.00 mg/dL  0.83  0.87   Sodium 135 - 145 mmol/L  134  132   Potassium 3.5 - 5.1 mmol/L  3.5  3.8   Chloride 98 - 111 mmol/L  97  94   CO2 22 - 32 mmol/L  27  21   Calcium 8.9 - 10.3 mg/dL  9.4  8.7   Total Protein 6.5 - 8.1 g/dL 7.5  7.3  7.4   Total Bilirubin 0.3 - 1.2 mg/dL 0.6  1.9  1.1   Alkaline Phos 38 - 126 U/L 126  135  143   AST 15 - 41 U/L 44  54  50   ALT 0 - 44 U/L '26  25  27     '$ Physical Findings   AUDIT     Flowsheet Row Admission (Discharged) from 12/19/2014 in [place] [facility]  Alcohol Use Disorder Identification Test Final Score (AUDIT) 36      PHQ2-9    Flowsheet Row ED from 07/26/2022 in [facility]  PHQ-2 Total Score 4  PHQ-9 Total Score 10      Flowsheet Row ED from 07/26/2022 in [facility] Most recent reading at 07/27/2022  1:00 PM ED from 07/26/2022 in [place] Most recent reading at 07/26/2022  2:10 PM ED from 06/02/2022 in [place] DEPT Most recent reading at 06/02/2022  2:41 PM  C-SSRS RISK CATEGORY No Risk No Risk No Risk        Musculoskeletal  Strength & Muscle Tone: decreased Gait & Station: unsteady Patient leans: N/A  Psychiatric Specialty Exam  Presentation  General Appearance: Appropriate for Environment; Casual   Eye Contact:Fair  Speech:Clear and Coherent; Normal Rate   Speech Volume:Normal   Handedness:No data recorded   Mood and Affect  Mood:Depressed; Hopeless   Affect:Congruent    Thought Process  Thought Processes:Coherent   Descriptions of Associations:Intact   Orientation:Partial   Thought Content:Logical   Diagnosis of Schizophrenia or Schizoaffective disorder in past: No     Hallucinations:Hallucinations: None    Ideas of Reference:None   Suicidal Thoughts:Suicidal Thoughts: No    Homicidal Thoughts:Homicidal Thoughts: No     Sensorium  Memory:Immediate Fair; Recent Poor; Remote Poor   Judgment:Fair   Insight:Fair    Executive Functions  Concentration:Fair   Attention Span:Fair   [place] of Knowledge:Good   Language:Good    Psychomotor Activity  Psychomotor Activity:Psychomotor Activity: Decreased; Wide Based Gait     Assets  Assets:Communication Skills; Social Support    Sleep  Sleep:Sleep:  Fair     Physical Exam  Physical Exam Vitals and nursing note reviewed.  Constitutional:      General: She is not in acute distress.    Appearance: She is well-developed.  HENT:     Head: Normocephalic and atraumatic.  Eyes:     General: No visual field deficit.    Conjunctiva/sclera: Conjunctivae normal.  Cardiovascular:     Rate and Rhythm: Normal rate and regular rhythm.     Heart sounds: No murmur heard. Pulmonary:     Effort: Pulmonary effort is normal. No respiratory distress.     Breath sounds: Normal breath sounds.  Abdominal:     Palpations: Abdomen is soft.     Tenderness: There is no abdominal tenderness.  Musculoskeletal:        General: No swelling.     Cervical back: Neck supple.  Skin:    General: Skin is warm and dry.     Capillary Refill: Capillary refill takes less than 2 seconds.  Neurological:     Mental Status: She is alert. Mental status is at baseline. She is disoriented.     Cranial Nerves: Cranial nerves 2-12 are intact. No cranial nerve deficit or dysarthria.     Motor: No pronator drift.     Coordination: Coordination is intact. Finger-Nose-Finger Test and Heel to [facility] Test normal.     Gait: Gait abnormal.     Deep Tendon Reflexes: Babinski sign present on the right side. Babinski sign present on the left side.     Reflex Scores:      Bicep reflexes are 2+ on the right side and 2+ on the left side.      Patellar reflexes are 2+ on the right side and 3+ on the left side.    Comments: AxOx3. Does not know month or year Sensory: severe paresthesia from distal thigh extending to feet bilaterally. Numbness in tips of fingers. Appropriate sensation to temperature Motor: 5/5 BUE. 3/5 in proximal BLE secondary to pain and weakness Unsteady gait on ambulation secondary to neuropathy  Psychiatric:        Mood and Affect: Mood normal.    Review of Systems  Respiratory:  Negative for shortness of breath.   Cardiovascular:  Negative for chest pain.   Gastrointestinal:  Negative for abdominal pain, constipation, diarrhea, heartburn, nausea and vomiting.  Neurological:  Positive for tingling and weakness. Negative for seizures and headaches.  Psychiatric/Behavioral:  Positive for depression, substance abuse and suicidal ideas. Negative for hallucinations. The patient is nervous/anxious. The patient does not have insomnia.    Blood pressure 111/78, pulse 67, temperature  97.6 F (36.4 C), temperature source Oral, resp. rate 16, last menstrual period 08/31/2013, SpO2 100 %. There is no height or weight on file to calculate BMI.  ASSESSMENT Patient is a 56 year old female with alcohol use disorder complicated by Wernicke's encephalopathy, MDD, hepatic steatosis, vitamin B1 deficiency neuropathy, and Roux-en-Y presenting to [facility] due to worsening depression, worsening anxiety, alcohol use disorder, and passive SI.  PLAN Alcohol use disorder Wernicke's encephalopathy Alcohol induced neuropathy Concern for Korsakoff   -MVI/Thiamine PO (may require IM thiamine due to roux-en-y) -CIWA protocol -Increase Gabapentin to 300 mg tid for alcohol induced neuropathy  MDD-severe, recurrent -Continue Zoloft 50 mg daily for depressive symptoms  Urinary Incontinence Possibly secondary to neuropathy but appears to only be due to difficulty ambulating to bathroom at night -UA -Continue to monitor  Dispo: Patient continues to require higher level of care (med psych or geropsych).   France Ravens, MD 07/30/2022 1:21 PM

## 2022-07-31 DIAGNOSIS — F1010 Alcohol abuse, uncomplicated: Secondary | ICD-10-CM | POA: Diagnosis not present

## 2022-07-31 DIAGNOSIS — F332 Major depressive disorder, recurrent severe without psychotic features: Secondary | ICD-10-CM | POA: Diagnosis not present

## 2022-07-31 LAB — URINALYSIS, ROUTINE W REFLEX MICROSCOPIC
Bilirubin Urine: NEGATIVE
Glucose, UA: NEGATIVE mg/dL
Hgb urine dipstick: NEGATIVE
Ketones, ur: NEGATIVE mg/dL
Nitrite: NEGATIVE
Protein, ur: NEGATIVE mg/dL
Specific Gravity, Urine: 1.008 (ref 1.005–1.030)
pH: 5 (ref 5.0–8.0)

## 2022-07-31 MED ORDER — THIAMINE HCL 100 MG PO TABS
100.0000 mg | ORAL_TABLET | Freq: Every day | ORAL | Status: DC
  Administered 2022-07-31 – 2022-08-04 (×5): 100 mg via ORAL
  Filled 2022-07-31 (×5): qty 1

## 2022-07-31 NOTE — ED Notes (Signed)
Pt was shown that her keys and phone are in her purse that is in locker #7.  Pt's Car also is in the parking lot. She reports that it will have to be jumped prior to her leaving on Monday.

## 2022-07-31 NOTE — ED Notes (Signed)
Patient is asking where she is - some mild confusion has been noticed and documented but secure message sent to provider to make him aware

## 2022-07-31 NOTE — ED Notes (Signed)
Snacks given 

## 2022-07-31 NOTE — ED Notes (Signed)
Patient continues to sleep with no sxs of distress noted - will continue to monitor for safety

## 2022-07-31 NOTE — Clinical Social Work Psych Note (Signed)
LCSW Update Note   Natalie Mcdaniel reports she is feeling good this morning. Natalie Mcdaniel denied having any SI, HI or AVH at this time.   LCSW informed Natalie Mcdaniel that due to her improvement in the [facility], providers were no longer recommending a higher level of care for inpatient treatment.   LCSW shared with Natalie Mcdaniel that if there were no shelter beds available tomorrow, then she would be discharged to the [facility] for additional resources.   Natalie Mcdaniel continues to report she does not have any alternative housing or shelter options at this time. Natalie Mcdaniel reports she typically sleeps in her car, if she cannot fine a shelter   Natalie Mcdaniel expressed understanding and did not have any additional questions or concerns at this time.    LCSW will continue to follow.    Natalie Mcdaniel, MSW, LCSW Clinical Education officer, museum ([place]) [facility]

## 2022-07-31 NOTE — ED Notes (Signed)
Pt is in the bed sleeping. Respirations are even and unlabored. No acute distress noted. Will continue to monitor for safety. 

## 2022-07-31 NOTE — ED Notes (Signed)
Pt in dayroom with other patients watching television. Pt calm and cooperative. No c/o pain or distress. Will continue to monitor for safety

## 2022-07-31 NOTE — ED Notes (Signed)
Pt. Attending Group and AA

## 2022-07-31 NOTE — ED Provider Notes (Addendum)
FBC Progress Note  Date and Time: 07/31/2022 10:29 AM Name: Natalie Mcdaniel MRN:  [id number]  Reason For Admission: Patient is a 56 year old female with alcohol use disorder complicated by Wernicke's encephalopathy, MDD, hepatic steatosis, vitamin B1 deficiency neuropathy, and Roux-en-Y presenting to [facility] due to worsening depression, worsening anxiety, alcohol use disorder, and passive SI.  Subjective: Patient presented in dayroom.  Patient denies SI/HI/AVH.  Patient reports feeling more optimistic and hopeful. Was irritable due to interaction with nurse. Patient was redirectable. Patient reports neuropathy significantly improved and she's ambulating better.  Patient reports eating appropriately and sleeping well.  Discussed option to go to [facility] and patient reported "I cannot be homeless. I can't live in my car one more day". Discussed going to [facility] would be her best option for housing resources. LCSW to assist patient.   Diagnosis:  Final diagnoses:  Alcohol abuse  Severe episode of recurrent major depressive disorder, without psychotic features (Natalie Mcdaniel)    Total Time spent with patient: 45 minutes   Labs  Lab Results:     Latest Ref Rng & Units 07/27/2022    9:09 AM 07/26/2022    2:19 PM 06/02/2022    3:28 PM  CBC  WBC 4.0 - 10.5 K/uL 9.1  12.0  12.7   Hemoglobin 12.0 - 15.0 g/dL 14.4  13.7  15.2   Hematocrit 36.0 - 46.0 % 41.4  40.5  46.8   Platelets 150 - 400 K/uL 247  305  338       Latest Ref Rng & Units 07/28/2022    5:58 PM 07/27/2022    9:09 AM 07/26/2022    2:19 PM  CMP  Glucose 70 - 99 mg/dL  134  99   BUN 6 - 20 mg/dL  10  17   Creatinine 0.44 - 1.00 mg/dL  0.83  0.87   Sodium 135 - 145 mmol/L  134  132   Potassium 3.5 - 5.1 mmol/L  3.5  3.8   Chloride 98 - 111 mmol/L  97  94   CO2 22 - 32 mmol/L  27  21   Calcium 8.9 - 10.3 mg/dL  9.4  8.7   Total Protein 6.5 - 8.1 g/dL 7.5  7.3  7.4   Total Bilirubin 0.3 - 1.2 mg/dL 0.6  1.9  1.1   Alkaline Phos 38 - 126 U/L 126  135   143   AST 15 - 41 U/L 44  54  50   ALT 0 - 44 U/L '26  25  27     '$ Physical Findings   AUDIT    Flowsheet Row Admission (Discharged) from 12/19/2014 in [place] [facility]  Alcohol Use Disorder Identification Test Final Score (AUDIT) 36      PHQ2-9    Flowsheet Row ED from 07/26/2022 in [facility]  PHQ-2 Total Score 4  PHQ-9 Total Score 10      Flowsheet Row ED from 07/26/2022 in [facility] Most recent reading at 07/27/2022  1:00 PM ED from 07/26/2022 in [place] Most recent reading at 07/26/2022  2:10 PM ED from 06/02/2022 in [place] DEPT Most recent reading at 06/02/2022  2:41 PM  C-SSRS RISK CATEGORY No Risk No Risk No Risk        Musculoskeletal  Strength & Muscle Tone: decreased Gait & Station: unsteady Patient leans: N/A  Psychiatric Specialty Exam  Presentation  General  Appearance: Appropriate for Environment; Casual   Eye Contact:Fair   Speech:Clear and Coherent; Normal Rate   Speech Volume:Normal   Handedness:No data recorded   Mood and Affect  Mood:Euthymic   Affect:Appropriate; Congruent    Thought Process  Thought Processes:Coherent; Goal Directed; Linear   Descriptions of Associations:Intact   Orientation:Partial   Thought Content:Logical   Diagnosis of Schizophrenia or Schizoaffective disorder in past: No     Hallucinations:Hallucinations: None     Ideas of Reference:None   Suicidal Thoughts:Suicidal Thoughts: No     Homicidal Thoughts:Homicidal Thoughts: No      Sensorium  Memory:Immediate Fair; Recent Poor; Remote Poor   Judgment:Fair   Insight:Fair    Executive Functions  Concentration:Fair   Attention Span:Fair   [place] of Knowledge:Good   Language:Good    Psychomotor Activity  Psychomotor Activity:Psychomotor Activity:  Normal      Assets  Assets:Communication Skills; Social Support    Sleep  Sleep:Sleep: Fair      Physical Exam  Physical Exam Vitals and nursing note reviewed.  Constitutional:      General: She is not in acute distress.    Appearance: She is well-developed.  HENT:     Head: Normocephalic and atraumatic.  Eyes:     General: No visual field deficit.    Conjunctiva/sclera: Conjunctivae normal.  Cardiovascular:     Rate and Rhythm: Normal rate and regular rhythm.     Heart sounds: No murmur heard. Pulmonary:     Effort: Pulmonary effort is normal. No respiratory distress.     Breath sounds: Normal breath sounds.  Abdominal:     Palpations: Abdomen is soft.     Tenderness: There is no abdominal tenderness.  Musculoskeletal:        General: No swelling.     Cervical back: Neck supple.  Skin:    General: Skin is warm and dry.     Capillary Refill: Capillary refill takes less than 2 seconds.  Neurological:     Mental Status: She is alert. Mental status is at baseline. She is disoriented.     Cranial Nerves: Cranial nerves 2-12 are intact. No cranial nerve deficit or dysarthria.     Motor: No pronator drift.     Coordination: Coordination is intact. Finger-Nose-Finger Test and Heel to [facility] Test normal.     Gait: Gait normal.     Deep Tendon Reflexes: Babinski sign present on the right side. Babinski sign present on the left side.     Reflex Scores:      Bicep reflexes are 2+ on the right side and 2+ on the left side.      Patellar reflexes are 2+ on the right side and 3+ on the left side.    Comments: AxOx3. Does not know month or year Sensory: severe paresthesia from distal thigh extending to feet bilaterally. Numbness in tips of fingers. Appropriate sensation to temperature Motor: 5/5 BUE. 3/5 in proximal BLE secondary to pain and weakness Unsteady gait on ambulation secondary to neuropathy  Psychiatric:        Mood and Affect: Mood normal.    Review of Systems   Respiratory:  Negative for shortness of breath.   Cardiovascular:  Negative for chest pain.  Gastrointestinal:  Negative for abdominal pain, constipation, diarrhea, heartburn, nausea and vomiting.  Neurological:  Positive for tingling and weakness. Negative for seizures and headaches.  Psychiatric/Behavioral:  Positive for depression, substance abuse and suicidal ideas. Negative for hallucinations. The patient is  nervous/anxious. The patient does not have insomnia.    Blood pressure 127/87, pulse 72, temperature 98.5 F (36.9 C), temperature source Oral, resp. rate 18, last menstrual period 08/31/2013, SpO2 98 %. There is no height or weight on file to calculate BMI.  ASSESSMENT Patient is a 56 year old female with alcohol use disorder complicated by Wernicke's encephalopathy, MDD, hepatic steatosis, vitamin B1 deficiency neuropathy, and Roux-en-Y presenting to [facility] due to worsening depression, worsening anxiety, alcohol use disorder, and passive SI.  PLAN Alcohol use disorder Wernicke's encephalopathy, stable Alcohol induced neuropathy, improving Concern for Korsakoff  -MVI/Thiamine PO (may require IM thiamine due to roux-en-y) -CIWA protocol -Continue Gabapentin 300 mg tid for alcohol induced neuropathy  MDD-severe, recurrent -Continue Zoloft 50 mg daily for depressive symptoms  Urinary Incontinence, resolved Possibly secondary to neuropathy but appears to only be due to difficulty ambulating to bathroom at night, UA wnl -Continue to monitor  Dispo: Patient discharge to [facility] tomorrow if no other housing options   France Ravens, MD 07/31/2022 10:29 AM

## 2022-07-31 NOTE — ED Notes (Signed)
Pt got mad at tech because her coffee was poured, she wasn't allowed to come to the room to make her coffee. she earlier asked for instant coffee she can make but was told that I will get her the coffee. When breakfast was presented, she went off that she have not seen anything like that before, she was expecting something other than a pancake. Tech tried to explain to her but to no avail, nurse was notified.

## 2022-07-31 NOTE — ED Notes (Signed)
New order received - informed patient that the next time she voids, we need a specimen - cup placed at bedside - will continue to monitor

## 2022-07-31 NOTE — ED Notes (Signed)
Pt has become fixated on her keys and her purse. She was informed that these items are locked in locker #7.

## 2022-07-31 NOTE — ED Notes (Signed)
Pt is awake and alert this morning. She has been visible in milieu and eating meals.  Pt does report some forgetfulness ( believing her clothes are in the laundry, and asking where she is).  Pt got upset with [facility] tech and told her that she was rude when [facility] told her that she did not have any belongings in laundry room.  Pt also got upset with [facility] while asking repeatedly for coffee while Tech was attempting to fix her breakfast. Pt got mad because she could not fix coffee herself.        Pt took medications without any problems and after speaking with SW has used the phone to try and find placement.   She is currently redirectable. Denies SI, HI or AVH.  Staff will continue to monitor for safety.

## 2022-08-01 DIAGNOSIS — F332 Major depressive disorder, recurrent severe without psychotic features: Secondary | ICD-10-CM | POA: Diagnosis not present

## 2022-08-01 DIAGNOSIS — F1010 Alcohol abuse, uncomplicated: Secondary | ICD-10-CM | POA: Diagnosis not present

## 2022-08-01 NOTE — ED Notes (Signed)
During my rounds she has started to shut the door every time I open it. I have explained to her a few times already why we have to keep it open and she refuses to keep the door open.

## 2022-08-01 NOTE — ED Notes (Signed)
Progress note   D: Pt seen in dayroom. Pt denies SI, HI, AVH. Pt rates pain  1/10 as chronic neuropathy presenting as pins and needles feeling. Pt rates anxiety  5/10 and depression  0/10. Pt rates day as fair and said she was able to go outside and get fresh air. Pt is pleasant and hopes to be discharged soon.   A: Pt provided support and encouragement. Pt given scheduled medication as prescribed. PRNs as appropriate. Q15 min checks for safety.   R: Pt safe on the unit. Will continue to monitor.

## 2022-08-01 NOTE — ED Notes (Signed)
Pt. Is attending AA. Snacks were offered.

## 2022-08-01 NOTE — ED Notes (Signed)
Pt in dayroom at this hour. No apparent distress. RR even and unlabored. Monitored for safety.  

## 2022-08-01 NOTE — ED Notes (Signed)
Pt resting at this hour. No apparent distress. RR even and unlabored. Monitored for safety.

## 2022-08-01 NOTE — ED Notes (Signed)
Pt. Came to the nurses station complaining of why she has to have her door open. It's not safe to have her door open and I needed to stop opening it. I explained to her that's policy to keep an eye on you while you sleep and we have to keep them open. She said she hasn't gotten any sleep and demanded an answer as to why the door needs to be shut that, that wasn't a good enough reason.

## 2022-08-01 NOTE — Progress Notes (Signed)
Patient attended recovery group run by RN.  She participated well and appears motivated for treatment.

## 2022-08-01 NOTE — ED Notes (Signed)
Patient awake and alert this morning.  She was given clean scrubs and toiletries and showered early.  She ate breakfast and was social with select female peers for a brief time.  Patient has been exhibiting mild to moderate confusion and memory loss.  Patient did not remember Microbiologist today.  She is concerned about her clothing which she states is in her car.  Patient reassured that clothing and property are safe.  Patient is aware of memory loss and has discussed issue with M.D.

## 2022-08-01 NOTE — ED Notes (Signed)
Pt is in the bed sleeping. Respirations are even and unlabored. No acute distress noted. Will continue to monitor for safety. 

## 2022-08-01 NOTE — ED Notes (Addendum)
In a AA group meeting

## 2022-08-01 NOTE — ED Provider Notes (Addendum)
FBC Progress Note  Date and Time: 08/01/2022 10:43 AM Name: Natalie Mcdaniel MRN:  [id number]  Reason For Admission: Patient is a 56 year old female with alcohol use disorder complicated by Wernicke's encephalopathy, MDD, hepatic steatosis, vitamin B1 deficiency neuropathy, and Roux-en-Y presenting to [facility] due to worsening depression, worsening anxiety, alcohol use disorder, and passive SI.  Subjective: Patient presented in dayroom.  Patient denies SI/HI/AVH.  Excited that she will be able to go to [place] on Monday, 08/04/2022.  Patient denies any further depressive symptoms or anxiety symptoms.  Patient reports that she is eating well and sleeping well.  Patient reports concern about dark-colored urine.  Discussed urinalysis was normal and likely thiamine supplementation was contributing to this.  Patient reports that her neuropathy has greatly improved since the admission.  No further questions at this time.  Discussed outpatient neurology for her Wernicke's encephalopathy.  Patient will be discharging 08/04/22 to [place] via her own vehicle.   Diagnosis:  Final diagnoses:  Alcohol abuse  Severe episode of recurrent major depressive disorder, without psychotic features ([place])    Total Time spent with patient: 45 minutes   Labs  Lab Results:     Latest Ref Rng & Units 07/27/2022    9:09 AM 07/26/2022    2:19 PM 06/02/2022    3:28 PM  CBC  WBC 4.0 - 10.5 K/uL 9.1  12.0  12.7   Hemoglobin 12.0 - 15.0 g/dL 14.4  13.7  15.2   Hematocrit 36.0 - 46.0 % 41.4  40.5  46.8   Platelets 150 - 400 K/uL 247  305  338       Latest Ref Rng & Units 07/28/2022    5:58 PM 07/27/2022    9:09 AM 07/26/2022    2:19 PM  CMP  Glucose 70 - 99 mg/dL  134  99   BUN 6 - 20 mg/dL  10  17   Creatinine 0.44 - 1.00 mg/dL  0.83  0.87   Sodium 135 - 145 mmol/L  134  132   Potassium 3.5 - 5.1 mmol/L  3.5  3.8   Chloride 98 - 111 mmol/L  97  94   CO2 22 - 32 mmol/L  27  21   Calcium 8.9 - 10.3 mg/dL  9.4  8.7    Total Protein 6.5 - 8.1 g/dL 7.5  7.3  7.4   Total Bilirubin 0.3 - 1.2 mg/dL 0.6  1.9  1.1   Alkaline Phos 38 - 126 U/L 126  135  143   AST 15 - 41 U/L 44  54  50   ALT 0 - 44 U/L '26  25  27     '$ Physical Findings   AUDIT    Flowsheet Row Admission (Discharged) from 12/19/2014 in [facility]  Alcohol Use Disorder Identification Test Final Score (AUDIT) 36      PHQ2-9    Flowsheet Row ED [facility] 07/26/2022 in [facility]  PHQ-2 Total Score 2  PHQ-9 Total Score [place] ED from 07/26/2022 in [facility] Most recent reading at 07/27/2022  1:00 PM ED from 07/26/2022 in [place] Most recent reading at 07/26/2022  2:10 PM ED from 06/02/2022 in [place] DEPT Most recent reading at 06/02/2022  2:41 PM  C-SSRS RISK CATEGORY No Risk No Risk No Risk  Musculoskeletal  Strength & Muscle Tone: decreased Gait & Station: unsteady Patient leans: N/A  Psychiatric Specialty Exam  Presentation  General Appearance: Appropriate for Environment; Casual   Eye Contact:Fair   Speech:Clear and Coherent; Normal Rate   Speech Volume:Normal   Handedness:No data recorded   Mood and Affect  Mood:Euthymic   Affect:Appropriate; Congruent    Thought Process  Thought Processes:Coherent; Goal Directed; Linear   Descriptions of Associations:Intact   Orientation:Partial   Thought Content:Logical   Diagnosis of Schizophrenia or Schizoaffective disorder in past: No     Hallucinations:Hallucinations: None     Ideas of Reference:None   Suicidal Thoughts:Suicidal Thoughts: No     Homicidal Thoughts:Homicidal Thoughts: No      Sensorium  Memory:Immediate Fair; Recent Poor; Remote Poor   Judgment:Fair   Insight:Fair    Executive Functions  Concentration:Fair   Attention  Span:Fair   [place] of Knowledge:Good   Language:Good    Psychomotor Activity  Psychomotor Activity:Psychomotor Activity: Normal      Assets  Assets:Communication Skills; Social Support    Sleep  Sleep:Sleep: Fair      Physical Exam  Physical Exam Vitals and nursing note reviewed.  Constitutional:      General: She is not in acute distress.    Appearance: She is well-developed.  HENT:     Head: Normocephalic and atraumatic.  Eyes:     General: No visual field deficit.    Conjunctiva/sclera: Conjunctivae normal.  Cardiovascular:     Rate and Rhythm: Normal rate and regular rhythm.     Heart sounds: No murmur heard. Pulmonary:     Effort: Pulmonary effort is normal. No respiratory distress.     Breath sounds: Normal breath sounds.  Abdominal:     Palpations: Abdomen is soft.     Tenderness: There is no abdominal tenderness.  Musculoskeletal:        General: No swelling.     Cervical back: Neck supple.  Skin:    General: Skin is warm and dry.     Capillary Refill: Capillary refill takes less than 2 seconds.  Neurological:     Mental Status: She is alert. Mental status is at baseline. She is disoriented.     Cranial Nerves: Cranial nerves 2-12 are intact. No cranial nerve deficit or dysarthria.     Motor: No pronator drift.     Coordination: Coordination is intact. Finger-Nose-Finger Test and Heel to [facility] Test normal.     Gait: Gait normal.     Deep Tendon Reflexes: Babinski sign present on the right side. Babinski sign present on the left side.     Reflex Scores:      Bicep reflexes are 2+ on the right side and 2+ on the left side.      Patellar reflexes are 2+ on the right side and 3+ on the left side.    Comments: AxOx3. Does not know month or year Sensory: Improving paresthesia in BLE.  Minimal numbness in tips of fingers. Appropriate sensation to temperature Motor: 5/5 BUE.  4 out of 5 in proximal BLE secondary to pain and  weakness Improving gait on ambulation  Psychiatric:        Mood and Affect: Mood normal.    Review of Systems  Respiratory:  Negative for shortness of breath.   Cardiovascular:  Negative for chest pain.  Gastrointestinal:  Negative for abdominal pain, constipation, diarrhea, heartburn, nausea and vomiting.  Neurological:  Positive for tingling and weakness. Negative for  seizures and headaches.  Psychiatric/Behavioral:  Negative for depression, hallucinations, substance abuse and suicidal ideas. The patient is not nervous/anxious and does not have insomnia.    Blood pressure 125/88, pulse 64, temperature 98.3 F (36.8 C), temperature source Oral, resp. rate 18, last menstrual period 08/31/2013, SpO2 100 %. There is no height or weight on file to calculate BMI.  ASSESSMENT Patient is a 56 year old female with alcohol use disorder complicated by Wernicke's encephalopathy, MDD, hepatic steatosis, vitamin B1 deficiency neuropathy, and Roux-en-Y presenting to [facility] due to worsening depression, worsening anxiety, alcohol use disorder, and passive SI.  PLAN Alcohol use disorder Wernicke's encephalopathy, stable Alcohol induced neuropathy, improving Concern for [place]off  -MVI/Thiamine PO -CIWA protocol -Continue Gabapentin 300 mg tid for alcohol induced neuropathy  MDD-severe, recurrent -Continue Zoloft 50 mg daily for depressive symptoms  Urinary Incontinence, resolved UA wnl.  -Continue to monitor  Dispo: [facility] 08/04/2022   France Ravens, MD 08/01/2022 10:43 AM

## 2022-08-01 NOTE — ED Notes (Signed)
Pt watching movie with peer

## 2022-08-01 NOTE — ED Notes (Signed)
Snacks given 

## 2022-08-01 NOTE — Clinical Social Work Psych Note (Signed)
LCSW Update Note  Demia reports she is feeling "okay" this morning. Anye denied having any SI, HI or AVH at this time.   Shivali continues to anticipate her interview at an [employer] on Monday at 5:00PM. Trinaty has confirmed that her car is in the [facility] parking lot and that she plans on driving directly to [facility] once she is discharged.   Avnoor denied having any additional questions or concerns at this time.   LCSW will continue to follow until discharge.   Radonna Ricker, MSW, LCSW Clinical Education officer, museum ([place]) [facility]

## 2022-08-02 DIAGNOSIS — F332 Major depressive disorder, recurrent severe without psychotic features: Secondary | ICD-10-CM | POA: Diagnosis not present

## 2022-08-02 DIAGNOSIS — F1010 Alcohol abuse, uncomplicated: Secondary | ICD-10-CM | POA: Diagnosis not present

## 2022-08-02 MED ORDER — LOPERAMIDE HCL 2 MG PO CAPS: 2.0000 mg | ORAL_CAPSULE | ORAL | Status: DC | PRN

## 2022-08-02 NOTE — Progress Notes (Signed)
Patient awake and alert sitting in the day room with peers.  She ate lunch and is interacting with peers.  Patient continues to have memory issues and needs reminders and prompts.  Patient makes needs known and can be demanding at times.  She unplugged the ice machine because it was "making too much noise and she could not hear the TV."  Patient was redirected and educated as to why she cannot handle the electrical cord to the machine.  She verbalized understanding and reassured staff she would not do it again.  Patient encouraged to seek staff to have needs met.  Will continue to monitor, provide prompts and reminders as needed.  No withdrawal or somatic issues.

## 2022-08-02 NOTE — ED Notes (Signed)
Pt is attending AA and Group.

## 2022-08-02 NOTE — ED Notes (Signed)
Pt composed and in her room at this hour. No apparent distress. RR even and unlabored. Monitored for safety.

## 2022-08-02 NOTE — ED Notes (Signed)
Coffee given x3

## 2022-08-02 NOTE — ED Notes (Signed)
Patient was awake early for medication and then went back to bed where she is resting at present.  She remains with on-going confusion and forgetfulness.  However she is pleasant and able to make needs known.  Will monitor and provide support as needs
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
## 2022-08-02 NOTE — ED Notes (Signed)
Pt is in the bed sleeping. Respirations are even and unlabored. No acute distress noted. Will continue to monitor for safety. 

## 2022-08-02 NOTE — ED Notes (Signed)
Pt was watching TV and then went and unplugged the [employer] from the socket. The Tech told her that she can't do that and she got mad saying that the Tech can't tell what to do that she can't hear the TV loud enough.

## 2022-08-02 NOTE — ED Notes (Signed)
Pt complained that I give her a fresh cup of coffee anytime she ask me for coffee

## 2022-08-02 NOTE — ED Notes (Signed)
Patient participating well in [place] groups. Patient is cooperative and interacts well with staff. Respiratory is even and unlabored. No distress noted.  will continue to monitor for safety.

## 2022-08-02 NOTE — ED Notes (Signed)
Patient is resting comfortably. 

## 2022-08-02 NOTE — ED Notes (Signed)
Pt continues to find fault in everything including the meal

## 2022-08-03 DIAGNOSIS — F1010 Alcohol abuse, uncomplicated: Secondary | ICD-10-CM | POA: Diagnosis not present

## 2022-08-03 DIAGNOSIS — F332 Major depressive disorder, recurrent severe without psychotic features: Secondary | ICD-10-CM | POA: Diagnosis not present

## 2022-08-03 NOTE — ED Notes (Signed)
Pt sitting in dayroom playing cards.  She is interacting appropriately with peers with no distress noted.  Staff will continue q 15 min checks for safety.

## 2022-08-03 NOTE — ED Notes (Signed)
Patient sleeping with no sxs of distress noted at this time - will continue to monitor for safety
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
## 2022-08-03 NOTE — ED Notes (Signed)
Pt was given lunch

## 2022-08-03 NOTE — ED Notes (Signed)
Pt is awake and alert. She is sitting in day room and has eaten breakfast.  Pt denies SI, HI or AVH.  She has been taking medications without incident.  Staff will continue  Q 15 min checks.

## 2022-08-03 NOTE — ED Notes (Signed)
Pt is sitting up in dayroom watching tv.  Denies SI, HI or AVH.  Pt has been interacting appropriately with peers.   Q 15 min in place. Pt reports that she is looking forward to discharge tomorrow.  Staff will continue to monitor for safety.

## 2022-08-03 NOTE — ED Notes (Signed)
Patient sleeping - no sxs of distress noted - will continue to monitor for safety 

## 2022-08-04 ENCOUNTER — Emergency Department (HOSPITAL_COMMUNITY)
Admission: EM | Admit: 2022-08-04 | Discharge: 2022-08-05 | Disposition: A | Discharge status: Home / Self Care | Payer: 59 | Attending: Emergency Medicine | Admitting: Emergency Medicine

## 2022-08-04 ENCOUNTER — Other Ambulatory Visit: Payer: Self-pay

## 2022-08-04 ENCOUNTER — Encounter (HOSPITAL_COMMUNITY): Payer: Self-pay

## 2022-08-04 DIAGNOSIS — Z5900 Homelessness unspecified: Secondary | ICD-10-CM

## 2022-08-04 DIAGNOSIS — R202 Paresthesia of skin: Secondary | ICD-10-CM | POA: Diagnosis not present

## 2022-08-04 DIAGNOSIS — R55 Syncope and collapse: Secondary | ICD-10-CM | POA: Diagnosis present

## 2022-08-04 DIAGNOSIS — R61 Generalized hyperhidrosis: Secondary | ICD-10-CM | POA: Diagnosis not present

## 2022-08-04 DIAGNOSIS — F332 Major depressive disorder, recurrent severe without psychotic features: Secondary | ICD-10-CM | POA: Diagnosis not present

## 2022-08-04 DIAGNOSIS — F1010 Alcohol abuse, uncomplicated: Secondary | ICD-10-CM | POA: Diagnosis not present

## 2022-08-04 LAB — COMPREHENSIVE METABOLIC PANEL
ALT: 18 U/L (ref 0–44)
AST: 19 U/L (ref 15–41)
Albumin: 4.3 g/dL (ref 3.5–5.0)
Alkaline Phosphatase: 97 U/L (ref 38–126)
Anion gap: 6 (ref 5–15)
BUN: 16 mg/dL (ref 6–20)
CO2: 25 mmol/L (ref 22–32)
Calcium: 9.3 mg/dL (ref 8.9–10.3)
Chloride: 109 mmol/L (ref 98–111)
Creatinine, Ser: 0.91 mg/dL (ref 0.44–1.00)
GFR, Estimated: >60 mL/min (ref >60)
Glucose, Bld: 108 mg/dL — ABNORMAL HIGH (ref 70–99)
Potassium: 3.9 mmol/L (ref 3.5–5.1)
Sodium: 140 mmol/L (ref 135–145)
Total Bilirubin: 0.3 mg/dL (ref 0.3–1.2)
Total Protein: 7.3 g/dL (ref 6.5–8.1)

## 2022-08-04 LAB — CBC WITH DIFFERENTIAL/PLATELET
Abs Immature Granulocytes: 0.02 10*3/uL (ref 0.00–0.07)
Basophils Absolute: 0 10*3/uL (ref 0.0–0.1)
Basophils Relative: 0 %
Eosinophils Absolute: 0.1 10*3/uL (ref 0.0–0.5)
Eosinophils Relative: 1 %
HCT: 38.6 % (ref 36.0–46.0)
Hemoglobin: 12.9 g/dL (ref 12.0–15.0)
Immature Granulocytes: 0 %
Lymphocytes Relative: 47 %
Lymphs Abs: 4.1 10*3/uL — ABNORMAL HIGH (ref 0.7–4.0)
MCH: 31.2 pg (ref 26.0–34.0)
MCHC: 33.4 g/dL (ref 30.0–36.0)
MCV: 93.5 fL (ref 80.0–100.0)
Monocytes Absolute: 0.7 10*3/uL (ref 0.1–1.0)
Monocytes Relative: 8 %
Neutro Abs: 3.9 10*3/uL (ref 1.7–7.7)
Neutrophils Relative %: 44 %
Platelets: 279 10*3/uL (ref 150–400)
RBC: 4.13 MIL/uL (ref 3.87–5.11)
RDW: 14.5 % (ref 11.5–15.5)
WBC: 8.9 10*3/uL (ref 4.0–10.5)
nRBC: 0 % (ref 0.0–0.2)

## 2022-08-04 LAB — CBG MONITORING, ED: Glucose-Capillary: 97 mg/dL (ref 70–99)

## 2022-08-04 MED ORDER — ADULT MULTIVITAMIN W/MINERALS CH: 1.0000 | ORAL_TABLET | Freq: Every day | ORAL | Status: DC

## 2022-08-04 MED ORDER — GABAPENTIN 300 MG PO CAPS: 300.0000 mg | ORAL_CAPSULE | Freq: Three times a day (TID) | ORAL | 0 refills | Status: DC

## 2022-08-04 MED ORDER — THIAMINE HCL 100 MG PO TABS: 100.0000 mg | ORAL_TABLET | Freq: Every day | ORAL | Status: DC

## 2022-08-04 MED ORDER — TRAZODONE HCL 50 MG PO TABS: 50.0000 mg | ORAL_TABLET | Freq: Every evening | ORAL | 0 refills | Status: DC | PRN

## 2022-08-04 MED ORDER — SERTRALINE HCL 50 MG PO TABS: 50.0000 mg | ORAL_TABLET | Freq: Every day | ORAL | 0 refills | Status: DC

## 2022-08-04 NOTE — Discharge Summary (Signed)
Natalie Mcdaniel to be D/C'd Home per MD order. Discussed with the patient and all questions fully answered. An After Visit Summary was printed and given to the patient. Medication scripts were also given to patient. Patient escorted out and D/C home via private auto.  Natalie Mcdaniel  08/04/2022 2:25 PM

## 2022-08-04 NOTE — Progress Notes (Signed)
Pt is awake, alert and oriented. Pt did not voice any complaints of pain or discomfort. No distress noted. Administered scheduled meds with no issue. Pt denies current SI/HI/AVH. Staff will will monitor for pt's safety.

## 2022-08-04 NOTE — Discharge Instructions (Addendum)
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
## 2022-08-04 NOTE — ED Notes (Signed)
Patient sleeping with no sxs of distress noted - will continue to monitor for safety 

## 2022-08-05 MED ORDER — LACTATED RINGERS IV BOLUS
1000.0000 mL | Freq: Once | INTRAVENOUS | Status: DC
Start: 2022-08-05 — End: 2022-08-05

## 2022-08-05 MED ORDER — ONDANSETRON HCL 4 MG/2ML IJ SOLN: 4.0000 mg | Freq: Once | INTRAMUSCULAR | Status: DC
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
## 2022-08-05 NOTE — Discharge Instructions (Addendum)
Have someone stay with you until you feel stable. Do not drive, operate machinery, or play sports until your caregiver says it is okay. Keep all follow-up appointments as directed by your caregiver. Lie down right away if you start feeling like you might faint. Breathe deeply and steadily. Wait until all the symptoms have passed.Drink enough fluids to keep your urine clear or pale yellow. If you are taking blood pressure or heart medicine, get up slowly, taking several minutes to sit and then stand. This can reduce dizziness. SEEK IMMEDIATE MEDICAL CARE IF: You have a severe headache. You have unusual pain in the chest, abdomen, or back. You are bleeding from the mouth or rectum, or you have a black or tarry stool. You have an irregular or very fast heartbeat. You have pain with breathing. You have repeated fainting or seizure-like jerking during an episode. You faint when sitting or lying down. You have confusion. You have difficulty walking. You have severe weakness. You have vision problems. If you fainted, call your local emergency services - do not drive yourself to the hospital.   Please return to the emergency department immediately for any new or concerning symptoms, or if you get worse.

## 2022-08-05 NOTE — ED Notes (Signed)
Pt. Given water for fluid challenge

## 2022-08-08 ENCOUNTER — Other Ambulatory Visit: Payer: Self-pay

## 2022-08-08 ENCOUNTER — Emergency Department (HOSPITAL_COMMUNITY)
Admission: EM | Admit: 2022-08-08 | Discharge: 2022-08-08 | Disposition: A | Discharge status: Home / Self Care | Payer: 59 | Attending: Emergency Medicine | Admitting: Emergency Medicine

## 2022-08-08 ENCOUNTER — Encounter (HOSPITAL_COMMUNITY): Payer: Self-pay

## 2022-08-08 DIAGNOSIS — F10920 Alcohol use, unspecified with intoxication, uncomplicated: Secondary | ICD-10-CM | POA: Diagnosis present

## 2022-08-08 DIAGNOSIS — R7309 Other abnormal glucose: Secondary | ICD-10-CM | POA: Diagnosis not present

## 2022-08-08 NOTE — ED Notes (Signed)
Pt stated that she wanted to leave and walked out the front door. Staff attempted to have her stay to be seen, but she stated she was leaving.

## 2022-08-08 NOTE — ED Provider Notes (Signed)
[facility] DEPT Provider Note   CSN: [id number] Arrival date & time: 08/08/22  1700     History  Chief Complaint  Patient presents with   Alcohol Intoxication    Natalie Mcdaniel is a 56 y.o. female.   Alcohol Intoxication  Patient is a 56 year old female presented emergency room today she states that she is somewhat uncertain why she is here.  She states that she feels fine apart from some chronic foot and hand pain which she states is unchanged today.  She denies any falls or injuries.  She states she drinks some wine earlier today but think she only drank a few glasses.  It seems that she has some baseline cognitive issues as result of heavy alcohol use denies any nausea or vomiting.  She denies any weakness fatigue lightheadedness dizziness chest pain difficulty breathing abdominal pain or any other associated symptoms.  She states she is uncertain where her car is and she would like some assistance in finding this but has no other additional concerns.    Home Medications Prior to Admission medications   Medication Sig Start Date End Date Taking? Authorizing Provider  gabapentin (NEURONTIN) 300 MG capsule Take 1 capsule (300 mg total) by mouth 3 (three) times daily. 08/04/22 09/03/22  France Ravens, MD  Multiple Vitamin (MULTIVITAMIN WITH MINERALS) TABS tablet Take 1 tablet by mouth daily. 08/05/22   France Ravens, MD  sertraline (ZOLOFT) 50 MG tablet Take 1 tablet (50 mg total) by mouth daily. 08/05/22 09/04/22  France Ravens, MD  thiamine (VITAMIN B1) 100 MG tablet Take 1 tablet (100 mg total) by mouth daily. 08/05/22   France Ravens, MD  traZODone (DESYREL) 50 MG tablet Take 1 tablet (50 mg total) by mouth at bedtime as needed for sleep. 08/04/22   France Ravens, MD      Allergies    Bee venom and Wasp venom    Review of Systems   Review of Systems  Physical Exam Updated Vital Signs BP 110/84 (BP Location: Left Arm)   Pulse 97   Temp 99.1 F (37.3 C)  (Oral)   Resp 18   LMP 08/31/2013   SpO2 96%  Physical Exam Vitals and nursing note reviewed.  Constitutional:      General: She is not in acute distress. HENT:     Head: Normocephalic and atraumatic.     Nose: Nose normal.     Mouth/Throat:     Mouth: Mucous membranes are moist.  Eyes:     General: No scleral icterus.    Comments: No scleral icterus  Cardiovascular:     Rate and Rhythm: Normal rate and regular rhythm.     Pulses: Normal pulses.     Heart sounds: Normal heart sounds.  Pulmonary:     Effort: Pulmonary effort is normal. No respiratory distress.     Breath sounds: No wheezing.  Abdominal:     Palpations: Abdomen is soft.     Tenderness: There is no abdominal tenderness. There is no guarding or rebound.  Musculoskeletal:     Cervical back: Normal range of motion.     Right lower leg: No edema.     Left lower leg: No edema.     Comments: No bony tenderness over joints or long bones of the upper and lower extremities.    No neck or back midline tenderness, step-off, deformity, or bruising. Able to turn head left and right 45 degrees without difficulty.  Full range of motion  of upper and lower extremity joints shown after palpation was conducted; with 5/5 symmetrical strength in upper and lower extremities. No chest wall tenderness, no facial or cranial tenderness.   Patient has intact sensation grossly in lower and upper extremities. Intact patellar and ankle reflexes. Patient able to ambulate without difficulty.  Radial and DP pulses palpated BL.    Skin:    General: Skin is warm and dry.     Capillary Refill: Capillary refill takes less than 2 seconds.  Neurological:     Mental Status: She is alert. Mental status is at baseline.     Comments: Alert and oriented to self, place, time and event.   Speech is fluent, clear without dysarthria or dysphasia.   Strength 5/5 in upper/lower extremities   Sensation intact in upper/lower extremities   Normal gait.   CN I not tested  CN II grossly intact visual fields bilaterally. Did not visualize posterior eye.  CN III, IV, VI PERRLA and EOMs intact bilaterally  CN V Intact sensation to sharp and light touch to the face  CN VII facial movements symmetric  CN VIII not tested  CN IX, X no uvula deviation, symmetric rise of soft palate  CN XI 5/5 SCM and trapezius strength bilaterally  CN XII Midline tongue protrusion, symmetric L/R movements   Psychiatric:        Mood and Affect: Mood normal.        Behavior: Behavior normal.     ED Results / Procedures / Treatments   Labs (all labs ordered are listed, but only abnormal results are displayed) Labs Reviewed  CBG MONITORING, ED    EKG None  Radiology No results found.  Procedures Procedures    Medications Ordered in ED Medications - No data to display  ED Course/ Medical Decision Making/ A&P                           Medical Decision Making  Patient is a 56 year old female presented emergency room today she states that she is somewhat uncertain why she is here.  She states that she feels fine apart from some chronic foot and hand pain which she states is unchanged today.  She denies any falls or injuries.  She states she drinks some wine earlier today but think she only drank a few glasses.  It seems that she has some baseline cognitive issues as result of heavy alcohol use denies any nausea or vomiting.  She denies any weakness fatigue lightheadedness dizziness chest pain difficulty breathing abdominal pain or any other associated symptoms.  She states she is uncertain where her car is and she would like some assistance in finding this but has no other additional concerns.  She states that she drinks alcohol most days but states that she mostly drinks between 2 and 3 glasses of wine or occasionally similar serving sizes of beer.  I reviewed patient's labs from 4 days ago she was without any hyper or hyponatremia which has been an  issue for her in the past.  She seems to be at her mental baseline.  She states she feels well.  Clinically she does not appear intoxicated.  She also does not seem to be withdrawing she is not hypertensive she is not having nausea vomiting confusion or any other symptoms.  Did not fall or suffer any traumatic injuries that are apparent on my physical exam.  We will check CBG and anticipate discharge  home.  Patient eloped before CBG was obtained.  Nursing staff is uncertain where patient went but reports she walked without difficulty.  Final Clinical Impression(s) / ED Diagnoses Final diagnoses:  Alcoholic intoxication without complication [facility])    Rx / DC Orders ED Discharge Orders     None         Tedd Sias, [place] 08/08/22 1917    Hayden Rasmussen, MD 08/09/22 1558
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
## 2022-08-08 NOTE — Discharge Instructions (Signed)
Please follow-up with the outpatient substance use information/offices and I have given you.

## 2022-08-12 ENCOUNTER — Encounter (HOSPITAL_COMMUNITY): Payer: Self-pay

## 2022-08-12 ENCOUNTER — Emergency Department (HOSPITAL_COMMUNITY)
Admission: EM | Admit: 2022-08-12 | Discharge: 2022-08-12 | Discharge status: Against Medical Advice | Payer: 59 | Attending: Student | Admitting: Student

## 2022-08-12 DIAGNOSIS — R0602 Shortness of breath: Secondary | ICD-10-CM | POA: Diagnosis not present

## 2022-08-12 DIAGNOSIS — R42 Dizziness and giddiness: Secondary | ICD-10-CM | POA: Insufficient documentation

## 2022-08-12 DIAGNOSIS — Z5321 Procedure and treatment not carried out due to patient leaving prior to being seen by health care provider: Secondary | ICD-10-CM | POA: Diagnosis not present

## 2022-08-12 DIAGNOSIS — R55 Syncope and collapse: Secondary | ICD-10-CM | POA: Diagnosis present

## 2022-08-12 LAB — CBC WITH DIFFERENTIAL/PLATELET
Abs Immature Granulocytes: 0.01 10*3/uL (ref 0.00–0.07)
Basophils Absolute: 0.1 10*3/uL (ref 0.0–0.1)
Basophils Relative: 1 %
Eosinophils Absolute: 0.1 10*3/uL (ref 0.0–0.5)
Eosinophils Relative: 1 %
HCT: 42.8 % (ref 36.0–46.0)
Hemoglobin: 13.9 g/dL (ref 12.0–15.0)
Immature Granulocytes: 0 %
Lymphocytes Relative: 45 %
Lymphs Abs: 4.1 10*3/uL — ABNORMAL HIGH (ref 0.7–4.0)
MCH: 30.6 pg (ref 26.0–34.0)
MCHC: 32.5 g/dL (ref 30.0–36.0)
MCV: 94.3 fL (ref 80.0–100.0)
Monocytes Absolute: 0.4 10*3/uL (ref 0.1–1.0)
Monocytes Relative: 5 %
Neutro Abs: 4.4 10*3/uL (ref 1.7–7.7)
Neutrophils Relative %: 48 %
Platelets: 399 10*3/uL (ref 150–400)
RBC: 4.54 MIL/uL (ref 3.87–5.11)
RDW: 15.4 % (ref 11.5–15.5)
WBC: 9.2 10*3/uL (ref 4.0–10.5)
nRBC: 0 % (ref 0.0–0.2)

## 2022-08-12 LAB — COMPREHENSIVE METABOLIC PANEL
ALT: 17 U/L (ref 0–44)
AST: 22 U/L (ref 15–41)
Albumin: 4.4 g/dL (ref 3.5–5.0)
Alkaline Phosphatase: 123 U/L (ref 38–126)
Anion gap: 10 (ref 5–15)
BUN: 10 mg/dL (ref 6–20)
CO2: 23 mmol/L (ref 22–32)
Calcium: 8.6 mg/dL — ABNORMAL LOW (ref 8.9–10.3)
Chloride: 113 mmol/L — ABNORMAL HIGH (ref 98–111)
Creatinine, Ser: 0.89 mg/dL (ref 0.44–1.00)
GFR, Estimated: >60 mL/min (ref >60)
Glucose, Bld: 114 mg/dL — ABNORMAL HIGH (ref 70–99)
Potassium: 4 mmol/L (ref 3.5–5.1)
Sodium: 146 mmol/L — ABNORMAL HIGH (ref 135–145)
Total Bilirubin: 0.4 mg/dL (ref 0.3–1.2)
Total Protein: 7.9 g/dL (ref 6.5–8.1)

## 2022-08-12 LAB — ETHANOL: Alcohol, Ethyl (B): 333 mg/dL (ref <10)

## 2022-08-12 LAB — TROPONIN I (HIGH SENSITIVITY): Troponin I (High Sensitivity): 3 ng/L (ref <18)

## 2022-08-12 LAB — CBG MONITORING, ED: Glucose-Capillary: 180 mg/dL — ABNORMAL HIGH (ref 70–99)
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
## 2022-08-12 NOTE — ED Notes (Addendum)
Urine cup provided. Pt unable to provide urine sample at this time.

## 2022-08-12 NOTE — ED Notes (Addendum)
Critical Lab Value  Ethanol 333 Reported C. Prosperi

## 2022-08-16 ENCOUNTER — Encounter (HOSPITAL_COMMUNITY): Payer: Self-pay

## 2022-08-16 ENCOUNTER — Emergency Department (HOSPITAL_COMMUNITY)
Admission: EM | Admit: 2022-08-16 | Discharge: 2022-08-17 | Disposition: A | Discharge status: Home / Self Care | Payer: 59 | Attending: Emergency Medicine | Admitting: Emergency Medicine

## 2022-08-16 ENCOUNTER — Other Ambulatory Visit: Payer: Self-pay

## 2022-08-16 DIAGNOSIS — F10129 Alcohol abuse with intoxication, unspecified: Secondary | ICD-10-CM | POA: Diagnosis present

## 2022-08-16 DIAGNOSIS — R600 Localized edema: Secondary | ICD-10-CM | POA: Insufficient documentation

## 2022-08-16 DIAGNOSIS — F10920 Alcohol use, unspecified with intoxication, uncomplicated: Secondary | ICD-10-CM

## 2022-08-16 DIAGNOSIS — R519 Headache, unspecified: Secondary | ICD-10-CM | POA: Insufficient documentation

## 2022-08-16 DIAGNOSIS — F1010 Alcohol abuse, uncomplicated: Secondary | ICD-10-CM

## 2022-08-16 DIAGNOSIS — Y92410 Unspecified street and highway as the place of occurrence of the external cause: Secondary | ICD-10-CM | POA: Diagnosis not present
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
## 2022-08-17 ENCOUNTER — Emergency Department (HOSPITAL_COMMUNITY): Payer: 59

## 2022-08-17 MED ORDER — NAPROXEN 500 MG PO TABS: 500.0000 mg | ORAL_TABLET | Freq: Two times a day (BID) | ORAL | 0 refills | Status: DC

## 2022-08-17 MED ORDER — ACETAMINOPHEN 325 MG PO TABS
650.0000 mg | ORAL_TABLET | Freq: Once | ORAL | Status: AC
  Administered 2022-08-17: 650 mg via ORAL
  Filled 2022-08-17: qty 2
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
## 2022-08-17 NOTE — ED Notes (Signed)
Spoke w/ Natalie Mcdaniel to see if she would be open to blood draw for basic labs per [place], [place]. Natalie Mcdaniel stated she did not believe it necessary. Elmo Putt, [place] aware.
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
## 2022-08-18 ENCOUNTER — Encounter (HOSPITAL_COMMUNITY): Payer: Self-pay

## 2022-08-18 ENCOUNTER — Emergency Department (HOSPITAL_COMMUNITY)
Admission: EM | Admit: 2022-08-18 | Discharge: 2022-08-19 | Disposition: A | Discharge status: Home / Self Care | Payer: 59 | Attending: Emergency Medicine | Admitting: Emergency Medicine

## 2022-08-18 DIAGNOSIS — Z20822 Contact with and (suspected) exposure to covid-19: Secondary | ICD-10-CM | POA: Diagnosis not present

## 2022-08-18 DIAGNOSIS — F1020 Alcohol dependence, uncomplicated: Secondary | ICD-10-CM | POA: Insufficient documentation

## 2022-08-18 DIAGNOSIS — F332 Major depressive disorder, recurrent severe without psychotic features: Secondary | ICD-10-CM | POA: Insufficient documentation

## 2022-08-18 DIAGNOSIS — Z046 Encounter for general psychiatric examination, requested by authority: Secondary | ICD-10-CM | POA: Diagnosis present

## 2022-08-18 DIAGNOSIS — R45851 Suicidal ideations: Secondary | ICD-10-CM | POA: Diagnosis not present

## 2022-08-18 LAB — CBC WITH DIFFERENTIAL/PLATELET
Abs Immature Granulocytes: 0.02 10*3/uL (ref 0.00–0.07)
Basophils Absolute: 0 10*3/uL (ref 0.0–0.1)
Basophils Relative: 0 %
Eosinophils Absolute: 0 10*3/uL (ref 0.0–0.5)
Eosinophils Relative: 0 %
HCT: 42 % (ref 36.0–46.0)
Hemoglobin: 13.5 g/dL (ref 12.0–15.0)
Immature Granulocytes: 0 %
Lymphocytes Relative: 20 %
Lymphs Abs: 1.7 10*3/uL (ref 0.7–4.0)
MCH: 30.8 pg (ref 26.0–34.0)
MCHC: 32.1 g/dL (ref 30.0–36.0)
MCV: 95.7 fL (ref 80.0–100.0)
Monocytes Absolute: 0.4 10*3/uL (ref 0.1–1.0)
Monocytes Relative: 4 %
Neutro Abs: 6.4 10*3/uL (ref 1.7–7.7)
Neutrophils Relative %: 76 %
Platelets: 289 10*3/uL (ref 150–400)
RBC: 4.39 MIL/uL (ref 3.87–5.11)
RDW: 14.7 % (ref 11.5–15.5)
WBC: 8.6 10*3/uL (ref 4.0–10.5)
nRBC: 0 % (ref 0.0–0.2)

## 2022-08-18 LAB — COMPREHENSIVE METABOLIC PANEL
ALT: 14 U/L (ref 0–44)
AST: 17 U/L (ref 15–41)
Albumin: 4.3 g/dL (ref 3.5–5.0)
Alkaline Phosphatase: 122 U/L (ref 38–126)
Anion gap: 10 (ref 5–15)
BUN: 15 mg/dL (ref 6–20)
CO2: 23 mmol/L (ref 22–32)
Calcium: 9.3 mg/dL (ref 8.9–10.3)
Chloride: 104 mmol/L (ref 98–111)
Creatinine, Ser: 0.75 mg/dL (ref 0.44–1.00)
GFR, Estimated: >60 mL/min (ref >60)
Glucose, Bld: 75 mg/dL (ref 70–99)
Potassium: 4 mmol/L (ref 3.5–5.1)
Sodium: 137 mmol/L (ref 135–145)
Total Bilirubin: 1.1 mg/dL (ref 0.3–1.2)
Total Protein: 7.9 g/dL (ref 6.5–8.1)

## 2022-08-18 LAB — MAGNESIUM: Magnesium: 2 mg/dL (ref 1.7–2.4)

## 2022-08-18 LAB — RESP PANEL BY RT-PCR (FLU A&B, COVID) ARPGX2
Influenza A by PCR: NEGATIVE
Influenza B by PCR: NEGATIVE
SARS Coronavirus 2 by RT PCR: NEGATIVE

## 2022-08-18 LAB — ETHANOL: Alcohol, Ethyl (B): <10 mg/dL (ref <10)

## 2022-08-18 MED ORDER — LORAZEPAM 1 MG PO TABS
0.0000 mg | ORAL_TABLET | Freq: Four times a day (QID) | ORAL | Status: DC
  Administered 2022-08-18: 1 mg via ORAL
  Filled 2022-08-18: qty 1

## 2022-08-18 MED ORDER — NAPROXEN 500 MG PO TABS
500.0000 mg | ORAL_TABLET | Freq: Two times a day (BID) | ORAL | Status: DC
  Administered 2022-08-18 – 2022-08-19 (×3): 500 mg via ORAL
  Filled 2022-08-18 (×3): qty 1

## 2022-08-18 MED ORDER — TRAZODONE HCL 100 MG PO TABS
50.0000 mg | ORAL_TABLET | Freq: Every evening | ORAL | Status: DC | PRN
  Filled 2022-08-18: qty 1

## 2022-08-18 MED ORDER — GABAPENTIN 300 MG PO CAPS
300.0000 mg | ORAL_CAPSULE | Freq: Three times a day (TID) | ORAL | Status: DC
  Administered 2022-08-18 – 2022-08-19 (×3): 300 mg via ORAL
  Filled 2022-08-18 (×4): qty 1

## 2022-08-18 MED ORDER — LORAZEPAM 2 MG/ML IJ SOLN: 0.0000 mg | Freq: Four times a day (QID) | INTRAMUSCULAR | Status: DC

## 2022-08-18 MED ORDER — SERTRALINE HCL 50 MG PO TABS
50.0000 mg | ORAL_TABLET | Freq: Every day | ORAL | Status: DC
  Administered 2022-08-18 – 2022-08-19 (×2): 50 mg via ORAL
  Filled 2022-08-18 (×2): qty 1

## 2022-08-18 MED ORDER — THIAMINE MONONITRATE 100 MG PO TABS
100.0000 mg | ORAL_TABLET | Freq: Every day | ORAL | Status: DC
  Administered 2022-08-18 – 2022-08-19 (×2): 100 mg via ORAL
  Filled 2022-08-18 (×2): qty 1

## 2022-08-18 MED ORDER — LORAZEPAM 2 MG/ML IJ SOLN: 0.0000 mg | Freq: Two times a day (BID) | INTRAMUSCULAR | Status: DC

## 2022-08-18 MED ORDER — LORAZEPAM 1 MG PO TABS: 0.0000 mg | ORAL_TABLET | Freq: Two times a day (BID) | ORAL | Status: DC

## 2022-08-18 NOTE — Progress Notes (Signed)
Pt denied at [facility] due to high medical acuity.  Benjaman Kindler, MSW, [facility] 08/18/2022 10:06 PM
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
## 2022-08-18 NOTE — ED Triage Notes (Signed)
Pt presents with c/o alcohol detox. Pt reports her last drink was over 24 hours ago because she has been around the department and in the lobby. Pt denies SI.

## 2022-08-19 MED ORDER — ACETAMINOPHEN 500 MG PO TABS
1000.0000 mg | ORAL_TABLET | Freq: Once | ORAL | Status: AC
Start: 2022-08-19 — End: 2022-08-19
  Administered 2022-08-19: 1000 mg via ORAL
  Filled 2022-08-19: qty 2
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
## 2022-08-19 NOTE — BH Assessment (Addendum)
[place] Assessment Progress Note   Per Lindon Romp, NP, this voluntary pt does not require psychiatric hospitalization at this time.  Pt is psychiatrically cleared.  Discharge instructions include referral information to [facility].  A TOC consult has been ordered to address pt's psychosocial needs.  EDP Teressa Lower, MD, Ulysees Barns, LCSW, and pt's nurse, Bobbye Charleston, have been notified.  Jalene Mullet, [place] Triage Specialist [phone]
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
## 2022-08-19 NOTE — Discharge Instructions (Addendum)
For your behavioral health needs you are advised to follow up with [facility] at your earliest opportunity:      [facility]      [address], [place], [place] [address]      [phone]      They offer psychiatry/medication management and therapy.  New patients are seen in their walk-in clinic.  Walk-in hours are Monday, Wednesday, Thursday and Friday from 8:00 am - 11:00 am for psychiatry, and Monday and Wednesday from 8:00 am - 11:00 am for therapy.  Walk-in patients are seen on a first come, first served basis, so try to arrive as early as possible for the best chance of being seen the same day.  BE SURE TO TAKE THE ELEVATOR TO THE SECOND FLOOR.  Please note that to be eligible for services you must bring an ID or a piece of mail with your name and a [facility] address.

## 2022-08-19 NOTE — ED Provider Notes (Signed)
Emergency Medicine Observation Re-evaluation Note  Natalie Mcdaniel is a 56 y.o. female, seen on rounds today.  Pt initially presented to the ED for complaints of Alcohol Problem Currently, the patient is resting comfortably.  Physical Exam  BP 116/82 (BP Location: Right Arm)   Pulse 61   Temp 97.6 F (36.4 C) (Oral)   Resp 16   LMP 08/31/2013   SpO2 100%  Physical Exam Vitals and nursing note reviewed.  Constitutional:      General: She is not in acute distress.    Appearance: She is well-developed.  HENT:     Head: Normocephalic and atraumatic.  Eyes:     Conjunctiva/sclera: Conjunctivae normal.  Cardiovascular:     Rate and Rhythm: Normal rate and regular rhythm.     Heart sounds: No murmur heard. Pulmonary:     Effort: Pulmonary effort is normal. No respiratory distress.  Musculoskeletal:        General: No swelling.     Cervical back: Neck supple.  Skin:    General: Skin is warm and dry.     Capillary Refill: Capillary refill takes less than 2 seconds.  Neurological:     Mental Status: She is alert.  Psychiatric:        Mood and Affect: Mood normal.      ED Course / MDM  EKG:   I have reviewed the labs performed to date as well as medications administered while in observation.  Recent changes in the last 24 hours include TTS evaluation with psychiatric clearance and recommendations for discharge with outpatient substance abuse counseling.  Patient has no active evidence of withdrawal here in the emergency department and will be discharged via psychiatric recommendations.  Plan  Current plan is for discharge.    Teressa Lower, MD 08/19/22 [phone]

## 2022-08-22 ENCOUNTER — Other Ambulatory Visit (HOSPITAL_COMMUNITY): Payer: Self-pay

## 2022-09-10 ENCOUNTER — Encounter: Payer: Self-pay | Admitting: Critical Care Medicine

## 2022-09-11 NOTE — Progress Notes (Signed)
This is a 56 year old female with severe alcoholism with complications including Warnicke's Korsakoff encephalopathy with B1 deficiency toxic metabolic encephalopathy in the past severe neuropathy major depression and homelessness.  Patient is also status post gastric bypass likely not absorbing some of the vitamins.  I was asked by the shelter director to look at the patient and potentially sign an FL 2.  They are trying to work with [facility] to get this patient care.  The patient just arrived at the shelter today.  She had been living in the parking lot at [facility].  On exam the patient clearly is encephalopathic cannot remember where she has been or how she got here though apparently she drove over here she does have a car.  I told the shelter director and willing to sign an FL 2 she can obtain a blank from the [facility] group  She was seen at the patient care center last visit was April 2022.  It is possible we can get her back to the patient care center to reestablish or another clinic.Marland Kitchen

## 2022-09-16 ENCOUNTER — Telehealth: Payer: Self-pay | Admitting: Critical Care Medicine

## 2022-09-16 NOTE — Telephone Encounter (Signed)
Yes I will

## 2022-09-16 NOTE — Telephone Encounter (Signed)
Natalie Mcdaniel   Can you produce an FL-2 for me on this patient I have seen at [facility]?  I will fill out most of the form  TY  Dr Joya Gaskins

## 2022-11-24 ENCOUNTER — Encounter (HOSPITAL_COMMUNITY): Payer: Self-pay

## 2022-11-24 ENCOUNTER — Emergency Department (HOSPITAL_COMMUNITY): Payer: Medicaid Other

## 2022-11-24 ENCOUNTER — Other Ambulatory Visit: Payer: Self-pay

## 2022-11-24 ENCOUNTER — Emergency Department (HOSPITAL_COMMUNITY)
Admission: EM | Admit: 2022-11-24 | Discharge: 2022-11-25 | Disposition: A | Discharge status: Home / Self Care | Payer: Medicaid Other | Attending: Emergency Medicine | Admitting: Emergency Medicine

## 2022-11-24 DIAGNOSIS — R519 Headache, unspecified: Secondary | ICD-10-CM | POA: Diagnosis present

## 2022-11-24 DIAGNOSIS — H3561 Retinal hemorrhage, right eye: Secondary | ICD-10-CM | POA: Insufficient documentation

## 2022-11-24 LAB — TROPONIN I (HIGH SENSITIVITY): Troponin I (High Sensitivity): 2 ng/L (ref <18)

## 2022-11-24 LAB — CBC WITH DIFFERENTIAL/PLATELET
Abs Immature Granulocytes: 0.02 10*3/uL (ref 0.00–0.07)
Basophils Absolute: 0 10*3/uL (ref 0.0–0.1)
Basophils Relative: 0 %
Eosinophils Absolute: 0.1 10*3/uL (ref 0.0–0.5)
Eosinophils Relative: 2 %
HCT: 38.3 % (ref 36.0–46.0)
Hemoglobin: 12.4 g/dL (ref 12.0–15.0)
Immature Granulocytes: 0 %
Lymphocytes Relative: 29 %
Lymphs Abs: 1.7 10*3/uL (ref 0.7–4.0)
MCH: 30.8 pg (ref 26.0–34.0)
MCHC: 32.4 g/dL (ref 30.0–36.0)
MCV: 95 fL (ref 80.0–100.0)
Monocytes Absolute: 0.5 10*3/uL (ref 0.1–1.0)
Monocytes Relative: 8 %
Neutro Abs: 3.5 10*3/uL (ref 1.7–7.7)
Neutrophils Relative %: 61 %
Platelets: 154 10*3/uL (ref 150–400)
RBC: 4.03 MIL/uL (ref 3.87–5.11)
RDW: 17.3 % — ABNORMAL HIGH (ref 11.5–15.5)
WBC: 6 10*3/uL (ref 4.0–10.5)
nRBC: 0 % (ref 0.0–0.2)

## 2022-11-24 LAB — BASIC METABOLIC PANEL
Anion gap: 9 (ref 5–15)
BUN: 8 mg/dL (ref 6–20)
CO2: 23 mmol/L (ref 22–32)
Calcium: 9.1 mg/dL (ref 8.9–10.3)
Chloride: 105 mmol/L (ref 98–111)
Creatinine, Ser: 0.73 mg/dL (ref 0.44–1.00)
GFR, Estimated: >60 mL/min (ref >60)
Glucose, Bld: 102 mg/dL — ABNORMAL HIGH (ref 70–99)
Potassium: 3.7 mmol/L (ref 3.5–5.1)
Sodium: 137 mmol/L (ref 135–145)

## 2022-11-24 LAB — MAGNESIUM: Magnesium: 2 mg/dL (ref 1.7–2.4)
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
## 2022-11-24 NOTE — ED Notes (Signed)
Pt taken to radiology
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
## 2022-11-26 ENCOUNTER — Other Ambulatory Visit: Payer: Self-pay | Admitting: Critical Care Medicine

## 2022-12-04 ENCOUNTER — Telehealth: Payer: Self-pay

## 2022-12-04 NOTE — Telephone Encounter (Signed)
Completed FL2 sent to Tomi Bamberger Taylor/ case manager- [facility] via secure email.  She is trying to assist the patient with obtaining housing

## 2023-05-28 ENCOUNTER — Other Ambulatory Visit: Payer: Self-pay

## 2023-05-28 ENCOUNTER — Encounter (HOSPITAL_BASED_OUTPATIENT_CLINIC_OR_DEPARTMENT_OTHER): Payer: Self-pay | Admitting: Emergency Medicine

## 2023-05-28 ENCOUNTER — Emergency Department (HOSPITAL_BASED_OUTPATIENT_CLINIC_OR_DEPARTMENT_OTHER)
Admission: EM | Admit: 2023-05-28 | Discharge: 2023-05-28 | Disposition: A | Discharge status: Home / Self Care | Payer: 59 | Attending: Emergency Medicine | Admitting: Emergency Medicine

## 2023-05-28 DIAGNOSIS — F10120 Alcohol abuse with intoxication, uncomplicated: Secondary | ICD-10-CM | POA: Insufficient documentation

## 2023-05-28 DIAGNOSIS — R630 Anorexia: Secondary | ICD-10-CM | POA: Diagnosis not present

## 2023-05-28 DIAGNOSIS — M79671 Pain in right foot: Secondary | ICD-10-CM | POA: Diagnosis not present

## 2023-05-28 DIAGNOSIS — Z5900 Homelessness unspecified: Secondary | ICD-10-CM | POA: Diagnosis not present

## 2023-05-28 DIAGNOSIS — Y907 Blood alcohol level of 200-239 mg/100 ml: Secondary | ICD-10-CM | POA: Diagnosis not present

## 2023-05-28 DIAGNOSIS — F1090 Alcohol use, unspecified, uncomplicated: Secondary | ICD-10-CM | POA: Diagnosis not present

## 2023-05-28 DIAGNOSIS — F10920 Alcohol use, unspecified with intoxication, uncomplicated: Secondary | ICD-10-CM

## 2023-05-28 DIAGNOSIS — M79672 Pain in left foot: Secondary | ICD-10-CM | POA: Insufficient documentation

## 2023-05-28 DIAGNOSIS — F10929 Alcohol use, unspecified with intoxication, unspecified: Secondary | ICD-10-CM | POA: Diagnosis not present

## 2023-05-28 DIAGNOSIS — I10 Essential (primary) hypertension: Secondary | ICD-10-CM | POA: Diagnosis not present

## 2023-05-28 DIAGNOSIS — E860 Dehydration: Secondary | ICD-10-CM | POA: Diagnosis not present

## 2023-05-28 DIAGNOSIS — Z743 Need for continuous supervision: Secondary | ICD-10-CM | POA: Diagnosis not present

## 2023-05-28 DIAGNOSIS — F10129 Alcohol abuse with intoxication, unspecified: Secondary | ICD-10-CM | POA: Diagnosis not present

## 2023-05-28 LAB — CBC WITH DIFFERENTIAL/PLATELET
Abs Immature Granulocytes: 0 10*3/uL (ref 0.00–0.07)
Basophils Absolute: 0 10*3/uL (ref 0.0–0.1)
Basophils Relative: 1 %
Eosinophils Absolute: 0.1 10*3/uL (ref 0.0–0.5)
Eosinophils Relative: 2 %
HCT: 42.2 % (ref 36.0–46.0)
Hemoglobin: 14.1 g/dL (ref 12.0–15.0)
Immature Granulocytes: 0 %
Lymphocytes Relative: 38 %
Lymphs Abs: 1.9 10*3/uL (ref 0.7–4.0)
MCH: 30.5 pg (ref 26.0–34.0)
MCHC: 33.4 g/dL (ref 30.0–36.0)
MCV: 91.3 fL (ref 80.0–100.0)
Monocytes Absolute: 0.3 10*3/uL (ref 0.1–1.0)
Monocytes Relative: 7 %
Neutro Abs: 2.6 10*3/uL (ref 1.7–7.7)
Neutrophils Relative %: 52 %
Platelets: 302 10*3/uL (ref 150–400)
RBC: 4.62 MIL/uL (ref 3.87–5.11)
RDW: 16.6 % — ABNORMAL HIGH (ref 11.5–15.5)
WBC: 4.9 10*3/uL (ref 4.0–10.5)
nRBC: 0 % (ref 0.0–0.2)

## 2023-05-28 LAB — BASIC METABOLIC PANEL
Anion gap: 10 (ref 5–15)
BUN: 7 mg/dL (ref 6–20)
CO2: 30 mmol/L (ref 22–32)
Calcium: 9.1 mg/dL (ref 8.9–10.3)
Chloride: 98 mmol/L (ref 98–111)
Creatinine, Ser: 0.87 mg/dL (ref 0.44–1.00)
GFR, Estimated: >60 mL/min (ref >60)
Glucose, Bld: 96 mg/dL (ref 70–99)
Potassium: 4.1 mmol/L (ref 3.5–5.1)
Sodium: 138 mmol/L (ref 135–145)

## 2023-05-28 LAB — ETHANOL: Alcohol, Ethyl (B): 235 mg/dL — ABNORMAL HIGH (ref <10)

## 2023-05-28 MED ORDER — CHLORDIAZEPOXIDE HCL 25 MG PO CAPS
ORAL_CAPSULE | ORAL | 0 refills | Status: AC
  Filled 2023-05-28: qty 10, 2d supply, fill #0

## 2023-05-28 NOTE — ED Notes (Signed)
RN reviewed discharge instructions with pt. Pt verbalized understanding and had no further questions. VSS upon discharge.  

## 2023-05-28 NOTE — ED Notes (Signed)
Pt assisted to and from bathroom via wheelchair. Reports she normally uses walker at home to get around

## 2023-05-28 NOTE — ED Notes (Signed)
ED Provider at bedside. 

## 2023-05-28 NOTE — Discharge Instructions (Signed)
You were seen in the emergency department for alcohol intoxication. Your labs were normal but BAL was elevated. I have prescribed Librium to aid in alcohol withdrawal as you work into a detox program. Please seek help for the persistent alcohol intoxication as this is causing long-term damage to your health and well-being.

## 2023-05-28 NOTE — ED Provider Notes (Signed)
[place] EMERGENCY DEPARTMENT AT [facility] Provider Note   CSN: [id number] Arrival date & time: 05/28/23  1755     History Chief Complaint  Patient presents with   Alcohol Problem    Natalie Mcdaniel is a 57 y.o. female. Patient presents to the ED with concerns of alcohol intoxication. She reports that she drank heavily this morning and typically drinks heavily most days. Denies any nausea, vomiting, sweating, tremulousness, or any other signs of withdrawal. Patient endorses currently being homeless. Patient endorses some decreased PO intake lately due to food insecurity and not by choice. She expresses some desire to stop drinking.  Patient also endorsing some pain in bilateral feet but states she is taking gabapentin for this with some improvement. Known history of neuropathy in feet.    Alcohol Problem Pertinent negatives include no abdominal pain.       Home Medications Prior to Admission medications   Medication Sig Start Date End Date Taking? Authorizing Provider  chlordiazePOXIDE (LIBRIUM) 25 MG capsule 50mg  PO TID x 1D, then 25-50mg  PO BID X 1D, then 25-50mg  PO QD X 1D 05/28/23  Yes Tripp Goins A, PA-C  gabapentin (NEURONTIN) 300 MG capsule Take 1 capsule (300 mg total) by mouth 3 (three) times daily. 08/04/22 09/03/22  Park Pope, MD  Multiple Vitamin (MULTIVITAMIN WITH MINERALS) TABS tablet Take 1 tablet by mouth daily. 08/05/22   Park Pope, MD  sertraline (ZOLOFT) 50 MG tablet Take 1 tablet (50 mg total) by mouth daily. 08/05/22 09/04/22  Park Pope, MD  thiamine (VITAMIN B1) 100 MG tablet Take 1 tablet (100 mg total) by mouth daily. 08/05/22   Park Pope, MD  traZODone (DESYREL) 50 MG tablet Take 1 tablet (50 mg total) by mouth at bedtime as needed for sleep. 08/04/22   Park Pope, MD      Allergies    Bee venom and Wasp venom    Review of Systems   Review of Systems  Gastrointestinal:  Negative for abdominal pain, nausea and vomiting.  All other systems  reviewed and are negative.   Physical Exam Updated Vital Signs BP 104/61   Pulse 84   Temp 98.2 F (36.8 C) (Oral)   Resp 18   Ht 5\' 6"  (1.676 m)   Wt 88 kg   LMP 08/31/2013   SpO2 93%   BMI 31.31 kg/m  Physical Exam Vitals and nursing note reviewed.  Constitutional:      General: She is not in acute distress.    Appearance: She is well-developed.  HENT:     Head: Normocephalic and atraumatic.  Eyes:     Conjunctiva/sclera: Conjunctivae normal.  Cardiovascular:     Rate and Rhythm: Normal rate and regular rhythm.     Heart sounds: No murmur heard. Pulmonary:     Effort: Pulmonary effort is normal. No respiratory distress.     Breath sounds: Normal breath sounds.  Abdominal:     General: Abdomen is flat.     Palpations: Abdomen is soft.     Tenderness: There is no abdominal tenderness.  Musculoskeletal:        General: No swelling.     Cervical back: Neck supple.  Skin:    General: Skin is warm and dry.     Capillary Refill: Capillary refill takes less than 2 seconds.  Neurological:     General: No focal deficit present.     Mental Status: She is alert and oriented to person, place, and time. Mental status  is at baseline.     Cranial Nerves: No cranial nerve deficit.     Motor: No weakness.     Comments: Gait slightly unsteady likely due to intoxication but patient able to ambulate without assistance  Psychiatric:        Mood and Affect: Mood normal.     ED Results / Procedures / Treatments   Labs (all labs ordered are listed, but only abnormal results are displayed) Labs Reviewed  CBC WITH DIFFERENTIAL/PLATELET - Abnormal; Notable for the following components:      Result Value   RDW 16.6 (*)    All other components within normal limits  ETHANOL - Abnormal; Notable for the following components:   Alcohol, Ethyl (B) 235 (*)    All other components within normal limits  BASIC METABOLIC PANEL    EKG None  Radiology No results  found.  Procedures Procedures   Medications Ordered in ED Medications - No data to display  ED Course/ Medical Decision Making/ A&P                           Medical Decision Making Amount and/or Complexity of Data Reviewed Labs: ordered.   This patient presents to the ED for concern of alcohol problem.  Differential diagnosis includes acute alcohol withdrawal, delirium tremens, alcohol intoxication, gastroenteritis, nausea vomiting   Lab Tests:  I Ordered, and personally interpreted labs.  The pertinent results include: CBC and BMP unremarkable, ethanol elevated at 235   Problem List / ED Course:  Patient presented to the ED for alcohol use concerns. She endorses being homeless at this time. States that she typically drinks heavily on most days but is looking to detox. Ordered basic labs and BAL for evaluation. Labs unremarkable but BAL elevated consistent with acute intoxication. When discussing alcohol use with patient on reevaluation, she become somewhat agitated. Still no evidence of acute withdrawal. Will plan on discharging patient with resource guide and prescription for Librium to aid with withdrawal symptoms. Patient frustrated that she is being discharged as she was anticipating sleeping in the room for the night. No other plans for any interventions at this point and will discharge patient with resources and prescription available.  Patient is able to hold a conversation with me and able to ambulate without significant difficulty.  I believe the patient is functionally stable for discharge at this time.   Social Determinants of Health:  Chronic alcohol abuse  Final Clinical Impression(s) / ED Diagnoses Final diagnoses:  Alcoholic intoxication without complication (HCC)    Rx / DC Orders ED Discharge Orders          Ordered    chlordiazePOXIDE (LIBRIUM) 25 MG capsule        05/28/23 2309              Smitty Knudsen, PA-C 05/28/23 2345    Arby Barrette, MD 05/29/23 1753

## 2023-05-28 NOTE — ED Triage Notes (Signed)
Pt arrives to ED with [facility] who repost c/o ETOH problem. Pt notes that she "wakes up and doesn't know what day or time it is."

## 2023-05-29 ENCOUNTER — Other Ambulatory Visit (HOSPITAL_COMMUNITY): Payer: Self-pay

## 2023-06-08 ENCOUNTER — Other Ambulatory Visit (HOSPITAL_COMMUNITY): Payer: Self-pay

## 2023-06-16 ENCOUNTER — Emergency Department (HOSPITAL_BASED_OUTPATIENT_CLINIC_OR_DEPARTMENT_OTHER)
Admission: EM | Admit: 2023-06-16 | Discharge: 2023-06-16 | Disposition: A | Discharge status: Home / Self Care | Payer: 59 | Attending: Emergency Medicine | Admitting: Emergency Medicine

## 2023-06-16 ENCOUNTER — Encounter (HOSPITAL_BASED_OUTPATIENT_CLINIC_OR_DEPARTMENT_OTHER): Payer: Self-pay

## 2023-06-16 DIAGNOSIS — Z207 Contact with and (suspected) exposure to pediculosis, acariasis and other infestations: Secondary | ICD-10-CM | POA: Diagnosis not present

## 2023-06-16 DIAGNOSIS — Z743 Need for continuous supervision: Secondary | ICD-10-CM | POA: Diagnosis not present

## 2023-06-16 DIAGNOSIS — B888 Other specified infestations: Secondary | ICD-10-CM | POA: Diagnosis not present

## 2023-06-16 DIAGNOSIS — R509 Fever, unspecified: Secondary | ICD-10-CM | POA: Diagnosis not present

## 2023-06-16 DIAGNOSIS — R2233 Localized swelling, mass and lump, upper limb, bilateral: Secondary | ICD-10-CM | POA: Diagnosis not present

## 2023-06-16 DIAGNOSIS — F10929 Alcohol use, unspecified with intoxication, unspecified: Secondary | ICD-10-CM | POA: Diagnosis not present

## 2023-06-16 MED ORDER — LORATADINE 10 MG PO TABS
10.0000 mg | ORAL_TABLET | Freq: Once | ORAL | Status: AC
Start: 2023-06-16 — End: 2023-06-16
  Administered 2023-06-16: 10 mg via ORAL
  Filled 2023-06-16: qty 1

## 2023-06-16 NOTE — ED Provider Notes (Signed)
[place] EMERGENCY DEPARTMENT AT [facility]  Provider Note  CSN: [id number] Arrival date & time: 06/16/23 0108  History Chief Complaint  Patient presents with   Leg Pain    Natalie Mcdaniel is a 57 y.o. female with history of chronic alcohol use, neuropathy and homelessness living out of her car brought to the ED via EMS from the [facility] parking lot for evaluation of bed bugs. She reports she is itching on her arms and legs. She reports beg bugs have infested her vehicle. Otherwise no acute complaints.    Home Medications Prior to Admission medications   Medication Sig Start Date End Date Taking? Authorizing Provider  gabapentin (NEURONTIN) 300 MG capsule Take 1 capsule (300 mg total) by mouth 3 (three) times daily. 08/04/22 09/03/22  Park Pope, MD  Multiple Vitamin (MULTIVITAMIN WITH MINERALS) TABS tablet Take 1 tablet by mouth daily. 08/05/22   Park Pope, MD  sertraline (ZOLOFT) 50 MG tablet Take 1 tablet (50 mg total) by mouth daily. 08/05/22 09/04/22  Park Pope, MD  thiamine (VITAMIN B1) 100 MG tablet Take 1 tablet (100 mg total) by mouth daily. 08/05/22   Park Pope, MD  traZODone (DESYREL) 50 MG tablet Take 1 tablet (50 mg total) by mouth at bedtime as needed for sleep. 08/04/22   Park Pope, MD     Allergies    Bee venom and Wasp venom   Review of Systems   Review of Systems Please see HPI for pertinent positives and negatives  Physical Exam BP 122/84 (BP Location: Right Arm)   Pulse 83   Temp 98.7 F (37.1 C) (Oral)   Resp 18   Ht 5\' 6"  (1.676 m)   Wt 87 kg   LMP 08/31/2013   SpO2 99%   BMI 30.96 kg/m   Physical Exam Vitals and nursing note reviewed.  HENT:     Head: Normocephalic.     Nose: Nose normal.  Eyes:     Extraocular Movements: Extraocular movements intact.  Pulmonary:     Effort: Pulmonary effort is normal.  Musculoskeletal:        General: Normal range of motion.     Cervical back: Neck supple.  Skin:    Findings: No rash (on  exposed skin).     Comments: Occasional insect bites on arms  Neurological:     Mental Status: She is alert and oriented to person, place, and time.  Psychiatric:        Mood and Affect: Mood normal.     ED Results / Procedures / Treatments   EKG None  Procedures Procedures  Medications Ordered in the ED Medications  loratadine (CLARITIN) tablet 10 mg (has no administration in time range)    Initial Impression and Plan  Patient advised that the only solution to beg bug infestation is extermination. There is no medication to eradicate them. Recommend topical itch creams and antihistamines for symptom relief. Recommend she wash all of her clothing and bed linens in hot water and call an exterminator service to discuss treatment of her vehicle. She was advised that re-infestation is common.   ED Course       MDM Rules/Calculators/A&P Medical Decision Making Problems Addressed: Infestation by bed bug: acute illness or injury  Risk OTC drugs.     Final Clinical Impression(s) / ED Diagnoses Final diagnoses:  Infestation by bed bug    Rx / DC Orders ED Discharge Orders     None  Pollyann Savoy, MD 06/16/23 0157
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
## 2023-07-13 ENCOUNTER — Emergency Department (HOSPITAL_BASED_OUTPATIENT_CLINIC_OR_DEPARTMENT_OTHER)
Admission: EM | Admit: 2023-07-13 | Discharge: 2023-07-13 | Disposition: A | Discharge status: Home / Self Care | Payer: MEDICAID | Attending: Emergency Medicine | Admitting: Emergency Medicine

## 2023-07-13 ENCOUNTER — Other Ambulatory Visit: Payer: Self-pay

## 2023-07-13 ENCOUNTER — Encounter (HOSPITAL_BASED_OUTPATIENT_CLINIC_OR_DEPARTMENT_OTHER): Payer: Self-pay | Admitting: *Deleted

## 2023-07-13 DIAGNOSIS — G629 Polyneuropathy, unspecified: Secondary | ICD-10-CM

## 2023-07-13 DIAGNOSIS — R413 Other amnesia: Secondary | ICD-10-CM | POA: Diagnosis not present

## 2023-07-13 DIAGNOSIS — G6289 Other specified polyneuropathies: Secondary | ICD-10-CM | POA: Diagnosis not present

## 2023-07-13 DIAGNOSIS — Z5900 Homelessness unspecified: Secondary | ICD-10-CM | POA: Diagnosis not present

## 2023-07-13 NOTE — ED Provider Notes (Signed)
[facility]atalie Mcdaniel EMERGENCY DEPARTMENT AT [facility]  Provider Note  CSN: [id number] Arrival date & time: 07/13/23 0022  History Chief Complaint  Patient presents with   Peripheral Neuropathy    Natalie Mcdaniel is a 57 y.o. female with history of alcohol use disorder, homelessness, living in her car at the [facility] parking lot and chronic peripheral neuropathy originally called EMS earlier tonight requesting evaluation of her neuropathy. After waiting for an exam room, she now reports to me she is 'not feeling well' and has trouble remembering things. She has no acute complaints. She has been seen by PCP and the ED for similar in the past. She has Rx for memantine and gabapentin from [facility] medical center, but takes them both inconsistently.    Home Medications Prior to Admission medications   Medication Sig Start Date End Date Taking? Authorizing Provider  gabapentin (NEURONTIN) 300 MG capsule Take 1 capsule (300 mg total) by mouth 3 (three) times daily. 08/04/22 09/03/22  Park Pope, MD  Multiple Vitamin (MULTIVITAMIN WITH MINERALS) TABS tablet Take 1 tablet by mouth daily. 08/05/22   Park Pope, MD  sertraline (ZOLOFT) 50 MG tablet Take 1 tablet (50 mg total) by mouth daily. 08/05/22 09/04/22  Park Pope, MD  thiamine (VITAMIN B1) 100 MG tablet Take 1 tablet (100 mg total) by mouth daily. 08/05/22   Park Pope, MD  traZODone (DESYREL) 50 MG tablet Take 1 tablet (50 mg total) by mouth at bedtime as needed for sleep. 08/04/22   Park Pope, MD     Allergies    Bee venom and Wasp venom   Review of Systems   Review of Systems Please see HPI for pertinent positives and negatives  Physical Exam BP 137/80 (BP Location: Left Arm)   Pulse 87   Temp 98.2 F (36.8 C)   Resp 16   LMP 08/31/2013   SpO2 100%   Physical Exam Vitals and nursing note reviewed.  HENT:     Head: Normocephalic.     Nose: Nose normal.  Eyes:     Extraocular Movements: Extraocular movements intact.   Pulmonary:     Effort: Pulmonary effort is normal.  Musculoskeletal:        General: Normal range of motion.     Cervical back: Neck supple.  Skin:    Findings: No rash (on exposed skin).  Neurological:     Mental Status: She is alert and oriented to person, place, and time.  Psychiatric:        Mood and Affect: Mood normal.     ED Results / Procedures / Treatments   EKG None  Procedures Procedures  Medications Ordered in the ED Medications - No data to display  Initial Impression and Plan  Patient here initially for evaluation of her chronic neuropathy also now reports concerns regarding ongoing memory problems. She was advised that the ED cannot help manage either of those issues for her and she was encouraged to follow up with her PCP as listed on her pill bottles for re-evaluation.   ED Course       MDM Rules/Calculators/A&P Medical Decision Making Problems Addressed: Memory difficulty: chronic illness or injury Peripheral polyneuropathy: chronic illness or injury  Risk Prescription drug management.     Final Clinical Impression(s) / ED Diagnoses Final diagnoses:  Peripheral polyneuropathy  Memory difficulty    Rx / DC Orders ED Discharge Orders     None        Pollyann Savoy, MD  07/13/23 0449  

## 2023-07-13 NOTE — ED Notes (Signed)
NT found a bug on bed with patient. Patient is known to have bed bugs, lives in her car in [place] parking lot on [address]. [employer] declined a courtesy trip for her back to [employer].

## 2023-07-13 NOTE — ED Triage Notes (Signed)
Pt reports neuropathic pain in bilateral lower legs that is usually controlled with gabapentin. Reports diet has been worse due to living in her car. Pt took her 3rd dose of gabapentin while in triage.

## 2023-09-04 ENCOUNTER — Telehealth (HOSPITAL_BASED_OUTPATIENT_CLINIC_OR_DEPARTMENT_OTHER): Payer: Self-pay

## 2023-09-14 ENCOUNTER — Other Ambulatory Visit: Payer: Self-pay

## 2023-09-14 ENCOUNTER — Emergency Department (HOSPITAL_COMMUNITY)
Admission: EM | Admit: 2023-09-14 | Discharge: 2023-09-14 | Payer: BLUE CROSS/BLUE SHIELD | Attending: Emergency Medicine | Admitting: Emergency Medicine

## 2023-09-14 DIAGNOSIS — Y908 Blood alcohol level of 240 mg/100 ml or more: Secondary | ICD-10-CM | POA: Insufficient documentation

## 2023-09-14 DIAGNOSIS — F10120 Alcohol abuse with intoxication, uncomplicated: Secondary | ICD-10-CM | POA: Diagnosis present

## 2023-09-14 DIAGNOSIS — F10920 Alcohol use, unspecified with intoxication, uncomplicated: Secondary | ICD-10-CM

## 2023-09-14 LAB — COMPREHENSIVE METABOLIC PANEL
ALT: 15 U/L (ref 0–44)
AST: 30 U/L (ref 15–41)
Albumin: 4.2 g/dL (ref 3.5–5.0)
Alkaline Phosphatase: 128 U/L — ABNORMAL HIGH (ref 38–126)
Anion gap: 12 (ref 5–15)
BUN: 6 mg/dL (ref 6–20)
CO2: 26 mmol/L (ref 22–32)
Calcium: 8.7 mg/dL — ABNORMAL LOW (ref 8.9–10.3)
Chloride: 101 mmol/L (ref 98–111)
Creatinine, Ser: 0.75 mg/dL (ref 0.44–1.00)
GFR, Estimated: >60 mL/min (ref >60)
Glucose, Bld: 99 mg/dL (ref 70–99)
Potassium: 3.6 mmol/L (ref 3.5–5.1)
Sodium: 139 mmol/L (ref 135–145)
Total Bilirubin: 0.6 mg/dL (ref 0.3–1.2)
Total Protein: 8.9 g/dL — ABNORMAL HIGH (ref 6.5–8.1)

## 2023-09-14 LAB — CBC
HCT: 38.7 % (ref 36.0–46.0)
Hemoglobin: 12.7 g/dL (ref 12.0–15.0)
MCH: 29.4 pg (ref 26.0–34.0)
MCHC: 32.8 g/dL (ref 30.0–36.0)
MCV: 89.6 fL (ref 80.0–100.0)
Platelets: 264 10*3/uL (ref 150–400)
RBC: 4.32 MIL/uL (ref 3.87–5.11)
RDW: 17 % — ABNORMAL HIGH (ref 11.5–15.5)
WBC: 5.7 10*3/uL (ref 4.0–10.5)
nRBC: 0 % (ref 0.0–0.2)

## 2023-09-14 LAB — ETHANOL: Alcohol, Ethyl (B): 371 mg/dL (ref <10)
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
## 2023-10-31 ENCOUNTER — Other Ambulatory Visit: Payer: Self-pay

## 2023-10-31 ENCOUNTER — Encounter (HOSPITAL_COMMUNITY): Payer: Self-pay | Admitting: Emergency Medicine

## 2023-10-31 ENCOUNTER — Emergency Department (HOSPITAL_COMMUNITY)
Admission: EM | Admit: 2023-10-31 | Discharge: 2023-10-31 | Disposition: A | Discharge status: Home / Self Care | Payer: BLUE CROSS/BLUE SHIELD | Attending: Emergency Medicine | Admitting: Emergency Medicine

## 2023-10-31 DIAGNOSIS — Y908 Blood alcohol level of 240 mg/100 ml or more: Secondary | ICD-10-CM | POA: Diagnosis not present

## 2023-10-31 DIAGNOSIS — R45851 Suicidal ideations: Secondary | ICD-10-CM | POA: Diagnosis not present

## 2023-10-31 DIAGNOSIS — F10120 Alcohol abuse with intoxication, uncomplicated: Secondary | ICD-10-CM | POA: Insufficient documentation

## 2023-10-31 DIAGNOSIS — F10920 Alcohol use, unspecified with intoxication, uncomplicated: Secondary | ICD-10-CM

## 2023-10-31 LAB — CBC
HCT: 42.3 % (ref 36.0–46.0)
Hemoglobin: 13.5 g/dL (ref 12.0–15.0)
MCH: 29.7 pg (ref 26.0–34.0)
MCHC: 31.9 g/dL (ref 30.0–36.0)
MCV: 93.2 fL (ref 80.0–100.0)
Platelets: 325 10*3/uL (ref 150–400)
RBC: 4.54 MIL/uL (ref 3.87–5.11)
RDW: 16.6 % — ABNORMAL HIGH (ref 11.5–15.5)
WBC: 10.5 10*3/uL (ref 4.0–10.5)
nRBC: 0 % (ref 0.0–0.2)

## 2023-10-31 LAB — BASIC METABOLIC PANEL
Anion gap: 12 (ref 5–15)
BUN: <5 mg/dL — ABNORMAL LOW (ref 6–20)
CO2: 19 mmol/L — ABNORMAL LOW (ref 22–32)
Calcium: 8.6 mg/dL — ABNORMAL LOW (ref 8.9–10.3)
Chloride: 105 mmol/L (ref 98–111)
Creatinine, Ser: 0.53 mg/dL (ref 0.44–1.00)
GFR, Estimated: >60 mL/min (ref >60)
Glucose, Bld: 86 mg/dL (ref 70–99)
Potassium: 5.2 mmol/L — ABNORMAL HIGH (ref 3.5–5.1)
Sodium: 136 mmol/L (ref 135–145)

## 2023-10-31 LAB — ETHANOL: Alcohol, Ethyl (B): 394 mg/dL (ref <10)

## 2023-10-31 MED ORDER — LACTATED RINGERS IV BOLUS: 1000.0000 mL | Freq: Once | INTRAVENOUS | Status: DC

## 2023-10-31 MED ORDER — THIAMINE HCL 100 MG/ML IJ SOLN: 100.0000 mg | Freq: Once | INTRAMUSCULAR | Status: DC
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
## 2023-10-31 NOTE — ED Notes (Signed)
Pt did not use call bell but while this writer was walking past the room pt attempted to state that she needed help. Did not specify what she needed help with. This Clinical research associate was attempting to gather supplies for another patient and this pt decided to start urinating in the floor. Pt also was standing up holding onto the rail of the exam chair while NT was helping clean the chair off when pt decided to lay down in the floor.
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
## 2023-10-31 NOTE — ED Notes (Signed)
Pt assisted back up into exam chair. Pt educated on the importance of staying in chair and not getting up without assistance.

## 2023-10-31 NOTE — Discharge Instructions (Addendum)
Avoid drinking alcohol excessively.  Drink plenty of fluids.  These follow-up with outpatient treatment centers to get help with your alcohol use
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
## 2023-11-08 ENCOUNTER — Emergency Department (HOSPITAL_COMMUNITY): Payer: BLUE CROSS/BLUE SHIELD

## 2023-11-08 ENCOUNTER — Other Ambulatory Visit: Payer: Self-pay

## 2023-11-08 ENCOUNTER — Emergency Department (HOSPITAL_COMMUNITY)
Admission: EM | Admit: 2023-11-08 | Discharge: 2023-11-08 | Disposition: A | Discharge status: Home / Self Care | Payer: BLUE CROSS/BLUE SHIELD | Attending: Emergency Medicine | Admitting: Emergency Medicine

## 2023-11-08 ENCOUNTER — Encounter (HOSPITAL_COMMUNITY): Payer: Self-pay

## 2023-11-08 DIAGNOSIS — M25562 Pain in left knee: Secondary | ICD-10-CM | POA: Diagnosis not present

## 2023-11-08 DIAGNOSIS — M25572 Pain in left ankle and joints of left foot: Secondary | ICD-10-CM | POA: Insufficient documentation

## 2023-11-08 DIAGNOSIS — R519 Headache, unspecified: Secondary | ICD-10-CM | POA: Diagnosis present

## 2023-11-08 DIAGNOSIS — S0990XA Unspecified injury of head, initial encounter: Secondary | ICD-10-CM | POA: Diagnosis not present

## 2023-11-08 LAB — CBC WITH DIFFERENTIAL/PLATELET
Abs Immature Granulocytes: 0.02 10*3/uL (ref 0.00–0.07)
Basophils Absolute: 0.1 10*3/uL (ref 0.0–0.1)
Basophils Relative: 1 %
Eosinophils Absolute: 0.1 10*3/uL (ref 0.0–0.5)
Eosinophils Relative: 1 %
HCT: 39.1 % (ref 36.0–46.0)
Hemoglobin: 13.1 g/dL (ref 12.0–15.0)
Immature Granulocytes: 0 %
Lymphocytes Relative: 40 %
Lymphs Abs: 3.1 10*3/uL (ref 0.7–4.0)
MCH: 30 pg (ref 26.0–34.0)
MCHC: 33.5 g/dL (ref 30.0–36.0)
MCV: 89.5 fL (ref 80.0–100.0)
Monocytes Absolute: 0.4 10*3/uL (ref 0.1–1.0)
Monocytes Relative: 5 %
Neutro Abs: 4.2 10*3/uL (ref 1.7–7.7)
Neutrophils Relative %: 53 %
Platelets: 315 10*3/uL (ref 150–400)
RBC: 4.37 MIL/uL (ref 3.87–5.11)
RDW: 18 % — ABNORMAL HIGH (ref 11.5–15.5)
WBC: 7.8 10*3/uL (ref 4.0–10.5)
nRBC: 0 % (ref 0.0–0.2)

## 2023-11-08 LAB — COMPREHENSIVE METABOLIC PANEL WITH GFR
ALT: 22 U/L (ref 0–44)
AST: 38 U/L (ref 15–41)
Albumin: 4.5 g/dL (ref 3.5–5.0)
Alkaline Phosphatase: 134 U/L — ABNORMAL HIGH (ref 38–126)
Anion gap: 13 (ref 5–15)
BUN: 8 mg/dL (ref 6–20)
CO2: 26 mmol/L (ref 22–32)
Calcium: 8.9 mg/dL (ref 8.9–10.3)
Chloride: 102 mmol/L (ref 98–111)
Creatinine, Ser: 0.61 mg/dL (ref 0.44–1.00)
GFR, Estimated: >60 mL/min
Glucose, Bld: 102 mg/dL — ABNORMAL HIGH (ref 70–99)
Potassium: 3.8 mmol/L (ref 3.5–5.1)
Sodium: 141 mmol/L (ref 135–145)
Total Bilirubin: 0.4 mg/dL
Total Protein: 8.4 g/dL — ABNORMAL HIGH (ref 6.5–8.1)

## 2023-11-08 LAB — ETHANOL: Alcohol, Ethyl (B): 331 mg/dL

## 2023-11-08 NOTE — ED Notes (Signed)
Pt refused ankle brace and crutches
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
## 2023-11-08 NOTE — Discharge Instructions (Signed)
Your testing is negative.  Your ankle is not broken.  You may bear weight as tolerated.  Use ice and anti-inflammatories.  Follow-up with your doctor.  Return to the ED with new or worsening symptoms.

## 2023-11-08 NOTE — ED Notes (Signed)
Pt ambulates to and from restroom barefoot at her own insistence with steady gait observed. Dr. Manus Gunning notified of successful roadtest.
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
## 2023-11-09 ENCOUNTER — Emergency Department (HOSPITAL_BASED_OUTPATIENT_CLINIC_OR_DEPARTMENT_OTHER)
Admission: EM | Admit: 2023-11-09 | Discharge: 2023-11-09 | Disposition: A | Discharge status: Home / Self Care | Payer: BLUE CROSS/BLUE SHIELD | Attending: Emergency Medicine | Admitting: Emergency Medicine

## 2023-11-09 ENCOUNTER — Other Ambulatory Visit: Payer: Self-pay

## 2023-11-09 ENCOUNTER — Emergency Department (HOSPITAL_BASED_OUTPATIENT_CLINIC_OR_DEPARTMENT_OTHER): Payer: BLUE CROSS/BLUE SHIELD

## 2023-11-09 ENCOUNTER — Encounter (HOSPITAL_BASED_OUTPATIENT_CLINIC_OR_DEPARTMENT_OTHER): Payer: Self-pay

## 2023-11-09 DIAGNOSIS — S161XXA Strain of muscle, fascia and tendon at neck level, initial encounter: Secondary | ICD-10-CM

## 2023-11-09 DIAGNOSIS — S0990XA Unspecified injury of head, initial encounter: Secondary | ICD-10-CM | POA: Diagnosis present

## 2023-11-09 MED ORDER — ACETAMINOPHEN 500 MG PO TABS
1000.0000 mg | ORAL_TABLET | Freq: Once | ORAL | Status: AC
  Administered 2023-11-09: 1000 mg via ORAL
  Filled 2023-11-09: qty 2

## 2023-11-09 NOTE — Discharge Instructions (Addendum)
 Return to the emergency department if you develop any new and/or concerning issues.
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
## 2023-11-09 NOTE — ED Notes (Signed)
While dressing the pt for discharge, pt has new complaint of left ankle pain. Pt reports it is too painful to walk on. Swelling noted to left ankle. MD notified, xray ordered. Left ankle elevated on pillows and ice pack applied.
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
## 2023-11-09 NOTE — ED Notes (Signed)
Pt transferred to CT via stretcher.

## 2023-11-09 NOTE — ED Notes (Signed)
X-ray at bedside
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
## 2023-11-09 NOTE — ED Notes (Signed)
Pt complaining of bilateral foot pain that she states is her chronic neuropathy. Pt requesting tylenol before discharge. MD notified, tylenol ordered.

## 2023-11-23 ENCOUNTER — Encounter: Payer: Self-pay | Admitting: Physician Assistant

## 2023-11-23 ENCOUNTER — Ambulatory Visit: Payer: BLUE CROSS/BLUE SHIELD | Admitting: Physician Assistant

## 2023-11-23 VITALS — BP 125/80 | HR 93 | Ht 66.0 in | Wt 180.0 lb

## 2023-11-23 DIAGNOSIS — R748 Abnormal levels of other serum enzymes: Secondary | ICD-10-CM | POA: Diagnosis not present

## 2023-11-23 DIAGNOSIS — G6289 Other specified polyneuropathies: Secondary | ICD-10-CM | POA: Diagnosis not present

## 2023-11-23 DIAGNOSIS — F1010 Alcohol abuse, uncomplicated: Secondary | ICD-10-CM | POA: Diagnosis not present

## 2023-11-23 MED ORDER — GABAPENTIN 100 MG PO CAPS
200.0000 mg | ORAL_CAPSULE | Freq: Two times a day (BID) | ORAL | 0 refills | Status: DC
Start: 2023-11-23 — End: 2023-12-07

## 2023-11-23 NOTE — Patient Instructions (Addendum)
You are going to restart your gabapentin at 200 mg twice a day.  I encourage you to keep your feet elevated when able, make sure that you are staying very well-hydrated.  You will return to the mobile unit in 2 weeks for follow-up.  We will call you with today's lab results.  Roney Jaffe, PA-C Physician Assistant [facility] [email or web address]    Peripheral Neuropathy Peripheral neuropathy is a type of nerve damage. It affects nerves that carry signals between the spinal cord and the arms, legs, and the rest of the body (peripheral nerves). It does not affect nerves in the spinal cord or brain. In peripheral neuropathy, one nerve or a group of nerves may be damaged. Peripheral neuropathy is a broad category that includes many specific nerve disorders, like diabetic neuropathy, hereditary neuropathy, and carpal tunnel syndrome. What are the causes? This condition may be caused by: Certain diseases, such as: Diabetes. This is the most common cause of peripheral neuropathy. Autoimmune diseases, such as rheumatoid arthritis and systemic lupus erythematosus. Nerve diseases that are passed from parent to child (inherited). Kidney disease. Thyroid disease. Other causes may include: Nerve injury. Pressure or stress on a nerve that lasts a long time. Lack (deficiency) of B vitamins. This can result from alcoholism, poor diet, or a restricted diet. Infections. Some medicines, such as cancer medicines (chemotherapy). Poisonous (toxic) substances, such as lead and mercury. Too little blood flowing to the legs. In some cases, the cause of this condition is not known. What are the signs or symptoms? Symptoms of this condition depend on which of your nerves is damaged. Symptoms in the legs, hands, and arms can include: Loss of feeling (numbness) in the feet, hands, or both. Tingling in the feet, hands, or both. Burning pain. Very  sensitive skin. Weakness. Not being able to move a part of the body (paralysis). Clumsiness or poor coordination. Muscle twitching. Loss of balance. Symptoms in other parts of the body can include: Not being able to control your bladder. Feeling dizzy. Sexual problems. How is this diagnosed? Diagnosing and finding the cause of peripheral neuropathy can be difficult. Your health care provider will take your medical history and do a physical exam. A neurological exam will also be done. This involves checking things that are affected by your brain, spinal cord, and nerves (nervous system). For example, your health care provider will check your reflexes, how you move, and what you can feel. You may have other tests, such as: Blood tests. Electromyogram (EMG) and nerve conduction tests. These tests check nerve function and how well the nerves are controlling the muscles. Imaging tests, such as a CT scan or MRI, to rule out other causes of your symptoms. Removing a small piece of nerve to be examined in a lab (nerve biopsy). Removing and examining a small amount of the fluid that surrounds the brain and spinal cord (lumbar puncture). How is this treated? Treatment for this condition may involve: Treating the underlying cause of the neuropathy, such as diabetes, kidney disease, or vitamin deficiencies. Stopping medicines that can cause neuropathy, such as chemotherapy. Medicine to help relieve pain. Medicines may include: Prescription or over-the-counter pain medicine. Anti-seizure medicine. Antidepressants. Pain-relieving patches that are applied to painful areas of skin. Surgery to relieve pressure on a nerve or to destroy a nerve that is causing pain. Physical therapy to help improve movement and balance. Devices to help you move around (assistive devices). Follow these instructions at home: Medicines Take over-the-counter  and prescription medicines only as told by your health care  provider. Do not take any other medicines without first asking your health care provider. Ask your health care provider if the medicine prescribed to you requires you to avoid driving or using machinery. Lifestyle  Do not use any products that contain nicotine or tobacco. These products include cigarettes, chewing tobacco, and vaping devices, such as e-cigarettes. Smoking keeps blood from reaching damaged nerves. If you need help quitting, ask your health care provider. Avoid or limit alcohol. Too much alcohol can cause a vitamin B deficiency, and vitamin B is needed for healthy nerves. Eat a healthy diet. This includes: Eating foods that are high in fiber, such as beans, whole grains, and fresh fruits and vegetables. Limiting foods that are high in fat and processed sugars, such as fried or sweet foods. General instructions  If you have diabetes, work closely with your health care provider to keep your blood sugar under control. If you have numbness in your feet: Check every day for signs of injury or infection. Watch for redness, warmth, and swelling. Wear padded socks and comfortable shoes. These help protect your feet. Develop a good support system. Living with peripheral neuropathy can be stressful. Consider talking with a mental health specialist or joining a support group. Use assistive devices and attend physical therapy as told by your health care provider. This may include using a walker or a cane. Keep all follow-up visits. This is important. Where to find more information [employer] of Neurological Disorders: [employer] Contact a health care provider if: You have new signs or symptoms of peripheral neuropathy. You are struggling emotionally from dealing with peripheral neuropathy. Your pain is not well controlled. Get help right away if: You have an injury or infection that is not healing normally. You develop new weakness in an arm or leg. You have fallen or do  so frequently. Summary Peripheral neuropathy is when the nerves in the arms or legs are damaged, resulting in numbness, weakness, or pain. There are many causes of peripheral neuropathy, including diabetes, pinched nerves, vitamin deficiencies, autoimmune disease, and hereditary conditions. Diagnosing and finding the cause of peripheral neuropathy can be difficult. Your health care provider will take your medical history, do a physical exam, and do tests, including blood tests and nerve function tests. Treatment involves treating the underlying cause of the neuropathy and taking medicines to help control pain. Physical therapy and assistive devices may also help. This information is not intended to replace advice given to you by your health care provider. Make sure you discuss any questions you have with your health care provider. Document Revised: 08/13/2021 Document Reviewed: 08/13/2021 Elsevier Patient Education  2024 [employer].
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
## 2023-11-24 ENCOUNTER — Telehealth (HOSPITAL_BASED_OUTPATIENT_CLINIC_OR_DEPARTMENT_OTHER): Payer: Self-pay

## 2023-11-24 LAB — HEPATIC FUNCTION PANEL
ALT: 10 [IU]/L (ref 0–32)
AST: 16 [IU]/L (ref 0–40)
Albumin: 4.1 g/dL (ref 3.8–4.9)
Alkaline Phosphatase: 127 [IU]/L — ABNORMAL HIGH (ref 44–121)
Bilirubin Total: <0.2 mg/dL (ref 0.0–1.2)
Bilirubin, Direct: 0.09 mg/dL (ref 0.00–0.40)
Total Protein: 6.9 g/dL (ref 6.0–8.5)

## 2023-11-24 NOTE — Addendum Note (Signed)
Addended by: Roney Jaffe on: 11/24/2023 10:13 AM   Modules accepted: Orders

## 2023-12-03 ENCOUNTER — Ambulatory Visit (HOSPITAL_BASED_OUTPATIENT_CLINIC_OR_DEPARTMENT_OTHER): Admission: RE | Admit: 2023-12-03 | Payer: BLUE CROSS/BLUE SHIELD | Source: Ambulatory Visit

## 2023-12-07 ENCOUNTER — Ambulatory Visit: Payer: BLUE CROSS/BLUE SHIELD | Admitting: Physician Assistant

## 2023-12-07 ENCOUNTER — Encounter: Payer: Self-pay | Admitting: Physician Assistant

## 2023-12-07 VITALS — BP 122/87 | HR 87 | Ht 66.0 in | Wt 182.0 lb

## 2023-12-07 DIAGNOSIS — G6289 Other specified polyneuropathies: Secondary | ICD-10-CM

## 2023-12-07 DIAGNOSIS — F1010 Alcohol abuse, uncomplicated: Secondary | ICD-10-CM

## 2023-12-07 DIAGNOSIS — R748 Abnormal levels of other serum enzymes: Secondary | ICD-10-CM

## 2023-12-07 DIAGNOSIS — K760 Fatty (change of) liver, not elsewhere classified: Secondary | ICD-10-CM

## 2023-12-07 MED ORDER — GABAPENTIN 100 MG PO CAPS: 200.0000 mg | ORAL_CAPSULE | Freq: Two times a day (BID) | ORAL | 1 refills | Status: DC

## 2023-12-07 NOTE — Patient Instructions (Signed)
We will call you with today's lab results.  We are rescheduling the ultrasound of your liver to be completed.  We will call you with results of that as soon as they are available.  Natalie Jaffe, PA-C Physician Assistant [facility] Medicine [email or web address]   Fatty Liver Disease  The liver converts food into energy, removes toxic material from the blood, makes important proteins, and absorbs necessary vitamins from food. Fatty liver disease occurs when too much fat has built up in your liver cells. Fatty liver disease is also called hepatic steatosis. In many cases, fatty liver disease does not cause symptoms or problems. It is often diagnosed when tests are being done for other reasons. However, over time, fatty liver can cause inflammation that may lead to more serious liver problems, such as scarring of the liver (cirrhosis) and liver failure. Fatty liver is associated with insulin resistance, increased body fat, high blood pressure (hypertension), and high cholesterol. These are features of metabolic syndrome and increase your risk for stroke, diabetes, and heart disease. What are the causes? This condition may be caused by components of metabolic syndrome: Obesity. Insulin resistance. High cholesterol. Other causes: Alcohol abuse. Poor nutrition. Cushing syndrome. Pregnancy. Certain drugs. Poisons. Some viral infections. What increases the risk? You are more likely to develop this condition if you: Abuse alcohol. Are overweight. Have diabetes. Have hepatitis. Have a high triglyceride level. Are pregnant. What are the signs or symptoms? Fatty liver disease often does not cause symptoms. If symptoms do develop, they can include: Fatigue and weakness. Weight loss. Confusion. Nausea, vomiting, or abdominal pain. Yellowing of your skin and the white parts of your eyes (jaundice). Itchy skin. How is this diagnosed? This  condition may be diagnosed by: A physical exam and your medical history. Blood tests. Imaging tests, such as an ultrasound, CT scan, or MRI. A liver biopsy. A small sample of liver tissue is removed using a needle. The sample is then looked at under a microscope. How is this treated? Fatty liver disease is often caused by other health conditions. Treatment for fatty liver may involve medicines and lifestyle changes to manage conditions such as: Alcoholism. High cholesterol. Diabetes. Being overweight or obese. Follow these instructions at home:  Do not drink alcohol. If you have trouble quitting, ask your health care provider how to safely quit with the help of medicine or a supervised program. This is important to keep your condition from getting worse. Eat a healthy diet as told by your health care provider. Ask your health care provider about working with a dietitian to develop an eating plan. Exercise regularly. This can help you lose weight and control your cholesterol and diabetes. Talk to your health care provider about an exercise plan and which activities are best for you. Take over-the-counter and prescription medicines only as told by your health care provider. Keep all follow-up visits. This is important. Contact a health care provider if: You have trouble controlling your: Blood sugar. This is especially important if you have diabetes. Cholesterol. Drinking of alcohol. Get help right away if: You have abdominal pain. You have jaundice. You have nausea and are vomiting. You vomit blood or material that looks like coffee grounds. You have stools that are black, tar-like, or bloody. Summary Fatty liver disease develops when too much fat builds up in the cells of your liver. Fatty liver disease often causes no symptoms or problems. However, over time, fatty liver can cause inflammation that may lead to more  serious liver problems, such as scarring of the liver  (cirrhosis). You are more likely to develop this condition if you abuse alcohol, are pregnant, are overweight, have diabetes, have hepatitis, or have high triglyceride or cholesterol levels. Contact your health care provider if you have trouble controlling your blood sugar, cholesterol, or drinking of alcohol. This information is not intended to replace advice given to you by your health care provider. Make sure you discuss any questions you have with your health care provider. Document Revised: 09/20/2020 Document Reviewed: 09/20/2020 Elsevier Patient Education  2024 [employer].
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
## 2023-12-08 LAB — COMP. METABOLIC PANEL (12)
AST: 23 [IU]/L (ref 0–40)
Albumin: 4.4 g/dL (ref 3.8–4.9)
Alkaline Phosphatase: 166 [IU]/L — ABNORMAL HIGH (ref 44–121)
BUN/Creatinine Ratio: 10 (ref 9–23)
BUN: 9 mg/dL (ref 6–24)
Bilirubin Total: <0.2 mg/dL (ref 0.0–1.2)
Calcium: 9.5 mg/dL (ref 8.7–10.2)
Chloride: 104 mmol/L (ref 96–106)
Creatinine, Ser: 0.88 mg/dL (ref 0.57–1.00)
Globulin, Total: 2.8 g/dL (ref 1.5–4.5)
Glucose: 90 mg/dL (ref 70–99)
Potassium: 4.2 mmol/L (ref 3.5–5.2)
Sodium: 140 mmol/L (ref 134–144)
Total Protein: 7.2 g/dL (ref 6.0–8.5)
eGFR: 76 mL/min/{1.73_m2} (ref >59)

## 2023-12-11 ENCOUNTER — Ambulatory Visit (HOSPITAL_BASED_OUTPATIENT_CLINIC_OR_DEPARTMENT_OTHER): Admission: RE | Admit: 2023-12-11 | Payer: BLUE CROSS/BLUE SHIELD | Source: Ambulatory Visit

## 2023-12-18 ENCOUNTER — Ambulatory Visit (HOSPITAL_BASED_OUTPATIENT_CLINIC_OR_DEPARTMENT_OTHER)
Admission: RE | Admit: 2023-12-18 | Discharge: 2023-12-18 | Disposition: A | Discharge status: Home / Self Care | Payer: BLUE CROSS/BLUE SHIELD | Source: Ambulatory Visit | Attending: Physician Assistant

## 2023-12-18 DIAGNOSIS — R748 Abnormal levels of other serum enzymes: Secondary | ICD-10-CM | POA: Diagnosis present

## 2023-12-28 ENCOUNTER — Encounter: Payer: Self-pay | Admitting: Physician Assistant

## 2023-12-28 ENCOUNTER — Ambulatory Visit: Payer: BLUE CROSS/BLUE SHIELD | Admitting: Physician Assistant

## 2023-12-28 VITALS — BP 107/74 | HR 81 | Ht 66.0 in | Wt 185.0 lb

## 2023-12-28 DIAGNOSIS — K760 Fatty (change of) liver, not elsewhere classified: Secondary | ICD-10-CM | POA: Diagnosis not present

## 2023-12-28 DIAGNOSIS — F1010 Alcohol abuse, uncomplicated: Secondary | ICD-10-CM | POA: Diagnosis not present

## 2023-12-28 DIAGNOSIS — E5111 Dry beriberi: Secondary | ICD-10-CM | POA: Diagnosis not present
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
## 2023-12-29 LAB — B12 AND FOLATE PANEL
Folate: 9.5 ng/mL (ref >3.0)
Vitamin B-12: 344 pg/mL (ref 232–1245)

## 2024-02-08 ENCOUNTER — Encounter: Payer: Self-pay | Admitting: Physician Assistant

## 2024-02-08 ENCOUNTER — Ambulatory Visit: Payer: MEDICAID | Admitting: Physician Assistant

## 2024-02-08 VITALS — BP 105/75 | HR 80 | Temp 97.9°F | Ht 66.0 in | Wt 192.0 lb

## 2024-02-08 DIAGNOSIS — J069 Acute upper respiratory infection, unspecified: Secondary | ICD-10-CM | POA: Diagnosis not present

## 2024-02-08 DIAGNOSIS — Z1152 Encounter for screening for COVID-19: Secondary | ICD-10-CM

## 2024-02-08 DIAGNOSIS — R110 Nausea: Secondary | ICD-10-CM

## 2024-02-08 DIAGNOSIS — Z112 Encounter for screening for other bacterial diseases: Secondary | ICD-10-CM

## 2024-02-08 LAB — POCT RAPID STREP A (OFFICE): Rapid Strep A Screen: NEGATIVE

## 2024-02-08 LAB — POC COVID19 BINAXNOW: SARS Coronavirus 2 Ag: NEGATIVE

## 2024-02-08 MED ORDER — FLUTICASONE PROPIONATE 50 MCG/ACT NA SUSP: 2.0000 | Freq: Every day | NASAL | 6 refills | Status: DC

## 2024-02-08 MED ORDER — BENZONATATE 100 MG PO CAPS: ORAL_CAPSULE | ORAL | 0 refills | Status: DC

## 2024-02-08 MED ORDER — ONDANSETRON 4 MG PO TBDP: 4.0000 mg | ORAL_TABLET | Freq: Three times a day (TID) | ORAL | 0 refills | Status: DC | PRN
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
## 2024-02-08 NOTE — Patient Instructions (Addendum)
VISIT SUMMARY:  Today, you were seen for a headache, cough, and diarrhea that started yesterday. You mentioned feeling unwell the night before your symptoms began and had difficulty eating breakfast, which is unusual for you. Your headache is located on both sides of your eyes, and you developed a cough with phlegm and nasal discharge throughout the day. You also experienced a burning sensation in your nose and diarrhea that started a couple of days before the other symptoms. You have been taking Dimetapp and a cough medication, which has helped somewhat, and you were able to sleep last night with the help of melatonin. You denied having body aches, fevers, or chills, but mentioned being around others who may have been sick.  YOUR PLAN:  -UPPER RESPIRATORY INFECTION: An upper respiratory infection is a common viral infection that affects the nose, throat, and airways. You have symptoms like cough with phlegm, nasal discharge, and sinus pressure. Your COVID and strep tests were negative. Please continue your current cough medication, and I am prescribing Flonase to help with your nasal congestion and sinus pressure.  -GASTROENTERITIS: Gastroenteritis is an inflammation of the stomach and intestines, often causing nausea, vomiting, and diarrhea. You reported nausea and diarrhea but no abdominal pain. I am prescribing Zofran to help with your nausea. If your diarrhea continues, you may consider taking an over-the-counter antidiarrheal medication.  -HEADACHE: Your headache is likely due to sinus congestion. You can take ibuprofen or Tylenol as needed to relieve your headache.  INSTRUCTIONS:  Please follow up if your symptoms do not improve or if they worsen. If your diarrhea persists despite taking over-the-counter medication, or if you experience new symptoms, contact our office.

## 2024-02-09 ENCOUNTER — Encounter: Payer: Self-pay | Admitting: Physician Assistant

## 2024-02-22 ENCOUNTER — Ambulatory Visit: Payer: MEDICAID | Admitting: Physician Assistant

## 2024-02-22 VITALS — BP 105/69 | HR 84 | Ht 67.2 in | Wt 194.0 lb

## 2024-02-22 DIAGNOSIS — F1010 Alcohol abuse, uncomplicated: Secondary | ICD-10-CM

## 2024-02-22 DIAGNOSIS — K760 Fatty (change of) liver, not elsewhere classified: Secondary | ICD-10-CM

## 2024-02-22 DIAGNOSIS — G6289 Other specified polyneuropathies: Secondary | ICD-10-CM | POA: Diagnosis not present

## 2024-02-22 DIAGNOSIS — J302 Other seasonal allergic rhinitis: Secondary | ICD-10-CM

## 2024-02-22 MED ORDER — LORATADINE 10 MG PO TABS: 10.0000 mg | ORAL_TABLET | Freq: Every day | ORAL | 11 refills | Status: DC | PRN

## 2024-02-22 MED ORDER — GABAPENTIN 100 MG PO CAPS
200.0000 mg | ORAL_CAPSULE | Freq: Two times a day (BID) | ORAL | 1 refills | Status: DC
Start: 2024-02-22 — End: 2024-03-07
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
## 2024-02-23 ENCOUNTER — Encounter: Payer: Self-pay | Admitting: Physician Assistant

## 2024-03-07 ENCOUNTER — Encounter: Payer: Self-pay | Admitting: Physician Assistant

## 2024-03-07 ENCOUNTER — Ambulatory Visit: Payer: MEDICAID | Admitting: Physician Assistant

## 2024-03-07 VITALS — BP 122/82 | HR 67 | Temp 97.8°F | Ht 66.0 in | Wt 190.0 lb

## 2024-03-07 DIAGNOSIS — K760 Fatty (change of) liver, not elsewhere classified: Secondary | ICD-10-CM | POA: Diagnosis not present

## 2024-03-07 DIAGNOSIS — J302 Other seasonal allergic rhinitis: Secondary | ICD-10-CM

## 2024-03-07 DIAGNOSIS — G6289 Other specified polyneuropathies: Secondary | ICD-10-CM

## 2024-03-07 DIAGNOSIS — F1010 Alcohol abuse, uncomplicated: Secondary | ICD-10-CM | POA: Diagnosis not present

## 2024-03-07 MED ORDER — LORATADINE 10 MG PO TABS: 10.0000 mg | ORAL_TABLET | Freq: Every day | ORAL | 11 refills | Status: AC | PRN

## 2024-03-07 MED ORDER — GABAPENTIN 100 MG PO CAPS: 200.0000 mg | ORAL_CAPSULE | Freq: Two times a day (BID) | ORAL | 1 refills | Status: DC
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
## 2024-03-07 NOTE — Patient Instructions (Signed)
 VISIT SUMMARY:  During your visit today, we discussed your current health status and addressed your concerns. We clarified that you do not have liver cirrhosis but do have fatty liver disease. We also reviewed your medications and discussed the importance of regular health screenings.  YOUR PLAN:  -FATTY LIVER DISEASE: Fatty liver disease means there is an accumulation of fat in your liver cells, which can lead to liver damage if not managed properly. To prevent progression, it is important to reduce alcohol intake, maintain a healthy weight, and manage any other health conditions like high cholesterol and high blood pressure. We recommend annual liver imaging to monitor your condition.  -ALLERGIC RHINITIS: Allergic rhinitis is an allergic reaction that causes sneezing, congestion, and a runny nose. It is managed with loratadine, which you should take regularly during allergy season to prevent symptoms.  -GENERAL HEALTH MAINTENANCE: You are due for a cervical cancer screening (Pap smear) and a mammogram. Regular screenings are important for early detection and prevention of potential health issues.  INSTRUCTIONS:  Please schedule a Pap smear and a mammogram as soon as possible. Continue taking loratadine regularly during allergy season. Follow the lifestyle recommendations to manage your fatty liver disease and schedule annual liver imaging.

## 2024-07-18 ENCOUNTER — Emergency Department (HOSPITAL_COMMUNITY): Payer: MEDICAID

## 2024-07-18 ENCOUNTER — Emergency Department (HOSPITAL_COMMUNITY)
Admission: EM | Admit: 2024-07-18 | Discharge: 2024-07-19 | Disposition: A | Discharge status: Home / Self Care | Payer: MEDICAID | Attending: Emergency Medicine | Admitting: Emergency Medicine

## 2024-07-18 ENCOUNTER — Other Ambulatory Visit: Payer: Self-pay

## 2024-07-18 DIAGNOSIS — R519 Headache, unspecified: Secondary | ICD-10-CM | POA: Diagnosis not present

## 2024-07-18 DIAGNOSIS — R7401 Elevation of levels of liver transaminase levels: Secondary | ICD-10-CM | POA: Insufficient documentation

## 2024-07-18 DIAGNOSIS — F10120 Alcohol abuse with intoxication, uncomplicated: Secondary | ICD-10-CM | POA: Insufficient documentation

## 2024-07-18 DIAGNOSIS — Y908 Blood alcohol level of 240 mg/100 ml or more: Secondary | ICD-10-CM | POA: Diagnosis not present

## 2024-07-18 DIAGNOSIS — R739 Hyperglycemia, unspecified: Secondary | ICD-10-CM

## 2024-07-18 DIAGNOSIS — R748 Abnormal levels of other serum enzymes: Secondary | ICD-10-CM | POA: Insufficient documentation

## 2024-07-18 DIAGNOSIS — E1165 Type 2 diabetes mellitus with hyperglycemia: Secondary | ICD-10-CM | POA: Diagnosis not present

## 2024-07-18 DIAGNOSIS — F10920 Alcohol use, unspecified with intoxication, uncomplicated: Secondary | ICD-10-CM

## 2024-07-18 LAB — COMPREHENSIVE METABOLIC PANEL WITH GFR
ALT: 22 U/L (ref 0–44)
AST: 46 U/L — ABNORMAL HIGH (ref 15–41)
Albumin: 4.2 g/dL (ref 3.5–5.0)
Alkaline Phosphatase: 157 U/L — ABNORMAL HIGH (ref 38–126)
Anion gap: 10 (ref 5–15)
BUN: 8 mg/dL (ref 6–20)
CO2: 30 mmol/L (ref 22–32)
Calcium: 8.5 mg/dL — ABNORMAL LOW (ref 8.9–10.3)
Chloride: 101 mmol/L (ref 98–111)
Creatinine, Ser: 0.77 mg/dL (ref 0.44–1.00)
GFR, Estimated: >60 mL/min (ref >60)
Glucose, Bld: 102 mg/dL — ABNORMAL HIGH (ref 70–99)
Potassium: 4.5 mmol/L (ref 3.5–5.1)
Sodium: 141 mmol/L (ref 135–145)
Total Bilirubin: 0.7 mg/dL (ref 0.0–1.2)
Total Protein: 8 g/dL (ref 6.5–8.1)

## 2024-07-18 LAB — RAPID URINE DRUG SCREEN, HOSP PERFORMED
Amphetamines: NOT DETECTED
Barbiturates: NOT DETECTED
Benzodiazepines: POSITIVE — AB
Cocaine: NOT DETECTED
Opiates: NOT DETECTED
Tetrahydrocannabinol: NOT DETECTED

## 2024-07-18 LAB — CBC
HCT: 40.1 % (ref 36.0–46.0)
Hemoglobin: 12.7 g/dL (ref 12.0–15.0)
MCH: 27.1 pg (ref 26.0–34.0)
MCHC: 31.7 g/dL (ref 30.0–36.0)
MCV: 85.5 fL (ref 80.0–100.0)
Platelets: 330 K/uL (ref 150–400)
RBC: 4.69 MIL/uL (ref 3.87–5.11)
RDW: 16.8 % — ABNORMAL HIGH (ref 11.5–15.5)
WBC: 7.4 K/uL (ref 4.0–10.5)
nRBC: 0 % (ref 0.0–0.2)

## 2024-07-18 LAB — ETHANOL: Alcohol, Ethyl (B): 304 mg/dL (ref <15)

## 2024-07-18 NOTE — ED Provider Notes (Signed)
 [name] Provider Note   CSN: [id number] Arrival date & time: 07/18/24  8381     Patient presents with: Alcohol  Intoxication   [place] M[facility] is a 58 y.o. female.  {Add pertinent medical, surgical, social history, OB history to HPI:1871} 58 year old female history of alcohol  abuse who presents to the emergency department with intoxication.  Patient was brought in  because she was intoxicated.  Patient is unsure of why she is here.  Tells me that she has been passing out repeatedly.  Thinks that she did hit her head one of the times.  Does appear to be intoxicated.  Says that she typically drinks four 40 ounce beers per day and had symptoms prior to arrival.  Denies other drug use       Prior to Admission medications   Medication Sig Start Date End Date Taking? Authorizing Provider  gabapentin  (NEURONTIN ) 100 MG capsule Take 2 capsules (200 mg total) by mouth 2 (two) times daily. 03/07/24   Mayers, Cari S, PA-C  loratadine  (CLARITIN ) 10 MG tablet Take 1 tablet (10 mg total) by mouth daily as needed for allergies. 03/07/24   Mayers, Cari S, PA-C  sertraline  (ZOLOFT ) 50 MG tablet Take 50 mg by mouth daily.    [provider]    Allergies: Bee venom and Wasp venom    Review of Systems  Updated Vital Signs Ht 5' 7 (1.702 m)   Wt 93.4 kg   LMP 08/31/2013   BMI 32.26 kg/m   Physical Exam Vitals and nursing note reviewed.  Constitutional:      General: She is not in acute distress.    Appearance: She is well-developed.     Comments: Alert and oriented to self and year but not place.  HENT:     Head: Normocephalic and atraumatic.     Comments: Does have some tenderness palpation of the left side of the scalp    Right Ear: External ear normal.     Left Ear: External ear normal.     Nose: Nose normal.  Eyes:     Extraocular Movements: Extraocular movements intact.     Conjunctiva/sclera: Conjunctivae normal.      Pupils: Pupils are equal, round, and reactive to light.  Cardiovascular:     Rate and Rhythm: Normal rate and regular rhythm.     Heart sounds: No murmur heard. Pulmonary:     Effort: Pulmonary effort is normal. No respiratory distress.     Breath sounds: Normal breath sounds.  Musculoskeletal:     Cervical back: Normal range of motion and neck supple.     Right lower leg: No edema.     Left lower leg: No edema.  Skin:    General: Skin is warm and dry.  Neurological:     Mental Status: She is alert.     Comments: No gross cranial nerve deficits.  Moving all 4 extremities equally and has intact sensation light touch  Psychiatric:        Mood and Affect: Mood normal.     (all labs ordered are listed, but only abnormal results are displayed) Labs Reviewed  COMPREHENSIVE METABOLIC PANEL WITH GFR  ETHANOL  CBC  RAPID URINE DRUG SCREEN, HOSP PERFORMED  PROTIME-INR  AMMONIA    EKG: None  Radiology: No results found.  {Document cardiac monitor, telemetry assessment procedure when appropriate:32947} Procedures   Medications Ordered in the ED - No data to display    {  Click here for ABCD2, HEART and other calculators REFRESH Note before signing:1}                              Medical Decision Making Amount and/or Complexity of Data Reviewed Labs: ordered. Radiology: ordered.   ***  {Document critical care time when appropriate  Document review of labs and clinical decision tools ie CHADS2VASC2, etc  Document your independent review of radiology images and any outside records  Document your discussion with family members, caretakers and with consultants  Document social determinants of health affecting pt's care  Document your decision making why or why not admission, treatments were needed:32947:::1}   Final diagnoses:  None    ED Discharge Orders     None

## 2024-07-18 NOTE — ED Triage Notes (Signed)
 Patient to ED by [facility]. Patient friend call concerned about patient alcohol  intake. Patient was found with beer bottles surrounding her and was currently drinking upon arrival of [facility]. Patient friend is attempting to IVC patient. She has HX of cirrhosis, diabetes and neuropathy. Denies SI/HI and denies insulin and gabapentin  for a few months.

## 2024-07-18 NOTE — ED Notes (Signed)
 PMD Brinda called and asked that entry be made that she forgot to input for CIWA score.  Entry made under my name as reported by her.

## 2024-07-19 LAB — PROTIME-INR
INR: 4.3 (ref 0.8–1.2)
INR: 1 (ref 0.8–1.2)
Prothrombin Time: 43.1 s — ABNORMAL HIGH (ref 11.4–15.2)
Prothrombin Time: 13.5 s (ref 11.4–15.2)

## 2024-07-19 NOTE — ED Notes (Signed)
 Patient states she is calling [employer] and they will drop her at the corner where she stays (homeless).  Patient was provided with medical resources that will be available to her today for housing, medical care, clothing and food at a service provided by [employer].  Address was written down for patient and patient taken to lobby in wheelchair.

## 2024-07-19 NOTE — ED Notes (Signed)
 Patient ambulated with use of walker and one person assist.  Patient states she needs her Gabapentin .  She does use a walker at home.

## 2024-07-19 NOTE — ED Notes (Signed)
 Patient sitting up and eating at present time.

## 2024-07-19 NOTE — ED Provider Notes (Signed)
 Care assumed from Dr. Yolande, patient with alcohol  intoxication, waiting for her to sober up. Also, INR is elevated, being repeated.  Repeat INR is normal.  Abnormal initial result was apparently a lab error.  Patient was resting comfortably through the night, has been ambulatory without any difficulty and she is safe for discharge.   Raford Lenis, MD 07/19/24 [phone]

## 2024-07-20 ENCOUNTER — Emergency Department (HOSPITAL_BASED_OUTPATIENT_CLINIC_OR_DEPARTMENT_OTHER)
Admission: EM | Admit: 2024-07-20 | Discharge: 2024-07-20 | Disposition: A | Discharge status: Home / Self Care | Payer: MEDICAID

## 2024-07-20 ENCOUNTER — Encounter (HOSPITAL_BASED_OUTPATIENT_CLINIC_OR_DEPARTMENT_OTHER): Payer: Self-pay

## 2024-07-20 ENCOUNTER — Other Ambulatory Visit: Payer: Self-pay

## 2024-07-20 ENCOUNTER — Emergency Department (HOSPITAL_BASED_OUTPATIENT_CLINIC_OR_DEPARTMENT_OTHER): Payer: MEDICAID

## 2024-07-20 ENCOUNTER — Emergency Department (HOSPITAL_BASED_OUTPATIENT_CLINIC_OR_DEPARTMENT_OTHER)
Admission: EM | Admit: 2024-07-20 | Discharge: 2024-07-20 | Disposition: A | Discharge status: Home / Self Care | Payer: MEDICAID | Attending: Emergency Medicine | Admitting: Emergency Medicine

## 2024-07-20 DIAGNOSIS — R55 Syncope and collapse: Secondary | ICD-10-CM | POA: Diagnosis present

## 2024-07-20 DIAGNOSIS — Y907 Blood alcohol level of 200-239 mg/100 ml: Secondary | ICD-10-CM | POA: Diagnosis not present

## 2024-07-20 DIAGNOSIS — M79606 Pain in leg, unspecified: Secondary | ICD-10-CM | POA: Diagnosis present

## 2024-07-20 DIAGNOSIS — F1090 Alcohol use, unspecified, uncomplicated: Secondary | ICD-10-CM | POA: Insufficient documentation

## 2024-07-20 DIAGNOSIS — W19XXXD Unspecified fall, subsequent encounter: Secondary | ICD-10-CM | POA: Diagnosis not present

## 2024-07-20 DIAGNOSIS — G629 Polyneuropathy, unspecified: Secondary | ICD-10-CM | POA: Insufficient documentation

## 2024-07-20 DIAGNOSIS — S0031XD Abrasion of nose, subsequent encounter: Secondary | ICD-10-CM | POA: Insufficient documentation

## 2024-07-20 LAB — COMPREHENSIVE METABOLIC PANEL WITH GFR
ALT: 26 U/L (ref 0–44)
AST: 46 U/L — ABNORMAL HIGH (ref 15–41)
Albumin: 4.5 g/dL (ref 3.5–5.0)
Alkaline Phosphatase: 188 U/L — ABNORMAL HIGH (ref 38–126)
Anion gap: 13 (ref 5–15)
BUN: 11 mg/dL (ref 6–20)
CO2: 28 mmol/L (ref 22–32)
Calcium: 9.1 mg/dL (ref 8.9–10.3)
Chloride: 100 mmol/L (ref 98–111)
Creatinine, Ser: 0.84 mg/dL (ref 0.44–1.00)
GFR, Estimated: >60 mL/min (ref >60)
Glucose, Bld: 86 mg/dL (ref 70–99)
Potassium: 4.2 mmol/L (ref 3.5–5.1)
Sodium: 142 mmol/L (ref 135–145)
Total Bilirubin: 0.5 mg/dL (ref 0.0–1.2)
Total Protein: 7.6 g/dL (ref 6.5–8.1)

## 2024-07-20 LAB — PRO BRAIN NATRIURETIC PEPTIDE: Pro Brain Natriuretic Peptide: <36 pg/mL (ref <300.0)

## 2024-07-20 LAB — CBC WITH DIFFERENTIAL/PLATELET
Abs Immature Granulocytes: 0.03 K/uL (ref 0.00–0.07)
Basophils Absolute: 0.1 K/uL (ref 0.0–0.1)
Basophils Relative: 1 %
Eosinophils Absolute: 0.1 K/uL (ref 0.0–0.5)
Eosinophils Relative: 1 %
HCT: 36 % (ref 36.0–46.0)
Hemoglobin: 11.6 g/dL — ABNORMAL LOW (ref 12.0–15.0)
Immature Granulocytes: 0 %
Lymphocytes Relative: 24 %
Lymphs Abs: 2.3 K/uL (ref 0.7–4.0)
MCH: 26.9 pg (ref 26.0–34.0)
MCHC: 32.2 g/dL (ref 30.0–36.0)
MCV: 83.5 fL (ref 80.0–100.0)
Monocytes Absolute: 0.4 K/uL (ref 0.1–1.0)
Monocytes Relative: 5 %
Neutro Abs: 6.7 K/uL (ref 1.7–7.7)
Neutrophils Relative %: 69 %
Platelets: 310 K/uL (ref 150–400)
RBC: 4.31 MIL/uL (ref 3.87–5.11)
RDW: 16.5 % — ABNORMAL HIGH (ref 11.5–15.5)
WBC: 9.7 K/uL (ref 4.0–10.5)
nRBC: 0 % (ref 0.0–0.2)

## 2024-07-20 LAB — TROPONIN T, HIGH SENSITIVITY: Troponin T High Sensitivity: <15 ng/L (ref <19)

## 2024-07-20 LAB — MAGNESIUM: Magnesium: 2.3 mg/dL (ref 1.7–2.4)

## 2024-07-20 LAB — ETHANOL: Alcohol, Ethyl (B): 236 mg/dL — ABNORMAL HIGH (ref <15)

## 2024-07-20 MED ORDER — GABAPENTIN 300 MG PO CAPS
300.0000 mg | ORAL_CAPSULE | Freq: Three times a day (TID) | ORAL | 0 refills | Status: DC
  Filled 2024-07-20: qty 90, 30d supply, fill #0

## 2024-07-20 MED ORDER — GABAPENTIN 300 MG PO CAPS: 300.0000 mg | ORAL_CAPSULE | Freq: Three times a day (TID) | ORAL | 0 refills | Status: AC

## 2024-07-20 MED ORDER — GABAPENTIN 300 MG PO CAPS
300.0000 mg | ORAL_CAPSULE | Freq: Once | ORAL | Status: AC
  Administered 2024-07-20: 300 mg via ORAL
  Filled 2024-07-20: qty 1

## 2024-07-20 NOTE — ED Provider Notes (Signed)
 [place] EMERGENCY DEPARTMENT AT [facility] Provider Note   CSN: [id number] Arrival date & time: 07/20/24  1831     Patient presents with: Leg Pain   Natalie Mcdaniel is a 58 y.o. female.   58 year old female who presents for evaluation of leg pain.  She was here earlier in the day for the same systems and states she wants to know what her labs showed.  States she is concerned about her gabapentin  dose as well.  Denies any other concerns or complaints at this time.   Leg Pain Associated symptoms: no back pain and no fever        Prior to Admission medications   Medication Sig Start Date End Date Taking? Authorizing Provider  gabapentin  (NEURONTIN ) 300 MG capsule Take 1 capsule (300 mg total) by mouth 3 (three) times daily. 07/20/24   Charlette Hennings L, DO  loratadine  (CLARITIN ) 10 MG tablet Take 1 tablet (10 mg total) by mouth daily as needed for allergies. 03/07/24   Mayers, Cari S, PA-C  sertraline  (ZOLOFT ) 50 MG tablet Take 50 mg by mouth daily.    [provider]    Allergies: Bee venom and Wasp venom    Review of Systems  Constitutional:  Negative for chills and fever.  HENT:  Negative for ear pain and sore throat.   Eyes:  Negative for pain and visual disturbance.  Respiratory:  Negative for cough and shortness of breath.   Cardiovascular:  Negative for chest pain and palpitations.  Gastrointestinal:  Negative for abdominal pain and vomiting.  Genitourinary:  Negative for dysuria and hematuria.  Musculoskeletal:  Negative for arthralgias and back pain.  Skin:  Negative for color change and rash.  Neurological:  Negative for seizures and syncope.       Admits leg pain and numbness that is chronic  All other systems reviewed and are negative.   Updated Vital Signs BP 125/80 (BP Location: Right Arm)   Pulse 100   Temp 98 F (36.7 C) (Oral)   Resp 18   LMP 08/31/2013   SpO2 97%   Physical Exam Vitals and nursing note reviewed.   Constitutional:      General: She is not in acute distress.    Appearance: She is well-developed.  HENT:     Head: Normocephalic and atraumatic.  Eyes:     Conjunctiva/sclera: Conjunctivae normal.  Cardiovascular:     Rate and Rhythm: Normal rate and regular rhythm.     Heart sounds: No murmur heard. Pulmonary:     Effort: Pulmonary effort is normal. No respiratory distress.     Breath sounds: Normal breath sounds.  Abdominal:     Palpations: Abdomen is soft.     Tenderness: There is no abdominal tenderness.  Musculoskeletal:        General: No swelling.     Cervical back: Neck supple.  Skin:    General: Skin is warm and dry.     Capillary Refill: Capillary refill takes less than 2 seconds.  Neurological:     Mental Status: She is alert.  Psychiatric:        Mood and Affect: Mood normal.     (all labs ordered are listed, but only abnormal results are displayed) Labs Reviewed - No data to display  EKG: None  Radiology: CT Head Wo Contrast Result Date: 07/20/2024 CLINICAL DATA:  Head trauma. Moderate/severe syncope and ETOH use. Falls. EXAM: CT HEAD WITHOUT CONTRAST CT CERVICAL SPINE WITHOUT CONTRAST TECHNIQUE: Multidetector  CT imaging of the head and cervical spine was performed following the standard protocol without intravenous contrast. Multiplanar CT image reconstructions of the cervical spine were also generated. RADIATION DOSE REDUCTION: This exam was performed according to the departmental dose-optimization program which includes automated exposure control, adjustment of the mA and/or kV according to patient size and/or use of iterative reconstruction technique. COMPARISON:  07/18/2024 FINDINGS: CT HEAD FINDINGS Brain: Ventricles, cisterns and other CSF spaces are normal. There is no mass, mass effect, shift of midline structures or acute hemorrhage. No evidence of acute infarction. Bilateral basal ganglia calcifications. Vascular: No hyperdense vessel or unexpected  calcification. Skull: Normal. Negative for fracture or focal lesion. Sinuses/Orbits: No acute finding. Other: None. CT CERVICAL SPINE FINDINGS Alignment: Normal. Skull base and vertebrae: Vertebral body heights are normal. There is mild spondylosis throughout the cervical spine to include uncovertebral joint spurring. No acute fracture. Minimal bilateral neural foraminal narrowing at the C3-4 level, right-sided neural foraminal narrowing at the C4-5 level and minimal bilateral neural foraminal narrowing at the C5-6 level. Soft tissues and spinal canal: Minimal narrowing of the AP diameter of the canal at the C4-5 and C5-6 levels due to posterior spurring and possible broad-based disc bulge. Prevertebral soft tissues are normal. Disc levels:  Disc space narrowing at the C5-6 level. Upper chest: No acute findings. Other: None. IMPRESSION: CT HEAD: No acute intracranial findings. CT CERVICAL SPINE: 1. No acute fracture or subluxation. 2. Mild spondylosis throughout the cervical spine with minimal bilateral neural foraminal narrowing at the C3-4 level, right-sided neural foraminal narrowing at the C4-5 level and minimal bilateral neural foraminal narrowing at the C5-6 level. Minimal narrowing of the AP diameter of the canal at the C4-5 and C5-6 levels due to posterior spurring and possible broad-based disc bulge. Electronically Signed   By: Toribio Agreste M.D.   On: 07/20/2024 09:58   CT Cervical Spine Wo Contrast Result Date: 07/20/2024 CLINICAL DATA:  Head trauma. Moderate/severe syncope and ETOH use. Falls. EXAM: CT HEAD WITHOUT CONTRAST CT CERVICAL SPINE WITHOUT CONTRAST TECHNIQUE: Multidetector CT imaging of the head and cervical spine was performed following the standard protocol without intravenous contrast. Multiplanar CT image reconstructions of the cervical spine were also generated. RADIATION DOSE REDUCTION: This exam was performed according to the departmental dose-optimization program which includes  automated exposure control, adjustment of the mA and/or kV according to patient size and/or use of iterative reconstruction technique. COMPARISON:  07/18/2024 FINDINGS: CT HEAD FINDINGS Brain: Ventricles, cisterns and other CSF spaces are normal. There is no mass, mass effect, shift of midline structures or acute hemorrhage. No evidence of acute infarction. Bilateral basal ganglia calcifications. Vascular: No hyperdense vessel or unexpected calcification. Skull: Normal. Negative for fracture or focal lesion. Sinuses/Orbits: No acute finding. Other: None. CT CERVICAL SPINE FINDINGS Alignment: Normal. Skull base and vertebrae: Vertebral body heights are normal. There is mild spondylosis throughout the cervical spine to include uncovertebral joint spurring. No acute fracture. Minimal bilateral neural foraminal narrowing at the C3-4 level, right-sided neural foraminal narrowing at the C4-5 level and minimal bilateral neural foraminal narrowing at the C5-6 level. Soft tissues and spinal canal: Minimal narrowing of the AP diameter of the canal at the C4-5 and C5-6 levels due to posterior spurring and possible broad-based disc bulge. Prevertebral soft tissues are normal. Disc levels:  Disc space narrowing at the C5-6 level. Upper chest: No acute findings. Other: None. IMPRESSION: CT HEAD: No acute intracranial findings. CT CERVICAL SPINE: 1. No acute fracture or subluxation.  2. Mild spondylosis throughout the cervical spine with minimal bilateral neural foraminal narrowing at the C3-4 level, right-sided neural foraminal narrowing at the C4-5 level and minimal bilateral neural foraminal narrowing at the C5-6 level. Minimal narrowing of the AP diameter of the canal at the C4-5 and C5-6 levels due to posterior spurring and possible broad-based disc bulge. Electronically Signed   By: Toribio Agreste M.D.   On: 07/20/2024 09:58   DG Chest Portable 1 View Result Date: 07/20/2024 CLINICAL DATA:  Syncope. EXAM: PORTABLE CHEST 1  VIEW COMPARISON:  11/24/2022. FINDINGS: The heart size and mediastinal contours are within normal limits. Both lungs are clear. No pleural effusion or pneumothorax. No acute osseous abnormality. IMPRESSION: No acute cardiopulmonary findings. Electronically Signed   By: Harrietta Sherry M.D.   On: 07/20/2024 09:51     Procedures   Medications Ordered in the ED  gabapentin  (NEURONTIN ) capsule 300 mg (300 mg Oral Given 07/20/24 2230)                                    Medical Decision Making Patient here for neuropathy.  She is here to discuss her labs from earlier.  I went over all lab results with her as well as her imaging results.  Will increase her gabapentin  to 300 mg 3 times daily as she states this dose has been working for her in the past for her neuropathy.  Advise close follow-up with primary care doctor.  She feels comfortable being discharged.  Patient has a history of alcohol  abuse and homelessness.  Problems Addressed: Neuropathy: chronic illness or injury  Amount and/or Complexity of Data Reviewed External Data Reviewed: notes.    Details: Prior ED records from today reviewed and patient had negative lab workup and CT scans of her C-spine and head  Risk OTC drugs. Prescription drug management. Diagnosis or treatment significantly limited by social determinants of health.     Final diagnoses:  Neuropathy    ED Discharge Orders          Ordered    gabapentin  (NEURONTIN ) 300 MG capsule  3 times daily,   Status:  Discontinued        07/20/24 2218    gabapentin  (NEURONTIN ) 300 MG capsule  3 times daily        07/20/24 2226               Gennaro Duwaine CROME, DO 07/20/24 2241

## 2024-07-20 NOTE — ED Notes (Addendum)
 Patient brought in via EMS. She reports falls and syncope recently. She says she has neuropathy and has not been taking her gabapentin  as she should. She reports she usually uses a walker at home. She also reports being here recently. She reports hitting her head but denies anticoagulants.

## 2024-07-20 NOTE — ED Triage Notes (Addendum)
 Patient brought in via EMS after falling. She reports that was here earlier for the same thing but needs better resources. She states she is unable to get her medication and because of this she is not able to deal her neuropathy pain. She says because of this pain she falls. She reports she think she hit her head after all. She has no neurological deficits in triage.

## 2024-07-20 NOTE — ED Provider Notes (Signed)
 [place] EMERGENCY DEPARTMENT AT [facility] Provider Note   CSN: [id number] Arrival date & time: 07/20/24  9088     Patient presents with: Loss of Consciousness and Fall   Natalie Mcdaniel is a 58 y.o. female.   Patient with history of neuropathy on gabapentin , prediabetes, history of alcohol  abuse, last seen in the emergency department 2 days ago, normal B12 and folate January 2025 --presents to the emergency department by EMS for syncope.  Patient had a very similar presentation 2 days ago.  Head and neck imaging, lab workup without concerning findings other than excessively high alcohol .  Patient presents today saying that she passed out again.  She states that she hit her head.  She seems very concerned about her repetitive syncope spells.  Denies history of arrhythmia.  No associated chest pain or shortness of breath.  No known history of heart failure.  Patient states that she falls due to neuropathy.  Patient tells me that she has not had any alcohol  since ED visit.  She denies nausea, vomiting, diarrhea, palpitations related to withdrawals.       Prior to Admission medications   Medication Sig Start Date End Date Taking? Authorizing Provider  gabapentin  (NEURONTIN ) 100 MG capsule Take 2 capsules (200 mg total) by mouth 2 (two) times daily. 03/07/24   Mayers, Cari S, PA-C  loratadine  (CLARITIN ) 10 MG tablet Take 1 tablet (10 mg total) by mouth daily as needed for allergies. 03/07/24   Mayers, Cari S, PA-C  sertraline  (ZOLOFT ) 50 MG tablet Take 50 mg by mouth daily.    [provider]    Allergies: Bee venom and Wasp venom    Review of Systems  Updated Vital Signs BP 123/74 (BP Location: Right Arm)   Pulse 77   Temp 98 F (36.7 C) (Oral)   Resp 18   LMP 08/31/2013   SpO2 97%   Physical Exam Vitals and nursing note reviewed.  Constitutional:      Appearance: She is well-developed. She is not diaphoretic.     Comments: Patient is unkempt.  She is  wearing scrub pants from previous visit.  She has socks from her previous ED visit on which are wet and dirty.  HENT:     Head: Normocephalic.     Comments: Patient has healing abrasion noted below her nose    Right Ear: External ear normal.     Left Ear: External ear normal.     Nose: Nose normal.     Mouth/Throat:     Mouth: Mucous membranes are moist. Mucous membranes are not dry.  Eyes:     Conjunctiva/sclera: Conjunctivae normal.  Neck:     Vascular: Normal carotid pulses. No JVD.     Trachea: Trachea normal. No tracheal deviation.  Cardiovascular:     Rate and Rhythm: Normal rate and regular rhythm.     Pulses: No decreased pulses.          Radial pulses are 2+ on the right side and 2+ on the left side.     Heart sounds: Normal heart sounds, S1 normal and S2 normal. No murmur heard. Pulmonary:     Effort: Pulmonary effort is normal. No respiratory distress.     Breath sounds: No wheezing.  Chest:     Chest wall: No tenderness.  Abdominal:     General: Bowel sounds are normal.     Palpations: Abdomen is soft.     Tenderness: There is no abdominal tenderness.  There is no guarding or rebound.  Musculoskeletal:        General: Normal range of motion.     Cervical back: Normal range of motion and neck supple. No tenderness. No muscular tenderness. Normal range of motion.     Thoracic back: No tenderness. Normal range of motion.     Lumbar back: No tenderness. Normal range of motion.  Skin:    General: Skin is warm and dry.     Coloration: Skin is not pale.  Neurological:     General: No focal deficit present.     Mental Status: She is alert and oriented to person, place, and time.     Cranial Nerves: No cranial nerve deficit.     Motor: No weakness.     Comments: Patient conversant, no tremors or shakes, gross coordination seems normal.     (all labs ordered are listed, but only abnormal results are displayed) Labs Reviewed  CBC WITH DIFFERENTIAL/PLATELET   COMPREHENSIVE METABOLIC PANEL WITH GFR  PRO BRAIN NATRIURETIC PEPTIDE  ETHANOL  TROPONIN T, HIGH SENSITIVITY    EKG: None  Radiology: CT Head Wo Contrast Result Date: 07/18/2024 CLINICAL DATA:  Head trauma, neck trauma, large alcohol  intake. EXAM: CT HEAD WITHOUT CONTRAST CT CERVICAL SPINE WITHOUT CONTRAST TECHNIQUE: Multidetector CT imaging of the head and cervical spine was performed following the standard protocol without intravenous contrast. Multiplanar CT image reconstructions of the cervical spine were also generated. RADIATION DOSE REDUCTION: This exam was performed according to the departmental dose-optimization program which includes automated exposure control, adjustment of the mA and/or kV according to patient size and/or use of iterative reconstruction technique. COMPARISON:  11/09/2023 FINDINGS: CT HEAD FINDINGS Brain: No acute intracranial hemorrhage. No CT evidence of acute infarct. No edema, mass effect, or midline shift. The basilar cisterns are patent. Ventricles: The ventricles are normal. Vascular: No hyperdense vessel or unexpected calcification. Skull: No acute or aggressive finding. Orbits: Orbits are symmetric. Sinuses: The visualized paranasal sinuses are clear. Other: Mastoid air cells are clear. CT CERVICAL SPINE FINDINGS Alignment: Straightening and slight reversal normal cervical lordosis. No listhesis. No facet subluxation or dislocation. Skull base and vertebrae: No acute fracture. No primary bone lesion or focal pathologic process. Soft tissues and spinal canal: No prevertebral fluid or swelling. No visible canal hematoma. Disc levels: Intervertebral disc spaces are relatively maintained. Degenerative endplate osteophytes at multiple levels. Small disc bulges at multiple levels. No high-grade osseous spinal canal stenosis. Facet arthrosis at multiple levels. Upper chest: Negative. Other: None. IMPRESSION: No CT evidence of acute intracranial abnormality. No acute  fracture or traumatic malalignment of the cervical spine. Electronically Signed   By: Donnice Mania M.D.   On: 07/18/2024 20:45   CT Cervical Spine Wo Contrast Result Date: 07/18/2024 CLINICAL DATA:  Head trauma, neck trauma, large alcohol  intake. EXAM: CT HEAD WITHOUT CONTRAST CT CERVICAL SPINE WITHOUT CONTRAST TECHNIQUE: Multidetector CT imaging of the head and cervical spine was performed following the standard protocol without intravenous contrast. Multiplanar CT image reconstructions of the cervical spine were also generated. RADIATION DOSE REDUCTION: This exam was performed according to the departmental dose-optimization program which includes automated exposure control, adjustment of the mA and/or kV according to patient size and/or use of iterative reconstruction technique. COMPARISON:  11/09/2023 FINDINGS: CT HEAD FINDINGS Brain: No acute intracranial hemorrhage. No CT evidence of acute infarct. No edema, mass effect, or midline shift. The basilar cisterns are patent. Ventricles: The ventricles are normal. Vascular: No hyperdense vessel or unexpected  calcification. Skull: No acute or aggressive finding. Orbits: Orbits are symmetric. Sinuses: The visualized paranasal sinuses are clear. Other: Mastoid air cells are clear. CT CERVICAL SPINE FINDINGS Alignment: Straightening and slight reversal normal cervical lordosis. No listhesis. No facet subluxation or dislocation. Skull base and vertebrae: No acute fracture. No primary bone lesion or focal pathologic process. Soft tissues and spinal canal: No prevertebral fluid or swelling. No visible canal hematoma. Disc levels: Intervertebral disc spaces are relatively maintained. Degenerative endplate osteophytes at multiple levels. Small disc bulges at multiple levels. No high-grade osseous spinal canal stenosis. Facet arthrosis at multiple levels. Upper chest: Negative. Other: None. IMPRESSION: No CT evidence of acute intracranial abnormality. No acute fracture or  traumatic malalignment of the cervical spine. Electronically Signed   By: Donnice Mania M.D.   On: 07/18/2024 20:45     Procedures   Medications Ordered in the ED - No data to display  ED Course  Patient seen and examined. History obtained directly from patient.  Reviewed most recent ED visit including labs and imaging.  I cannot see the EKG from previous.  Patient is concerned about her recurrent episodes of syncope.  This was thought previously due to alcohol  intoxication, and may be do so, however given reported fall, question of intoxication, repeat visits for syncope --will look at other lab work today.  Labs/EKG: Ordered CBC, CMP.  PT/INR previous visit initially elevated, likely error, was rechecked and normal.  Do not feel that this needs to be rechecked today.  Given syncope, will screen for cardiac cause and congestive heart failure with troponin x 1 and BNP.  Patient states that she has not been drinking alcohol  recently, will confirm with alcohol  level.  Will check magnesium  as well.  I do not think that it would be very helpful to check folate and B12 as patient had this done about 6 months ago.  She is not altered.  Will check EKG and place on cardiac monitoring.  Imaging: Given concern for possible intoxication, reported fall and head injury, albeit mild --feel obligated to CT head and neck to ensure no traumatic injuries given this high risk patient.  Given syncope, will check chest x-ray to evaluate for signs of failure or cardiac enlargement.  Medications/Fluids: None ordered, vital signs appear normal.  Most recent vital signs reviewed and are as follows: BP 123/74 (BP Location: Right Arm)   Pulse 77   Temp 98 F (36.7 C) (Oral)   Resp 18   LMP 08/31/2013   SpO2 97%   Initial impression: Social determinants health being affected by chronic alcohol  use in this scenario.  Will evaluate for any concerning cardiac abnormality today, electrolyte imbalance, signs of  ischemia, although I think this is unlikely.  Will screen for traumatic injury given risk profile.  If workup found to be reassuring, in conjunction with previous workup, do not feel that patient would require admission.  She does not appear to be actively withdrawing at this time.  No vomiting, diarrhea, tachycardia.  10:11 AM CT imaging and CXR reviewed. No concerning signs of injury from trauma.  Radiology does note minimal AP diameter narrowing at the C4-5 and C5-6 levels.  Do not think that this would likely be the cause of the patient's lower extremity neuropathy symptoms, given that it is minimal. Low concern for acute cervical myelopathy or other cervical cord problem. Her complaint is sensory, not progressive weakness.   12:29 PM Reassessment performed. Patient appears stable during ED stay.  Personally  reviewed telemetry, no episodes of arrhythmia.  Labs personally reviewed and interpreted including: CBC mildly low hemoglobin otherwise unremarkable; CMP unremarkable; troponin less than 15; BNP less than 36; magnesium  normal; alcohol  elevated at 236.  Reviewed pertinent lab work and imaging with patient at bedside. Questions answered.   Most current vital signs reviewed and are as follows: BP (!) 102/57   Pulse 81   Temp 98 F (36.7 C) (Oral)   Resp 13   LMP 08/31/2013   SpO2 92%   Plan: Discharge to home.   Prescriptions written for: None  Other home care instructions discussed: Working on cutting back alcohol   ED return instructions discussed: No worsening symptoms, additional episodes of syncope, chest pain, shortness of breath.  Follow-up instructions discussed: Patient encouraged to follow-up with their PCP when able.                                    Medical Decision Making Amount and/or Complexity of Data Reviewed Labs: ordered. Radiology: ordered.   Patient presents for continued spells she describes as passing out.  This is in setting of heavy alcohol  use.   This is ongoing.  Patient with visit for similar 2 days ago.  Workup today was very reassuring.  Focus more on cardiac labs today including troponin and BNP.  These were negative.  Consistent with no significant heart failure.  Alcohol  remains elevated despite patient stating that she recently quit.  No concern clinically for withdrawals at this time.  Electrolytes normal.  CT and x-ray negative for traumatic injury.  A lot of the patient's problems are likely being driven by her ongoing alcohol  use.  EKG without prolonged QT, WPW, Brugada syndrome, heart block or other arrhythmia.  Social determinants of care affected by substance use, poor access to care, unstable housing.     Final diagnoses:  Syncope, unspecified syncope type  Alcohol  use disorder    ED Discharge Orders     None          Desiderio Chew, PA-C 07/20/24 1232    Lenor Hollering, MD 07/21/24 [phone]

## 2024-07-20 NOTE — Discharge Instructions (Addendum)
 Please read and follow all provided instructions.  Your diagnoses today include:  1. Syncope, unspecified syncope type   2. Alcohol  use disorder     Tests performed today include: An EKG of your heart: No normal heart rhythms A chest x-ray: Was normal Cardiac enzymes - a blood test for heart muscle damage, was normal Blood counts and electrolytes: Showed mildly low red blood cell count Screening test for heart failure was normal CT scan of your head and cervical spine did not show any signs of severe injury Vital signs. See below for your results today.   Medications prescribed:  None  Take any prescribed medications only as directed.  Follow-up instructions: Please follow-up with your primary care provider as soon as you can for further evaluation of your symptoms.   Return instructions:  SEEK IMMEDIATE MEDICAL ATTENTION IF: You have severe chest pain, especially if the pain is crushing or pressure-like and spreads to the arms, back, neck, or jaw, or if you have sweating, nausea or vomiting, or trouble with breathing. THIS IS AN EMERGENCY. Do not wait to see if the pain will go away. Get medical help at once. Call 911. DO NOT drive yourself to the hospital.  Your chest pain gets worse and does not go away after a few minutes of rest.  You have an attack of chest pain lasting longer than what you usually experience.  You have significant dizziness, if you pass out, or have trouble walking.  You have chest pain not typical of your usual pain for which you originally saw your caregiver.  You have any other emergent concerns regarding your health.  Your vital signs today were: BP (!) 102/57   Pulse 81   Temp 98 F (36.7 C) (Oral)   Resp 13   LMP 08/31/2013   SpO2 92%  If your blood pressure (BP) was elevated above 135/85 this visit, please have this repeated by your doctor within one month. --------------

## 2024-07-20 NOTE — Discharge Instructions (Addendum)
 Follow up with your primary care provider. Consider decreasing and discontinuing your alcohol  usage. You can take gabapentin  three times a day as needed for neuropathy.

## 2024-07-20 NOTE — ED Notes (Signed)
 Pt AO x4... Arrived in purple scrubs... jacket and purse placed in a pt bag... Pt unable to find blue jeans pants... Unable to locate the blue jeans... Pt unsure if she had them upon arrival to facility... DC paperwork given and verbally understood.... Pt stated that she was going to call for an [employer] to transport her home.SABRASABRA

## 2024-07-21 ENCOUNTER — Other Ambulatory Visit (HOSPITAL_COMMUNITY): Payer: Self-pay

## 2024-07-21 ENCOUNTER — Emergency Department (HOSPITAL_BASED_OUTPATIENT_CLINIC_OR_DEPARTMENT_OTHER)
Admission: EM | Admit: 2024-07-21 | Discharge: 2024-07-21 | Disposition: A | Discharge status: Home / Self Care | Payer: MEDICAID | Attending: Emergency Medicine | Admitting: Emergency Medicine

## 2024-07-21 ENCOUNTER — Encounter (HOSPITAL_BASED_OUTPATIENT_CLINIC_OR_DEPARTMENT_OTHER): Payer: Self-pay

## 2024-07-21 DIAGNOSIS — Z765 Malingerer [conscious simulation]: Secondary | ICD-10-CM | POA: Insufficient documentation

## 2024-07-21 NOTE — ED Triage Notes (Addendum)
 Pt coming in for the 2nd time tonight. Complaints of disorientation, tingling in feet, nausea and dry heaves . States 'she feels like she's about to die'.

## 2024-07-21 NOTE — Discharge Instructions (Signed)
 The emergency room is meant to rule out acute emergent or life-threatening conditions.  You have been seen multiple times in the last several days and have abnormal lab work and CT imaging.  See attached resources for shelters.
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
## 2024-07-23 ENCOUNTER — Emergency Department (HOSPITAL_COMMUNITY)
Admission: EM | Admit: 2024-07-23 | Discharge: 2024-07-23 | Disposition: A | Discharge status: Home / Self Care | Payer: MEDICAID | Attending: Emergency Medicine | Admitting: Emergency Medicine

## 2024-07-23 ENCOUNTER — Other Ambulatory Visit: Payer: Self-pay

## 2024-07-23 DIAGNOSIS — R531 Weakness: Secondary | ICD-10-CM | POA: Insufficient documentation

## 2024-07-23 LAB — CBC WITH DIFFERENTIAL/PLATELET
Abs Immature Granulocytes: 0.01 K/uL (ref 0.00–0.07)
Basophils Absolute: 0 K/uL (ref 0.0–0.1)
Basophils Relative: 0 %
Eosinophils Absolute: 0.1 K/uL (ref 0.0–0.5)
Eosinophils Relative: 1 %
HCT: 36.5 % (ref 36.0–46.0)
Hemoglobin: 11.9 g/dL — ABNORMAL LOW (ref 12.0–15.0)
Immature Granulocytes: 0 %
Lymphocytes Relative: 18 %
Lymphs Abs: 1.8 K/uL (ref 0.7–4.0)
MCH: 27.2 pg (ref 26.0–34.0)
MCHC: 32.6 g/dL (ref 30.0–36.0)
MCV: 83.3 fL (ref 80.0–100.0)
Monocytes Absolute: 0.4 K/uL (ref 0.1–1.0)
Monocytes Relative: 4 %
Neutro Abs: 7.4 K/uL (ref 1.7–7.7)
Neutrophils Relative %: 77 %
Platelets: 342 K/uL (ref 150–400)
RBC: 4.38 MIL/uL (ref 3.87–5.11)
RDW: 16.5 % — ABNORMAL HIGH (ref 11.5–15.5)
WBC: 9.8 K/uL (ref 4.0–10.5)
nRBC: 0 % (ref 0.0–0.2)

## 2024-07-23 LAB — BASIC METABOLIC PANEL WITH GFR
Anion gap: 16 — ABNORMAL HIGH (ref 5–15)
BUN: 11 mg/dL (ref 6–20)
CO2: 22 mmol/L (ref 22–32)
Calcium: 8.9 mg/dL (ref 8.9–10.3)
Chloride: 100 mmol/L (ref 98–111)
Creatinine, Ser: 0.68 mg/dL (ref 0.44–1.00)
GFR, Estimated: >60 mL/min (ref >60)
Glucose, Bld: 98 mg/dL (ref 70–99)
Potassium: 4.7 mmol/L (ref 3.5–5.1)
Sodium: 138 mmol/L (ref 135–145)

## 2024-07-23 LAB — ETHANOL: Alcohol, Ethyl (B): 152 mg/dL — ABNORMAL HIGH (ref <15)

## 2024-07-23 NOTE — ED Triage Notes (Signed)
 Pt BIB [facility] from [facility]. Pt reports feeling faint for pass two days. Pt Hx diabetes. Last hospitalized 7/21. Pt smells like urine upon arrival. Pt aox3 with ems  134/86 99HR 96% RA 171 CBG

## 2024-07-23 NOTE — ED Provider Notes (Signed)
 [place] EMERGENCY DEPARTMENT AT [facility] Provider Note   CSN: [id number] Arrival date & time: 07/23/24  0515     History Chief Complaint  Patient presents with   Weakness    HPI Natalie Mcdaniel is a 58 y.o. female presenting for lightheadedness. Brought in by EMS stating that she feels like she is going to pass out. Fourth visit over the past 3 days for similar symptoms. States that she does not feel well but cannot describe it any further.  States she has not been drinking alcohol  since her last discharge.   Patient's recorded medical, surgical, social, medication list and allergies were reviewed in the Snapshot window as part of the initial history.   Review of Systems   Review of Systems  Constitutional:  Positive for fatigue. Negative for chills and fever.  HENT:  Negative for ear pain and sore throat.   Eyes:  Negative for pain and visual disturbance.  Respiratory:  Negative for cough and shortness of breath.   Cardiovascular:  Negative for chest pain and palpitations.  Gastrointestinal:  Negative for abdominal pain and vomiting.  Genitourinary:  Negative for dysuria and hematuria.  Musculoskeletal:  Negative for arthralgias and back pain.  Skin:  Negative for color change and rash.  Neurological:  Positive for weakness and light-headedness. Negative for seizures and syncope.  All other systems reviewed and are negative.   Physical Exam Updated Vital Signs BP 131/80   Pulse 88   Temp 98.5 F (36.9 C) (Oral)   Resp 17   LMP 08/31/2013   SpO2 99%  Physical Exam Vitals and nursing note reviewed.  Constitutional:      General: She is not in acute distress.    Appearance: She is well-developed.  HENT:     Head: Normocephalic and atraumatic.  Eyes:     Conjunctiva/sclera: Conjunctivae normal.  Cardiovascular:     Rate and Rhythm: Normal rate and regular rhythm.     Heart sounds: No murmur heard. Pulmonary:     Effort: Pulmonary effort is  normal. No respiratory distress.     Breath sounds: Normal breath sounds.  Abdominal:     Palpations: Abdomen is soft.     Tenderness: There is no abdominal tenderness.  Musculoskeletal:        General: No swelling.     Cervical back: Neck supple.  Skin:    General: Skin is warm and dry.     Capillary Refill: Capillary refill takes less than 2 seconds.  Neurological:     Mental Status: She is alert.  Psychiatric:        Mood and Affect: Mood normal.      ED Course/ Medical Decision Making/ A&P    Procedures Procedures   Medications Ordered in ED Medications - No data to display  Medical Decision Making:    Is a 58 year old female with a myriad of complaints. These are chronic in nature as documented by her other visits earlier this week. No acute symptoms at this time. She is in no acute distress on my assessment.  Her history of present illness and physical exam findings are most consistent with active intoxication. Considered DKA, CVA, ACS considerably less likely given her serial evaluations over the past few days. EKG performed and reviewed without acute findings. Lab work demonstrated an anion gap and an ethanol level of 150.  Otherwise no acute findings. Patient is ambulatory in no acute distress.  Patient was informed of her findings today.  Vital signs reviewed stable.  Patient strongly recommended to follow-up with [facility] behavioral health for longitudinal management of her substance use and primary care outpatient follow-up for her intermittent lightheadedness.  Disposition:  I have considered need for hospitalization, however, considering all of the above, I believe this patient is stable for discharge at this time.  Patient/family educated about specific return precautions for given chief complaint and symptoms.  Patient/family educated about follow-up with PCP.     Patient/family expressed understanding of return precautions and need for follow-up.  Patient spoken to regarding all imaging and laboratory results and appropriate follow up for these results. All education provided in verbal form with additional information in written form. Time was allowed for answering of patient questions. Patient discharged.    Emergency Department Medication Summary:   Medications - No data to display       Clinical Impression:  1. Weakness      Discharge   Final Clinical Impression(s) / ED Diagnoses Final diagnoses:  Weakness    Rx / DC Orders ED Discharge Orders     None         Jerral Meth, MD 07/23/24 [phone]
